# Patient Record
Sex: Male | Born: 1940 | Race: White | Hispanic: No | Smoking: Former smoker
Health system: Southern US, Community
[De-identification: ages and names within clinical notes are randomized; demographics above are authoritative.]

## PROBLEM LIST (undated history)

## (undated) DIAGNOSIS — Z86010 Personal history of colonic polyps: Secondary | ICD-10-CM

## (undated) DIAGNOSIS — Z9189 Other specified personal risk factors, not elsewhere classified: Secondary | ICD-10-CM

## (undated) DIAGNOSIS — R0609 Other forms of dyspnea: Secondary | ICD-10-CM

## (undated) DIAGNOSIS — H9190 Unspecified hearing loss, unspecified ear: Secondary | ICD-10-CM

## (undated) DIAGNOSIS — Z8673 Personal history of transient ischemic attack (TIA), and cerebral infarction without residual deficits: Secondary | ICD-10-CM

## (undated) DIAGNOSIS — R0600 Dyspnea, unspecified: Secondary | ICD-10-CM

## (undated) DIAGNOSIS — N400 Enlarged prostate without lower urinary tract symptoms: Secondary | ICD-10-CM

## (undated) DIAGNOSIS — K5909 Other constipation: Secondary | ICD-10-CM

## (undated) DIAGNOSIS — D509 Iron deficiency anemia, unspecified: Secondary | ICD-10-CM

## (undated) DIAGNOSIS — C61 Malignant neoplasm of prostate: Secondary | ICD-10-CM

## (undated) DIAGNOSIS — I5032 Chronic diastolic (congestive) heart failure: Secondary | ICD-10-CM

## (undated) DIAGNOSIS — Z7901 Long term (current) use of anticoagulants: Secondary | ICD-10-CM

## (undated) DIAGNOSIS — R339 Retention of urine, unspecified: Secondary | ICD-10-CM

## (undated) DIAGNOSIS — I4892 Unspecified atrial flutter: Secondary | ICD-10-CM

## (undated) DIAGNOSIS — M1990 Unspecified osteoarthritis, unspecified site: Secondary | ICD-10-CM

## (undated) DIAGNOSIS — N183 Chronic kidney disease, stage 3 unspecified: Secondary | ICD-10-CM

## (undated) DIAGNOSIS — R600 Localized edema: Secondary | ICD-10-CM

## (undated) DIAGNOSIS — I452 Bifascicular block: Secondary | ICD-10-CM

## (undated) DIAGNOSIS — L719 Rosacea, unspecified: Secondary | ICD-10-CM

## (undated) DIAGNOSIS — E538 Deficiency of other specified B group vitamins: Secondary | ICD-10-CM

## (undated) DIAGNOSIS — H409 Unspecified glaucoma: Secondary | ICD-10-CM

## (undated) DIAGNOSIS — R413 Other amnesia: Secondary | ICD-10-CM

## (undated) DIAGNOSIS — I10 Essential (primary) hypertension: Secondary | ICD-10-CM

## (undated) DIAGNOSIS — Z860101 Personal history of adenomatous and serrated colon polyps: Secondary | ICD-10-CM

## (undated) DIAGNOSIS — Z960 Presence of urogenital implants: Secondary | ICD-10-CM

## (undated) DIAGNOSIS — E119 Type 2 diabetes mellitus without complications: Secondary | ICD-10-CM

## (undated) DIAGNOSIS — I6789 Other cerebrovascular disease: Secondary | ICD-10-CM

## (undated) DIAGNOSIS — K219 Gastro-esophageal reflux disease without esophagitis: Secondary | ICD-10-CM

## (undated) DIAGNOSIS — Z978 Presence of other specified devices: Secondary | ICD-10-CM

## (undated) DIAGNOSIS — E785 Hyperlipidemia, unspecified: Secondary | ICD-10-CM

## (undated) DIAGNOSIS — I679 Cerebrovascular disease, unspecified: Secondary | ICD-10-CM

## (undated) HISTORY — DX: Chronic kidney disease, stage 3 unspecified: N18.30

## (undated) HISTORY — DX: Gastro-esophageal reflux disease without esophagitis: K21.9

## (undated) HISTORY — DX: Deficiency of other specified B group vitamins: E53.8

## (undated) HISTORY — DX: Essential (primary) hypertension: I10

## (undated) HISTORY — DX: Unspecified osteoarthritis, unspecified site: M19.90

## (undated) HISTORY — DX: Chronic kidney disease, stage 3 (moderate): N18.3

## (undated) HISTORY — DX: Hyperlipidemia, unspecified: E78.5

---
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
## 1983-09-09 HISTORY — PX: WISDOM TOOTH EXTRACTION: SHX21

## 1994-09-08 HISTORY — PX: OTHER SURGICAL HISTORY: SHX169

## 2011-05-09 ENCOUNTER — Ambulatory Visit (AMBULATORY_SURGERY_CENTER): Payer: Medicare Other | Admitting: *Deleted

## 2011-05-09 ENCOUNTER — Encounter: Payer: Self-pay | Admitting: Internal Medicine

## 2011-05-09 VITALS — Ht 69.0 in | Wt 226.3 lb

## 2011-05-09 DIAGNOSIS — Z1211 Encounter for screening for malignant neoplasm of colon: Secondary | ICD-10-CM

## 2011-05-09 MED ORDER — SUPREP BOWEL PREP KIT 17.5-3.13-1.6 GM/177ML PO SOLN: 1.0000 | Freq: Once | ORAL | Status: DC

## 2011-05-23 ENCOUNTER — Encounter: Payer: Self-pay | Admitting: Internal Medicine

## 2011-05-23 ENCOUNTER — Ambulatory Visit (AMBULATORY_SURGERY_CENTER): Payer: Medicare Other | Admitting: Internal Medicine

## 2011-05-23 VITALS — BP 136/79 | HR 82 | Temp 97.4°F | Resp 17 | Ht 69.0 in | Wt 226.0 lb

## 2011-05-23 DIAGNOSIS — D126 Benign neoplasm of colon, unspecified: Secondary | ICD-10-CM

## 2011-05-23 DIAGNOSIS — K5730 Diverticulosis of large intestine without perforation or abscess without bleeding: Secondary | ICD-10-CM

## 2011-05-23 DIAGNOSIS — Z1211 Encounter for screening for malignant neoplasm of colon: Secondary | ICD-10-CM

## 2011-05-23 DIAGNOSIS — D371 Neoplasm of uncertain behavior of stomach: Secondary | ICD-10-CM

## 2011-05-23 DIAGNOSIS — D375 Neoplasm of uncertain behavior of rectum: Secondary | ICD-10-CM

## 2011-05-23 DIAGNOSIS — K5289 Other specified noninfective gastroenteritis and colitis: Secondary | ICD-10-CM

## 2011-05-23 DIAGNOSIS — D378 Neoplasm of uncertain behavior of other specified digestive organs: Secondary | ICD-10-CM

## 2011-05-23 LAB — GLUCOSE, CAPILLARY
Glucose-Capillary: 127 mg/dL — ABNORMAL HIGH (ref 70–99)
Glucose-Capillary: 112 mg/dL — ABNORMAL HIGH (ref 70–99)

## 2011-05-23 MED ORDER — SODIUM CHLORIDE 0.9 % IV SOLN: 500.0000 mL | INTRAVENOUS | Status: DC

## 2011-05-23 NOTE — Patient Instructions (Signed)
   Please read the handouts given to you by your recovery room nurse.   You will receive the polyp results within 2 weeks in the mail.  Please increase the fiber in your diet due to your diverticulosis.   Resume all of your routine medications today.   Eat before you take your diabetes medications.   If you have any questions, please call us at [phone].   Thank-you for choosing [place] for your healthcare needs today.

## 2011-05-26 ENCOUNTER — Telehealth: Payer: Self-pay | Admitting: *Deleted

## 2011-05-26 NOTE — Telephone Encounter (Signed)
Message left to call us if necessary. 

## 2013-09-20 ENCOUNTER — Encounter: Payer: Self-pay | Admitting: Cardiovascular Disease

## 2013-09-20 ENCOUNTER — Ambulatory Visit (INDEPENDENT_AMBULATORY_CARE_PROVIDER_SITE_OTHER): Payer: Medicare Other | Admitting: Cardiovascular Disease

## 2013-09-20 VITALS — BP 140/76 | HR 103 | Ht 69.0 in | Wt 241.0 lb

## 2013-09-20 DIAGNOSIS — I4892 Unspecified atrial flutter: Secondary | ICD-10-CM

## 2013-09-20 MED ORDER — DILTIAZEM HCL ER COATED BEADS 180 MG PO CP24
180.0000 mg | ORAL_CAPSULE | Freq: Every day | ORAL | Status: DC
Start: 2013-09-20 — End: 2013-10-13

## 2013-09-20 MED ORDER — RIVAROXABAN 20 MG PO TABS: 20.0000 mg | ORAL_TABLET | Freq: Every day | ORAL | Status: DC

## 2013-09-20 NOTE — Progress Notes (Signed)
History of Present Illness: 73 yo male with history of DM, chronic kidney disease, HLD, HTN, OA, GERD, BPH and new diagnosis of atrial fibrillation/flutter who is referred today by Dr. [name] for management of his atrial arrythmias. He was seen by Dr. [name] on 09/14/13 and noted to be in atrial fibrillation. EKG today with atrial flutter, rate 103 bpm. He feels well overall. Some fatigue. NO chest pain or SOB. No awareness that his heart is irregular. No dizziness, near syncope or syncope. No prior cardiac conditions. He has noted his pulse to be elevated for 4 months but has not told anyone.   Primary Care Physician: Burnard Bunting   Past Medical History  Diagnosis Date  . Diabetes mellitus   . Hyperlipidemia   . Hypertension   . Enlarged prostate   . Arthritis     right hand  . GERD (gastroesophageal reflux disease)     Past Surgical History  Procedure Laterality Date  . Wisdom tooth extraction  1985  . Other surgical history  1996    reconstruction left foot  . Tonsillectomy  1951    Current Outpatient Prescriptions  Medication Sig Dispense Refill  . LANTUS 100 UNIT/ML injection Inject 35 Units into the skin every evening.       Marland Kitchen LEVEMIR 100 UNIT/ML injection INJECT 40 UNITS DAILY      . lisinopril-hydrochlorothiazide (PRINZIDE,ZESTORETIC) 20-12.5 MG per tablet Take 1 tablet by mouth 2 (two) times daily.       . metFORMIN (GLUCOPHAGE) 500 MG tablet Take 500 mg by mouth 2 (two) times daily with a meal.       . omeprazole (PRILOSEC) 20 MG capsule Take 20 mg by mouth daily.       . simvastatin (ZOCOR) 20 MG tablet Take 20 mg by mouth at bedtime.       . sulfamethoxazole-trimethoprim (BACTRIM DS) 800-160 MG per tablet Take 1 tablet by mouth daily.       . Tamsulosin HCl (FLOMAX) 0.4 MG CAPS Take 0.4 mg by mouth daily after supper.        No current facility-administered medications for this visit.    No Known Allergies  History   Social History  . Marital Status:  Single    Spouse Name: N/A    Number of Children: N/A  . Years of Education: N/A   Occupational History  . Not on file.   Social History Main Topics  . Smoking status: Former Smoker    Quit date: 05/09/1983  . Smokeless tobacco: Not on file  . Alcohol Use: No  . Drug Use: No  . Sexual Activity: Not on file   Other Topics Concern  . Not on file   Social History Narrative  . No narrative on file    Family History  Problem Relation Age of Onset  . Colon cancer Neg Hx   . Rectal cancer Neg Hx   . Stomach cancer Neg Hx     Review of Systems:  As stated in the HPI and otherwise negative.   BP 140/76  Pulse 103  Ht 5\' 9"  (1.753 m)  Wt 241 lb (109.317 kg)  BMI 35.57 kg/m2  Physical Examination: General: Well developed, well nourished, NAD HEENT: OP clear, mucus membranes moist SKIN: warm, dry. No rashes. Neuro: No focal deficits Musculoskeletal: Muscle strength 5/5 all ext Psychiatric: Mood and affect normal Neck: No JVD, no carotid bruits, no thyromegaly, no lymphadenopathy. Lungs:Clear bilaterally, no wheezes, rhonci, crackles Cardiovascular:  Irregular irregular. No murmurs, gallops or rubs. Abdomen:Soft. Bowel sounds present. Non-tender.  Extremities: No lower extremity edema. Pulses are 2 + in the bilateral DP/PT.  EKG: Atrial flutter, rate 103 bpm. RBBB. LAFB. Q-waves inferior leads.   Assessment and Plan:   1. Atrial flutter: Rate is elevated today. Will start Cardizem CD 180 mg po Qdaily for rate control. I have spent 20 minutes discussing the etiology of atrial fib/flutter and treatment options, risk of CVA. His creatinine clearance is 62. Will start Xarelto 20 mg po Qdaily. Will repeat CBC at next office visit in 4 weeks. Will arrange echo to assess LVEF and exclude structural heart disease. He is agreeable to this plan. If he does not convert to sinus, could consider cardioversion in 4 weeks as he does have some fatigue.

## 2013-09-20 NOTE — Patient Instructions (Addendum)
Your physician recommends that you schedule a follow-up appointment in:  4 weeks.  Your physician has requested that you have an echocardiogram. Echocardiography is a painless test that uses sound waves to create images of your heart. It provides your doctor with information about the size and shape of your heart and how well your heart's chambers and valves are working. This procedure takes approximately one hour. There are no restrictions for this procedure.   Your physician has recommended you make the following change in your medication:   Start Cardizem CD 180 mg by mouth daily. Start Xarelto 20 mg by mouth daily. Stop aspirin

## 2013-09-21 ENCOUNTER — Telehealth: Payer: Self-pay | Admitting: Cardiovascular Disease

## 2013-09-21 DIAGNOSIS — E785 Hyperlipidemia, unspecified: Secondary | ICD-10-CM

## 2013-09-21 NOTE — Telephone Encounter (Signed)
Darrell Mcclain pt's family member called because she states yesterday when she got pt's prescription filled  for Cardizem CD 180 mg,  the pharmacist advised pt not to take Zocor medication, because it is a level one  interaction with Cardizem. Pt would like to have MD to prescribes another statin medication.

## 2013-09-21 NOTE — Telephone Encounter (Signed)
New message   Need to discuss medication that was prescribe on yesterday.

## 2013-09-21 NOTE — Telephone Encounter (Signed)
Dr. Angelena Form has recommended for pt to change from Zocor to Atorvastatin 10 mg. When I called pt's daughter [name] to let her know MD's recommendations; She states that pt's PCP had called a prescription for Atorvastatin 20 mg once a day. Daughter suggested for [place]  to call [facility]  [place] to change the prescription from 20 to 10 mg. The pharmacist states that pt just went by [address] and got the prescription and did not take the dose change  . I left becky a message regarding this. I don't know if Dr. Angelena Form wants to leave pt's dose to  20 mg once a day.

## 2013-09-21 NOTE — Telephone Encounter (Signed)
Can we change him to atorvastatin 10 mg po QHS and stop the Zocor? Thanks, chris

## 2013-09-22 MED ORDER — ATORVASTATIN CALCIUM 20 MG PO TABS: 20.0000 mg | ORAL_TABLET | Freq: Every day | ORAL | Status: AC

## 2013-09-22 NOTE — Telephone Encounter (Signed)
Spoke with [name] and told her OK for pt to take Atorvastatin 20 mg daily. Will update medication list.

## 2013-09-22 NOTE — Telephone Encounter (Signed)
Ok to take 20 mg of Lipitor. cdm

## 2013-10-04 ENCOUNTER — Telehealth: Payer: Self-pay | Admitting: Cardiovascular Disease

## 2013-10-04 NOTE — Telephone Encounter (Signed)
Follow up ° °Pt returned call  °

## 2013-10-04 NOTE — Telephone Encounter (Signed)
Left message to call back  

## 2013-10-04 NOTE — Telephone Encounter (Signed)
New message   Start a new medication .    C/O sided effect from medication. Heart rate still high in am when he gets up.  Heart rate today 102

## 2013-10-06 ENCOUNTER — Ambulatory Visit (HOSPITAL_COMMUNITY): Payer: Medicare Other | Attending: Cardiovascular Disease | Admitting: Radiology

## 2013-10-06 DIAGNOSIS — I4892 Unspecified atrial flutter: Secondary | ICD-10-CM

## 2013-10-06 DIAGNOSIS — Z6834 Body mass index (BMI) 34.0-34.9, adult: Secondary | ICD-10-CM | POA: Insufficient documentation

## 2013-10-06 DIAGNOSIS — E669 Obesity, unspecified: Secondary | ICD-10-CM | POA: Insufficient documentation

## 2013-10-06 DIAGNOSIS — I079 Rheumatic tricuspid valve disease, unspecified: Secondary | ICD-10-CM | POA: Insufficient documentation

## 2013-10-06 DIAGNOSIS — E785 Hyperlipidemia, unspecified: Secondary | ICD-10-CM | POA: Insufficient documentation

## 2013-10-06 DIAGNOSIS — E119 Type 2 diabetes mellitus without complications: Secondary | ICD-10-CM | POA: Insufficient documentation

## 2013-10-06 DIAGNOSIS — I10 Essential (primary) hypertension: Secondary | ICD-10-CM | POA: Insufficient documentation

## 2013-10-06 NOTE — Progress Notes (Signed)
Echocardiogram performed.  

## 2013-10-06 NOTE — Telephone Encounter (Signed)
[name] stopped by today while Mr.Crable was having his echo done. She reported that his heart rate has been 100 in the Am. I spoke with her later and  she reported that his heart rate has been 100 in the Am and 75 to 100 in the Pm with a BP of about 140/80 by electronic machine. States that he feels better since starting the Cardizem. Has a follow up appointment with Dr.McAlhany next week. She would like to be called if there is any MD recommendations prior to the appointment at [phone].  [name] is the ex wife that comes with him to all of his appointments. Advised will forward message to Dr.McAlhany.

## 2013-10-06 NOTE — Telephone Encounter (Signed)
NO changes at this time. cdm

## 2013-10-07 NOTE — Telephone Encounter (Signed)
Spoke with [place] and gave her information from Dr. Angelena Form.

## 2013-10-13 ENCOUNTER — Encounter: Payer: Self-pay | Admitting: Cardiovascular Disease

## 2013-10-13 ENCOUNTER — Ambulatory Visit (INDEPENDENT_AMBULATORY_CARE_PROVIDER_SITE_OTHER): Payer: Medicare Other | Admitting: Cardiovascular Disease

## 2013-10-13 VITALS — BP 134/60 | HR 82 | Ht 69.0 in | Wt 243.0 lb

## 2013-10-13 DIAGNOSIS — I4892 Unspecified atrial flutter: Secondary | ICD-10-CM

## 2013-10-13 MED ORDER — DILTIAZEM HCL ER COATED BEADS 180 MG PO CP24: 180.0000 mg | ORAL_CAPSULE | Freq: Two times a day (BID) | ORAL | Status: DC

## 2013-10-13 NOTE — Patient Instructions (Signed)
You have a follow up appointment with Dr. Angelena Form on November 03, 2013 at [place] has recommended you make the following change in your medication:  Increase Cardizem CD to 180 mg by mouth twice daily

## 2013-10-13 NOTE — Progress Notes (Signed)
History of Present Illness: 73 yo male with history of DM, chronic kidney disease, HLD, HTN, OA, GERD, BPH and new diagnosis of atrial fibrillation/flutter who is here today for cardiac follow up. He was seen as a new patient 09/22/13 for management of his newly diagnosed atrial flutter. He was seen by Dr. [name] on 09/14/13 and noted to be in atrial fibrillation. EKG in our office 09/22/13 with atrial flutter, rate 103 bpm. Some fatigue. NO chest pain or SOB. No awareness that his heart is irregular. No dizziness, near syncope or syncope. No prior cardiac conditions. He had noted his pulse to be elevated for 4 months but has not told anyone. I started him on Cardizem for rate control, Xarelto for anti-coagulation and arranged an echo to assess LV function. Echo on 10/06/13 with normal LV size and function, no significant valvular disease.   He is here today for follow up. He has been feeling well. He has no chest pain or SOB. No palpitations.   Primary Care Physician: Burnard Bunting   Past Medical History  Diagnosis Date  . Diabetes mellitus   . Hyperlipidemia   . Hypertension   . Enlarged prostate   . Arthritis     right hand  . GERD (gastroesophageal reflux disease)   . Atrial flutter     Past Surgical History  Procedure Laterality Date  . Wisdom tooth extraction  1985  . Other surgical history  1996    reconstruction left foot  . Tonsillectomy  1951    Current Outpatient Prescriptions  Medication Sig Dispense Refill  . atorvastatin (LIPITOR) 20 MG tablet Take 1 tablet (20 mg total) by mouth daily.  30 tablet  3  . diltiazem (CARDIZEM CD) 180 MG 24 hr capsule Take 1 capsule (180 mg total) by mouth daily.  30 capsule  6  . LANTUS 100 UNIT/ML injection Inject 35 Units into the skin every evening.       Marland Kitchen LEVEMIR 100 UNIT/ML injection INJECT 40 UNITS DAILY      . lisinopril-hydrochlorothiazide (PRINZIDE,ZESTORETIC) 20-12.5 MG per tablet Take 1 tablet by mouth 2 (two) times  daily.       . metFORMIN (GLUCOPHAGE) 500 MG tablet Take 500 mg by mouth 2 (two) times daily with a meal.       . omeprazole (PRILOSEC) 20 MG capsule Take 20 mg by mouth daily.       . Rivaroxaban (XARELTO) 20 MG TABS tablet Take 1 tablet (20 mg total) by mouth daily with supper.  30 tablet  6  . sulfamethoxazole-trimethoprim (BACTRIM DS) 800-160 MG per tablet Take 1 tablet by mouth daily.       . Tamsulosin HCl (FLOMAX) 0.4 MG CAPS Take 0.4 mg by mouth daily after supper.        No current facility-administered medications for this visit.    No Known Allergies  History   Social History  . Marital Status: Single    Spouse Name: N/A    Number of Children: N/A  . Years of Education: N/A   Occupational History  . Not on file.   Social History Main Topics  . Smoking status: Former Smoker    Quit date: 05/09/1983  . Smokeless tobacco: Not on file  . Alcohol Use: No  . Drug Use: No  . Sexual Activity: Not on file   Other Topics Concern  . Not on file   Social History Narrative  . No narrative on file  Family History  Problem Relation Age of Onset  . Colon cancer Neg Hx   . Rectal cancer Neg Hx   . Stomach cancer Neg Hx   . CAD Neg Hx     Review of Systems:  As stated in the HPI and otherwise negative.   BP 134/60  Pulse 82  Ht 5\' 9"  (1.753 m)  Wt 243 lb (110.224 kg)  BMI 35.87 kg/m2  Physical Examination: General: Well developed, well nourished, NAD HEENT: OP clear, mucus membranes moist SKIN: warm, dry. No rashes. Neuro: No focal deficits Musculoskeletal: Muscle strength 5/5 all ext Psychiatric: Mood and affect normal Neck: No JVD, no carotid bruits, no thyromegaly, no lymphadenopathy. Lungs:Clear bilaterally, no wheezes, rhonci, crackles Cardiovascular: Irregular irregular. No murmurs, gallops or rubs. Abdomen:Soft. Bowel sounds present. Non-tender.  Extremities: No lower extremity edema. Pulses are 2 + in the bilateral DP/PT.  EKG: Atrial flutter,  rate   Echo 10/06/13: Left ventricle: The cavity size was normal. Wall thickness was increased in a pattern of mild LVH. Systolic function was normal. The estimated ejection fraction was in the range of 50% to 55%. There is akinesis of the basalinferior myocardium. - Aortic valve: Trivial regurgitation. - Aortic root: The aortic root was mildly dilated. - Ascending aorta: The ascending aorta was mildly dilated. - Left atrium: The atrium was moderately dilated.   Assessment and Plan:   1. Atrial flutter: Rate is controlled except in am.  Will increase Cardizem CD to 180 mg po BID for better rate control. Continue Xarelto for anti-coagulation. Will see back in 3 weeks and if still in atrial flutter, consider DCCV (he has only been anti-coagulated for 3 weeks. He will check and see if cataract surgery can be postponed.

## 2013-11-02 ENCOUNTER — Telehealth: Payer: Self-pay | Admitting: Cardiovascular Disease

## 2013-11-02 NOTE — Telephone Encounter (Signed)
Spoke with [name] and appointment made for pt to see Dr. Angelena Form on November 09, 2013 at 9:00

## 2013-11-02 NOTE — Telephone Encounter (Signed)
New problem   Pt need to speak to nurse concerning scheduling appt for next week. Please call pt.

## 2013-11-03 ENCOUNTER — Ambulatory Visit: Payer: Medicare Other | Admitting: Cardiovascular Disease

## 2013-11-09 ENCOUNTER — Ambulatory Visit (INDEPENDENT_AMBULATORY_CARE_PROVIDER_SITE_OTHER): Payer: Medicare Other | Admitting: Cardiovascular Disease

## 2013-11-09 ENCOUNTER — Encounter: Payer: Self-pay | Admitting: Cardiovascular Disease

## 2013-11-09 VITALS — BP 122/64 | HR 77 | Ht 69.0 in | Wt 247.0 lb

## 2013-11-09 DIAGNOSIS — I4892 Unspecified atrial flutter: Secondary | ICD-10-CM

## 2013-11-09 NOTE — Patient Instructions (Signed)
Your physician wants you to follow-up in:  3 months. You will receive a reminder letter in the mail two months in advance. If you don't receive a letter, please call our office to schedule the follow-up appointment.

## 2013-11-09 NOTE — Progress Notes (Signed)
History of Present Illness: 73 yo male with history of DM, chronic kidney disease, HLD, HTN, OA, GERD, BPH and new diagnosis of atrial fibrillation/flutter who is here today for cardiac follow up. He was seen as a new patient 09/22/13 for management of his newly diagnosed atrial flutter. He was seen by Dr. [name] on 09/14/13 and noted to be in atrial fibrillation. EKG in our office 09/22/13 with atrial flutter, rate 103 bpm. Some fatigue. NO chest pain or SOB. No awareness that his heart is irregular. No dizziness, near syncope or syncope. No prior cardiac conditions. He had noted his pulse to be elevated for 4 months but has not told anyone. I started him on Cardizem for rate control, Xarelto for anti-coagulation and arranged an echo to assess LV function. Echo on 10/06/13 with normal LV size and function, no significant valvular disease. I saw him on 10/13/13 and he was feeling well. He was still in atrial flutter with slight elevation of rates in the am so I increased his Cardizem CD to 180 mg po BID.   He is here today for follow up. He has been feeling well. He has no chest pain or SOB. No palpitations. He feels well. No bleeding issues.   Primary Care Physician: Burnard Bunting   Past Medical History  Diagnosis Date  . Diabetes mellitus   . Hyperlipidemia   . Hypertension   . Enlarged prostate   . Arthritis     right hand  . GERD (gastroesophageal reflux disease)   . Atrial flutter     Past Surgical History  Procedure Laterality Date  . Wisdom tooth extraction  1985  . Other surgical history  1996    reconstruction left foot  . Tonsillectomy  1951    Current Outpatient Prescriptions  Medication Sig Dispense Refill  . atorvastatin (LIPITOR) 20 MG tablet Take 1 tablet (20 mg total) by mouth daily.  30 tablet  3  . diltiazem (CARDIZEM CD) 180 MG 24 hr capsule Take 1 capsule (180 mg total) by mouth 2 (two) times daily.  60 capsule  6  . LEVEMIR 100 UNIT/ML injection INJECT 40  UNITS DAILY      . lisinopril-hydrochlorothiazide (PRINZIDE,ZESTORETIC) 20-12.5 MG per tablet Take 1 tablet by mouth 2 (two) times daily.       . metFORMIN (GLUCOPHAGE) 500 MG tablet Take 500 mg by mouth 2 (two) times daily with a meal.       . omeprazole (PRILOSEC) 20 MG capsule Take 20 mg by mouth daily.       . Rivaroxaban (XARELTO) 20 MG TABS tablet Take 1 tablet (20 mg total) by mouth daily with supper.  30 tablet  6  . sulfamethoxazole-trimethoprim (BACTRIM DS) 800-160 MG per tablet Take 1 tablet by mouth daily.       . Tamsulosin HCl (FLOMAX) 0.4 MG CAPS Take 0.4 mg by mouth daily after supper.        No current facility-administered medications for this visit.    No Known Allergies  History   Social History  . Marital Status: Single    Spouse Name: N/A    Number of Children: N/A  . Years of Education: N/A   Occupational History  . Not on file.   Social History Main Topics  . Smoking status: Former Smoker    Quit date: 05/09/1983  . Smokeless tobacco: Not on file  . Alcohol Use: No  . Drug Use: No  . Sexual Activity: Not  on file   Other Topics Concern  . Not on file   Social History Narrative  . No narrative on file    Family History  Problem Relation Age of Onset  . Colon cancer Neg Hx   . Rectal cancer Neg Hx   . Stomach cancer Neg Hx   . CAD Neg Hx     Review of Systems:  As stated in the HPI and otherwise negative.   BP 122/64  Pulse 77  Ht 5\' 9"  (1.753 m)  Wt 247 lb (112.038 kg)  BMI 36.46 kg/m2  Physical Examination: General: Well developed, well nourished, NAD HEENT: OP clear, mucus membranes moist SKIN: warm, dry. No rashes. Neuro: No focal deficits Musculoskeletal: Muscle strength 5/5 all ext Psychiatric: Mood and affect normal Neck: No JVD, no carotid bruits, no thyromegaly, no lymphadenopathy. Lungs:Clear bilaterally, no wheezes, rhonci, crackles Cardiovascular: Irregular irregular. No murmurs, gallops or rubs. Abdomen:Soft. Bowel  sounds present. Non-tender.  Extremities: No lower extremity edema. Pulses are 2 + in the bilateral DP/PT.  EKG: Atrial flutter. Rate 77 bpm. RBBB.   Echo 10/06/13: Left ventricle: The cavity size was normal. Wall thickness was increased in a pattern of mild LVH. Systolic function was normal. The estimated ejection fraction was in the range of 50% to 55%. There is akinesis of the basalinferior myocardium. - Aortic valve: Trivial regurgitation. - Aortic root: The aortic root was mildly dilated. - Ascending aorta: The ascending aorta was mildly dilated. - Left atrium: The atrium was moderately dilated.  Assessment and Plan:   1. Atrial flutter:  He is still in atrial flutter. His rate is controlled on the Cardizem 180mg  po BID. He is anti-coagulated on Xarelto. No bleeding issues. We have discussed DCCV but he is asymptomatic and does not wish to have DCCV.

## 2013-12-19 ENCOUNTER — Telehealth: Payer: Self-pay | Admitting: Cardiovascular Disease

## 2013-12-19 NOTE — Telephone Encounter (Signed)
Spoke with [name] who reports pt has swelling in feet and lower legs.  Started a few weeks ago. Saw primary care a month or so and was started on Lasix (she thinks 20 mg) daily for 5 days.  She reports this helped swelling a little but it has returned.  Pt does not weigh daily but [name] reports pt has gained weight.

## 2013-12-19 NOTE — Telephone Encounter (Signed)
If his LE edema has been managed in primary care, would have him call their office especially given his renal insufficiency. Thanks, chris

## 2013-12-19 NOTE — Telephone Encounter (Signed)
Left message to call back  

## 2013-12-19 NOTE — Telephone Encounter (Signed)
New problem   Pt has swollen feet and ankles and need to speak to nurse. Please call pt

## 2014-01-13 NOTE — Telephone Encounter (Signed)
Spoke with [place] and pt has since seen PA in Dr. Jacquiline Doe office and compression stockings ordered. Pt is seeing Dr. [name] today for follow up.

## 2014-02-28 ENCOUNTER — Ambulatory Visit (INDEPENDENT_AMBULATORY_CARE_PROVIDER_SITE_OTHER): Payer: Medicare Other | Admitting: Cardiovascular Disease

## 2014-02-28 ENCOUNTER — Encounter: Payer: Self-pay | Admitting: Cardiovascular Disease

## 2014-02-28 VITALS — BP 146/66 | HR 72 | Ht 69.0 in | Wt 255.0 lb

## 2014-02-28 DIAGNOSIS — I5033 Acute on chronic diastolic (congestive) heart failure: Secondary | ICD-10-CM | POA: Insufficient documentation

## 2014-02-28 DIAGNOSIS — R600 Localized edema: Secondary | ICD-10-CM

## 2014-02-28 DIAGNOSIS — I5032 Chronic diastolic (congestive) heart failure: Secondary | ICD-10-CM

## 2014-02-28 DIAGNOSIS — I10 Essential (primary) hypertension: Secondary | ICD-10-CM

## 2014-02-28 DIAGNOSIS — E785 Hyperlipidemia, unspecified: Secondary | ICD-10-CM

## 2014-02-28 DIAGNOSIS — R609 Edema, unspecified: Secondary | ICD-10-CM

## 2014-02-28 DIAGNOSIS — I4892 Unspecified atrial flutter: Secondary | ICD-10-CM

## 2014-02-28 MED ORDER — FUROSEMIDE 40 MG PO TABS: 40.0000 mg | ORAL_TABLET | Freq: Two times a day (BID) | ORAL | Status: DC

## 2014-02-28 NOTE — Patient Instructions (Signed)
You have a follow up appointment scheduled with Dr. Angelena Form for March 08, 2014 at 12:15  Your physician has recommended you make the following change in your medication:  Increase furosemide to 40 mg by mouth twice daily.   Limit fluid intake to 6 eight ounce glasses of liquid daily

## 2014-02-28 NOTE — Progress Notes (Signed)
History of Present Illness: 73 yo male with history of DM, chronic kidney disease, HLD, HTN, OA, GERD, BPH and new diagnosis of atrial fibrillation/flutter who is here today for cardiac follow up. He was seen as a new patient 09/22/13 for management of his newly diagnosed atrial flutter. He was seen by Dr. [name] on 09/14/13 and noted to be in atrial fibrillation. EKG in our office 09/22/13 with atrial flutter, rate 103 bpm. No awareness that his heart is irregular. He had noted his pulse to be elevated for 4 months. I started him on Cardizem for rate control, Xarelto for anti-coagulation and arranged an echo to assess LV function. Echo on 10/06/13 with normal LV size and function, no significant valvular disease. I saw him on 10/13/13 and he was feeling well. He was still in atrial flutter with slight elevation of rates in the am so I increased his Cardizem CD to 180 mg po BID.   He is here today for follow up. He has been feeling well. He has no chest pain or SOB. No palpitations. He feels well. No bleeding issues. His only complaint is LE edema. This is bilateral. Has been ongoing for several months. He has been seen in primary care and Lasix was increased to 40 mg daily. BMET was checked 2 weeks ago but I do not have these results. No significant improvement in edema.   Primary Care Physician: Burnard Bunting   Past Medical History  Diagnosis Date  . Diabetes mellitus   . Hyperlipidemia   . Hypertension   . Enlarged prostate   . Arthritis     right hand  . GERD (gastroesophageal reflux disease)   . Atrial flutter     Past Surgical History  Procedure Laterality Date  . Wisdom tooth extraction  1985  . Other surgical history  1996    reconstruction left foot  . Tonsillectomy  1951    Current Outpatient Prescriptions  Medication Sig Dispense Refill  . atorvastatin (LIPITOR) 20 MG tablet Take 1 tablet (20 mg total) by mouth daily.  30 tablet  3  . diltiazem (CARDIZEM CD) 180 MG 24  hr capsule Take 1 capsule (180 mg total) by mouth 2 (two) times daily.  60 capsule  6  . furosemide (LASIX) 40 MG tablet       . ketorolac (ACULAR) 0.5 % ophthalmic solution       . LEVEMIR 100 UNIT/ML injection INJECT 40 UNITS DAILY      . lisinopril-hydrochlorothiazide (PRINZIDE,ZESTORETIC) 20-12.5 MG per tablet Take 1 tablet by mouth 2 (two) times daily.       . metFORMIN (GLUCOPHAGE) 500 MG tablet Take 500 mg by mouth 2 (two) times daily with a meal.       . ofloxacin (OCUFLOX) 0.3 % ophthalmic solution       . omeprazole (PRILOSEC) 20 MG capsule Take 20 mg by mouth daily.       . Rivaroxaban (XARELTO) 20 MG TABS tablet Take 1 tablet (20 mg total) by mouth daily with supper.  30 tablet  6  . sulfamethoxazole-trimethoprim (BACTRIM DS) 800-160 MG per tablet Take 1 tablet by mouth daily.        No current facility-administered medications for this visit.    No Known Allergies  History   Social History  . Marital Status: Single    Spouse Name: N/A    Number of Children: N/A  . Years of Education: N/A   Occupational History  .  Not on file.   Social History Main Topics  . Smoking status: Former Smoker    Quit date: 05/09/1983  . Smokeless tobacco: Not on file  . Alcohol Use: No  . Drug Use: No  . Sexual Activity: Not on file   Other Topics Concern  . Not on file   Social History Narrative  . No narrative on file    Family History  Problem Relation Age of Onset  . Colon cancer Neg Hx   . Rectal cancer Neg Hx   . Stomach cancer Neg Hx   . CAD Neg Hx     Review of Systems:  As stated in the HPI and otherwise negative.   BP 146/66  Pulse 72  Ht 5\' 9"  (1.753 m)  Wt 255 lb (115.667 kg)  BMI 37.64 kg/m2  Physical Examination: General: Well developed, well nourished, NAD HEENT: OP clear, mucus membranes moist SKIN: warm, dry. No rashes. Neuro: No focal deficits Musculoskeletal: Muscle strength 5/5 all ext Psychiatric: Mood and affect normal Neck: No JVD, no  carotid bruits, no thyromegaly, no lymphadenopathy. Lungs:Clear bilaterally, no wheezes, rhonci, crackles Cardiovascular: Irregular irregular. No murmurs, gallops or rubs. Abdomen:Soft. Bowel sounds present. Non-tender.  Extremities: No lower extremity edema. Pulses are 2 + in the bilateral DP/PT.  EKG: Atrial flutter. Rate 72 bpm. RBBB.   Echo 10/06/13: Left ventricle: The cavity size was normal. Wall thickness was increased in a pattern of mild LVH. Systolic function was normal. The estimated ejection fraction was in the range of 50% to 55%. There is akinesis of the basalinferior myocardium. - Aortic valve: Trivial regurgitation. - Aortic root: The aortic root was mildly dilated. - Ascending aorta: The ascending aorta was mildly dilated. - Left atrium: The atrium was moderately dilated.  Assessment and Plan:   1. Atrial flutter:  He is still in atrial flutter. His rate is controlled on the Cardizem 180mg  po BID. He is anti-coagulated on Xarelto. No bleeding issues. We have discussed DCCV but he is asymptomatic and does not wish to have DCCV.   2. LE edema: Likely diastolic CHF. Will increase Lasix to 40 mg BID. Will check BMET and BNP today. I do not think this is related to Cardizem but may have to try changing to Lopressor if LE edema continues.   3. HTN: BP relatively well controlled. No changes  4. HLD: on a statin.

## 2014-03-01 ENCOUNTER — Telehealth: Payer: Self-pay

## 2014-03-01 LAB — BASIC METABOLIC PANEL
BUN: 33 mg/dL — ABNORMAL HIGH (ref 6–23)
CALCIUM: 9 mg/dL (ref 8.4–10.5)
CO2: 26 mEq/L (ref 19–32)
Chloride: 102 mEq/L (ref 96–112)
Creatinine, Ser: 1.7 mg/dL — ABNORMAL HIGH (ref 0.4–1.5)
GFR: 42.41 mL/min — AB (ref >60.00)
GLUCOSE: 227 mg/dL — AB (ref 70–99)
Potassium: 4.1 mEq/L (ref 3.5–5.1)
Sodium: 138 mEq/L (ref 135–145)

## 2014-03-01 LAB — BRAIN NATRIURETIC PEPTIDE: Pro B Natriuretic peptide (BNP): 79 pg/mL (ref 0.0–100.0)

## 2014-03-01 NOTE — Telephone Encounter (Signed)
Called patient to let him know that I placed samples  Up front for him

## 2014-03-02 ENCOUNTER — Telehealth: Payer: Self-pay | Admitting: Cardiovascular Disease

## 2014-03-02 NOTE — Telephone Encounter (Signed)
Spoke with [employer]. She is not sure when cramps started. I will call pt to discuss.  I placed call to pt and left message to call our office.

## 2014-03-02 NOTE — Telephone Encounter (Signed)
New message    Patient on lasix twice a day C/O cramp at night.

## 2014-03-06 NOTE — Telephone Encounter (Signed)
Follow up     Patient wife calling stating he has a eye appt 1:15 pm.

## 2014-03-06 NOTE — Telephone Encounter (Signed)
Wife calling stating Mr. Darrell Mcclain has been called in to see eye doctor on 7/1 at 1:15.  App w/Dr. [name] Handler is 12:15.  She just wanted to make sure Dr. [name] Handler was aware of eye appointment and to try to see him early.  Will make Darrell Mcclain aware.

## 2014-03-06 NOTE — Telephone Encounter (Signed)
Lm to call back

## 2014-03-07 NOTE — Telephone Encounter (Signed)
Spoke with [name] and appt changed to March 08, 2014 at 10:00.

## 2014-03-07 NOTE — Telephone Encounter (Signed)
Dr. Angelena Form can see pt at 10:00. I placed call to [facility] to give her this information. Left message to call back

## 2014-03-08 ENCOUNTER — Ambulatory Visit (INDEPENDENT_AMBULATORY_CARE_PROVIDER_SITE_OTHER): Payer: Medicare Other | Admitting: Cardiovascular Disease

## 2014-03-08 ENCOUNTER — Encounter: Payer: Self-pay | Admitting: Cardiovascular Disease

## 2014-03-08 ENCOUNTER — Ambulatory Visit: Payer: Medicare Other | Admitting: Cardiovascular Disease

## 2014-03-08 VITALS — BP 128/60 | HR 80 | Ht 69.0 in | Wt 248.0 lb

## 2014-03-08 DIAGNOSIS — R609 Edema, unspecified: Secondary | ICD-10-CM

## 2014-03-08 DIAGNOSIS — R600 Localized edema: Secondary | ICD-10-CM

## 2014-03-08 LAB — BASIC METABOLIC PANEL
BUN: 34 mg/dL — AB (ref 6–23)
CALCIUM: 9.2 mg/dL (ref 8.4–10.5)
CO2: 28 mEq/L (ref 19–32)
Chloride: 101 mEq/L (ref 96–112)
Creatinine, Ser: 1.8 mg/dL — ABNORMAL HIGH (ref 0.4–1.5)
GFR: 39.2 mL/min — ABNORMAL LOW (ref >60.00)
Glucose, Bld: 170 mg/dL — ABNORMAL HIGH (ref 70–99)
POTASSIUM: 3.8 meq/L (ref 3.5–5.1)
Sodium: 138 mEq/L (ref 135–145)

## 2014-03-08 NOTE — Progress Notes (Signed)
History of Present Illness: 73 yo male with history of DM, chronic kidney disease, HLD, HTN, OA, GERD, BPH and new diagnosis of atrial fibrillation/flutter who is here today for cardiac follow up. He was seen as a new patient 09/22/13 for management of his newly diagnosed atrial flutter. He was seen by Dr. [name] on 09/14/13 and noted to be in atrial fibrillation. EKG in our office 09/22/13 with atrial flutter, rate 103 bpm. No awareness that his heart is irregular. He had noted his pulse to be elevated for 4 months. I started him on Cardizem for rate control, Xarelto for anti-coagulation and arranged an echo to assess LV function. Echo on 10/06/13 with normal LV size and function, no significant valvular disease. I saw him on 10/13/13 and he was feeling well. He was still in atrial flutter with slight elevation of rates in the am so I increased his Cardizem CD to 180 mg po BID. He was seen here 02/28/14 and c/o LE edema. I increased his Lasix and checked a BMET which showed stable chronic kidney disease. (creat 1.7).   He is here today for follow up.  He is down 7 lbs over last week with increased dose of Lasix. His LE edema is improved. He feels great. No chest pain or SOB. He has cut back on his fluid intake over the last week.    Primary Care Physician: Burnard Bunting   Past Medical History  Diagnosis Date  . Diabetes mellitus   . Hyperlipidemia   . Hypertension   . Enlarged prostate   . Arthritis     right hand  . GERD (gastroesophageal reflux disease)   . Atrial flutter     Past Surgical History  Procedure Laterality Date  . Wisdom tooth extraction  1985  . Other surgical history  1996    reconstruction left foot  . Tonsillectomy  1951    Current Outpatient Prescriptions  Medication Sig Dispense Refill  . atorvastatin (LIPITOR) 20 MG tablet Take 1 tablet (20 mg total) by mouth daily.  30 tablet  3  . diltiazem (CARDIZEM CD) 180 MG 24 hr capsule Take 1 capsule (180 mg total) by  mouth 2 (two) times daily.  60 capsule  6  . furosemide (LASIX) 40 MG tablet Take 1 tablet (40 mg total) by mouth 2 (two) times daily.  60 tablet  3  . insulin aspart (NOVOLOG) 100 UNIT/ML injection Inject into the skin 3 (three) times daily before meals.      Marland Kitchen ketorolac (ACULAR) 0.5 % ophthalmic solution       . LEVEMIR 100 UNIT/ML injection INJECT 40 UNITS DAILY      . lisinopril (PRINIVIL,ZESTRIL) 20 MG tablet Take 20 mg by mouth daily.      . metFORMIN (GLUCOPHAGE) 500 MG tablet Take 500 mg by mouth 2 (two) times daily with a meal.       . ofloxacin (OCUFLOX) 0.3 % ophthalmic solution       . omeprazole (PRILOSEC) 20 MG capsule Take 20 mg by mouth daily.       . Rivaroxaban (XARELTO) 20 MG TABS tablet Take 1 tablet (20 mg total) by mouth daily with supper.  30 tablet  6  . sulfamethoxazole-trimethoprim (BACTRIM DS) 800-160 MG per tablet Take 1 tablet by mouth daily.       . tamsulosin (FLOMAX) 0.4 MG CAPS capsule        No current facility-administered medications for this visit.  No Known Allergies  History   Social History  . Marital Status: Single    Spouse Name: N/A    Number of Children: N/A  . Years of Education: N/A   Occupational History  . Not on file.   Social History Main Topics  . Smoking status: Former Smoker    Quit date: 05/09/1983  . Smokeless tobacco: Not on file  . Alcohol Use: No  . Drug Use: No  . Sexual Activity: Not on file   Other Topics Concern  . Not on file   Social History Narrative  . No narrative on file    Family History  Problem Relation Age of Onset  . Colon cancer Neg Hx   . Rectal cancer Neg Hx   . Stomach cancer Neg Hx   . CAD Neg Hx     Review of Systems:  As stated in the HPI and otherwise negative.   BP 128/60  Pulse 80  Ht 5\' 9"  (1.753 m)  Wt 248 lb (112.492 kg)  BMI 36.61 kg/m2  Physical Examination: General: Well developed, well nourished, NAD HEENT: OP clear, mucus membranes moist SKIN: warm, dry. No  rashes. Neuro: No focal deficits Musculoskeletal: Muscle strength 5/5 all ext Psychiatric: Mood and affect normal Neck: No JVD, no carotid bruits, no thyromegaly, no lymphadenopathy. Lungs:Clear bilaterally, no wheezes, rhonci, crackles Cardiovascular: Irregular irregular. No murmurs, gallops or rubs. Abdomen:Soft. Bowel sounds present. Non-tender.  Extremities: No lower extremity edema. Pulses are 2 + in the bilateral DP/PT.  EKG: Atrial flutter. Rate 72 bpm. RBBB.   Echo 10/06/13: Left ventricle: The cavity size was normal. Wall thickness was increased in a pattern of mild LVH. Systolic function was normal. The estimated ejection fraction was in the range of 50% to 55%. There is akinesis of the basalinferior myocardium. - Aortic valve: Trivial regurgitation. - Aortic root: The aortic root was mildly dilated. - Ascending aorta: The ascending aorta was mildly dilated. - Left atrium: The atrium was moderately dilated.  Assessment and Plan:   1. Atrial flutter:  He is still in atrial flutter. His rate is controlled on the Cardizem 180mg  po BID. He is anti-coagulated on Xarelto. No bleeding issues. We have discussed DCCV but he is asymptomatic and does not wish to have DCCV.   2. LE edema: Likely diastolic CHF. Improvement with increased dose of Lasix.  Will check BMET today. Will continue Lasix to 40 mg BID.   3. HTN: BP well controlled. No changes  4. HLD: on a statin. No changes.

## 2014-03-08 NOTE — Patient Instructions (Addendum)
Your physician recommends that you schedule a follow-up appointment in:6-8 weeks.  Scheduled for May 01, 2014 at 10:15

## 2014-03-24 NOTE — Telephone Encounter (Signed)
Placed samples back in closet

## 2014-04-06 ENCOUNTER — Encounter: Payer: Self-pay | Admitting: Internal Medicine

## 2014-05-01 ENCOUNTER — Encounter: Payer: Self-pay | Admitting: Cardiovascular Disease

## 2014-05-01 ENCOUNTER — Ambulatory Visit (INDEPENDENT_AMBULATORY_CARE_PROVIDER_SITE_OTHER): Payer: Medicare Other | Admitting: Cardiovascular Disease

## 2014-05-01 VITALS — BP 118/64 | HR 70 | Ht 69.0 in | Wt 246.4 lb

## 2014-05-01 DIAGNOSIS — R600 Localized edema: Secondary | ICD-10-CM

## 2014-05-01 DIAGNOSIS — I10 Essential (primary) hypertension: Secondary | ICD-10-CM

## 2014-05-01 DIAGNOSIS — R609 Edema, unspecified: Secondary | ICD-10-CM

## 2014-05-01 DIAGNOSIS — E785 Hyperlipidemia, unspecified: Secondary | ICD-10-CM

## 2014-05-01 DIAGNOSIS — I5032 Chronic diastolic (congestive) heart failure: Secondary | ICD-10-CM

## 2014-05-01 DIAGNOSIS — I4892 Unspecified atrial flutter: Secondary | ICD-10-CM

## 2014-05-01 MED ORDER — METOPROLOL SUCCINATE ER 50 MG PO TB24: 50.0000 mg | ORAL_TABLET | Freq: Two times a day (BID) | ORAL | Status: DC

## 2014-05-01 NOTE — Patient Instructions (Signed)
Your physician wants you to follow-up in:  6 months.  You will receive a reminder letter in the mail two months in advance. If you don't receive a letter, please call our office to schedule the follow-up appointment.  Your physician has recommended you make the following change in your medication:  Stop Diltiazem.  Start Toprol 50 mg by mouth twice daily.

## 2014-05-01 NOTE — Progress Notes (Signed)
History of Present Illness: 73 yo male with history of DM, chronic kidney disease, HLD, HTN, OA, GERD, BPH and atrial fibrillation/flutter who is here today for cardiac follow up. He was seen as a new patient 09/22/13 for management of his newly diagnosed atrial flutter. He was seen by Dr. [name] on 09/14/13 and noted to be in atrial fibrillation. EKG in our office 09/22/13 with atrial flutter, rate 103 bpm. No has had no awareness that his heart is irregular. He has been rated controlled on Cardizem and anti-coagulated with Xarelto. Echo on 10/06/13 with normal LV size and function, no significant valvular disease. He was seen here 02/28/14 and c/o LE edema. I increased his Lasix and he diuresed well.   He is here today for follow up.  He is down 2 more lbs over the last month. His LE edema is improved but still present. He thinks the Cardizem is contributing to this. He feels great overall. No chest pain or SOB.   Primary Care Physician: Burnard Bunting   Past Medical History  Diagnosis Date  . Diabetes mellitus   . Hyperlipidemia   . Hypertension   . Enlarged prostate   . Arthritis     right hand  . GERD (gastroesophageal reflux disease)   . Atrial flutter     Past Surgical History  Procedure Laterality Date  . Wisdom tooth extraction  1985  . Other surgical history  1996    reconstruction left foot  . Tonsillectomy  1951    Current Outpatient Prescriptions  Medication Sig Dispense Refill  . atorvastatin (LIPITOR) 20 MG tablet Take 1 tablet (20 mg total) by mouth daily.  30 tablet  3  . diltiazem (CARDIZEM CD) 180 MG 24 hr capsule Take 1 capsule (180 mg total) by mouth 2 (two) times daily.  60 capsule  6  . furosemide (LASIX) 40 MG tablet Take 1 tablet (40 mg total) by mouth 2 (two) times daily.  60 tablet  3  . insulin aspart (NOVOLOG) 100 UNIT/ML injection Inject into the skin 3 (three) times daily before meals.      Marland Kitchen LEVEMIR 100 UNIT/ML injection INJECT 40 UNITS DAILY        . lisinopril (PRINIVIL,ZESTRIL) 20 MG tablet Take 20 mg by mouth daily.      . metFORMIN (GLUCOPHAGE) 500 MG tablet Take 500 mg by mouth 2 (two) times daily with a meal.       . omeprazole (PRILOSEC) 20 MG capsule Take 20 mg by mouth daily.       . Rivaroxaban (XARELTO) 20 MG TABS tablet Take 1 tablet (20 mg total) by mouth daily with supper.  30 tablet  6  . sulfamethoxazole-trimethoprim (BACTRIM DS) 800-160 MG per tablet Take 1 tablet by mouth daily.       . tamsulosin (FLOMAX) 0.4 MG CAPS capsule Take 0.4 mg by mouth daily with breakfast.        No current facility-administered medications for this visit.    No Known Allergies  History   Social History  . Marital Status: Single    Spouse Name: N/A    Number of Children: N/A  . Years of Education: N/A   Occupational History  . Not on file.   Social History Main Topics  . Smoking status: Former Smoker    Quit date: 05/09/1983  . Smokeless tobacco: Not on file  . Alcohol Use: No  . Drug Use: No  . Sexual Activity: Not on  file   Other Topics Concern  . Not on file   Social History Narrative  . No narrative on file    Family History  Problem Relation Age of Onset  . Colon cancer Neg Hx   . Rectal cancer Neg Hx   . Stomach cancer Neg Hx   . CAD Neg Hx     Review of Systems:  As stated in the HPI and otherwise negative.   BP 118/64  Pulse 70  Ht 5\' 9"  (1.753 m)  Wt 246 lb 6.4 oz (111.766 kg)  BMI 36.37 kg/m2  Physical Examination: General: Well developed, well nourished, NAD HEENT: OP clear, mucus membranes moist SKIN: warm, dry. No rashes. Neuro: No focal deficits Musculoskeletal: Muscle strength 5/5 all ext Psychiatric: Mood and affect normal Neck: No JVD, no carotid bruits, no thyromegaly, no lymphadenopathy. Lungs:Clear bilaterally, no wheezes, rhonci, crackles Cardiovascular: Irregular irregular. No murmurs, gallops or rubs. Abdomen:Soft. Bowel sounds present. Non-tender.  Extremities: 1+ bilateral  lower extremity edema. Pulses are 2 + in the bilateral DP/PT.  Echo 10/06/13: Left ventricle: The cavity size was normal. Wall thickness was increased in a pattern of mild LVH. Systolic function was normal. The estimated ejection fraction was in the range of 50% to 55%. There is akinesis of the basalinferior myocardium. - Aortic valve: Trivial regurgitation. - Aortic root: The aortic root was mildly dilated. - Ascending aorta: The ascending aorta was mildly dilated. - Left atrium: The atrium was moderately dilated.  Assessment and Plan:   1. Atrial flutter:  Rate controlled on Diltiazem but he has continued swelling in his ankles and he wishes to stop the Diltiazem. Will stop Diltiazem and start Toprol XL 50 mg po BID. He is anti-coagulated on Xarelto. No bleeding issues. We have discussed DCCV but he is asymptomatic and does not wish to have DCCV.   2. Chronic diastolic CHF: Volume status stable on Lasix.  Will continue Lasix to 40 mg po QAM, 20 mg po QPM. Mainly LE edema. Hopefully this will improve with changing the Cardizem to Toprol.   3. HTN: BP well controlled. No changes  4. HLD: on a statin. No changes.

## 2014-05-02 ENCOUNTER — Telehealth: Payer: Self-pay | Admitting: Cardiovascular Disease

## 2014-05-02 NOTE — Telephone Encounter (Signed)
New message      Talk to a nurse regarding his medications

## 2014-05-02 NOTE — Telephone Encounter (Signed)
Spoke with [employer]. She was unable to attend pt's appt yesterday and is asking about changes in medication. I explained change in medications to her. She reports she saw pt today and he reports he has taken 2 doses of Toprol and feels tired today. I asked her to have pt continue medication and to let us know if fatigue continues. She reports pt fell against TV a few weeks ago and developed bruise on his abdomen. This is resolving and has been evaluated by primary care.

## 2014-08-23 ENCOUNTER — Other Ambulatory Visit: Payer: Self-pay | Admitting: Cardiovascular Disease

## 2014-09-08 HISTORY — PX: CATARACT EXTRACTION W/ INTRAOCULAR LENS  IMPLANT, BILATERAL: SHX1307

## 2014-11-07 ENCOUNTER — Ambulatory Visit (INDEPENDENT_AMBULATORY_CARE_PROVIDER_SITE_OTHER): Payer: Medicare Other | Admitting: Cardiovascular Disease

## 2014-11-07 ENCOUNTER — Encounter: Payer: Self-pay | Admitting: Cardiovascular Disease

## 2014-11-07 VITALS — BP 142/85 | HR 66 | Ht 69.0 in | Wt 257.0 lb

## 2014-11-07 DIAGNOSIS — I5032 Chronic diastolic (congestive) heart failure: Secondary | ICD-10-CM

## 2014-11-07 DIAGNOSIS — I10 Essential (primary) hypertension: Secondary | ICD-10-CM

## 2014-11-07 DIAGNOSIS — I4892 Unspecified atrial flutter: Secondary | ICD-10-CM

## 2014-11-07 DIAGNOSIS — E785 Hyperlipidemia, unspecified: Secondary | ICD-10-CM

## 2014-11-07 LAB — BASIC METABOLIC PANEL
BUN: 34 mg/dL — ABNORMAL HIGH (ref 6–23)
CO2: 32 meq/L (ref 19–32)
Calcium: 9.1 mg/dL (ref 8.4–10.5)
Chloride: 101 mEq/L (ref 96–112)
Creatinine, Ser: 1.71 mg/dL — ABNORMAL HIGH (ref 0.40–1.50)
GFR: 42.04 mL/min — AB (ref >60.00)
Glucose, Bld: 241 mg/dL — ABNORMAL HIGH (ref 70–99)
Potassium: 4.2 mEq/L (ref 3.5–5.1)
SODIUM: 137 meq/L (ref 135–145)

## 2014-11-07 MED ORDER — FUROSEMIDE 40 MG PO TABS: 40.0000 mg | ORAL_TABLET | Freq: Two times a day (BID) | ORAL | Status: DC

## 2014-11-07 NOTE — Progress Notes (Signed)
History of Present Illness: 74 yo male with history of DM, chronic kidney disease, HLD, HTN, OA, GERD, BPH and atrial fibrillation/flutter who is here today for cardiac follow up. He was seen as a new patient 09/22/13 for management of his newly diagnosed atrial flutter. He was seen by Dr. [name] on 09/14/13 and noted to be in atrial fibrillation. EKG in our office 09/22/13 with atrial flutter, rate 103 bpm. No has had no awareness that his heart is irregular. He has been rated controlled on Cardizem and anti-coagulated with Xarelto. Echo on 10/06/13 with normal LV size and function, no significant valvular disease. He was seen here 02/28/14 and c/o LE edema. I increased his Lasix and he diuresed well. His Cardizem was changed to Toprol.   He is here today for follow up.  No chest pain or SOB.   Primary Care Physician: Burnard Bunting   Past Medical History  Diagnosis Date  . Diabetes mellitus   . Hyperlipidemia   . Hypertension   . Enlarged prostate   . Arthritis     right hand  . GERD (gastroesophageal reflux disease)   . Atrial flutter     Past Surgical History  Procedure Laterality Date  . Wisdom tooth extraction  1985  . Other surgical history  1996    reconstruction left foot  . Tonsillectomy  1951    Current Outpatient Prescriptions  Medication Sig Dispense Refill  . atorvastatin (LIPITOR) 20 MG tablet Take 1 tablet (20 mg total) by mouth daily. 30 tablet 3  . furosemide (LASIX) 40 MG tablet TAKE 1 TABLET BY MOUTH TWICE DAILY 60 tablet 1  . insulin aspart (NOVOLOG) 100 UNIT/ML injection Inject into the skin 3 (three) times daily before meals.    . insulin glargine (LANTUS) 100 UNIT/ML injection Inject 40 Units into the skin daily.    Marland Kitchen lisinopril (PRINIVIL,ZESTRIL) 20 MG tablet Take 20 mg by mouth daily.    . metFORMIN (GLUCOPHAGE) 500 MG tablet Take 500 mg by mouth 2 (two) times daily with a meal.     . metoprolol succinate (TOPROL-XL) 50 MG 24 hr tablet Take 1 tablet  (50 mg total) by mouth 2 (two) times daily. Take with or immediately following a meal. 60 tablet 11  . omeprazole (PRILOSEC) 20 MG capsule Take 20 mg by mouth daily.     . Rivaroxaban (XARELTO) 20 MG TABS tablet Take 1 tablet (20 mg total) by mouth daily with supper. 30 tablet 6  . sulfamethoxazole-trimethoprim (BACTRIM DS) 800-160 MG per tablet Take 1 tablet by mouth daily.     . tamsulosin (FLOMAX) 0.4 MG CAPS capsule Take 0.4 mg by mouth daily with breakfast.      No current facility-administered medications for this visit.    No Known Allergies  History   Social History  . Marital Status: Single    Spouse Name: N/A  . Number of Children: N/A  . Years of Education: N/A   Occupational History  . Not on file.   Social History Main Topics  . Smoking status: Former Smoker    Quit date: 05/09/1983  . Smokeless tobacco: Not on file  . Alcohol Use: No  . Drug Use: No  . Sexual Activity: Not on file   Other Topics Concern  . Not on file   Social History Narrative    Family History  Problem Relation Age of Onset  . Colon cancer Neg Hx   . Rectal cancer Neg Hx   .  Stomach cancer Neg Hx   . CAD Neg Hx     Review of Systems:  As stated in the HPI and otherwise negative.   BP 142/85 mmHg  Pulse 66  Ht 5\' 9"  (1.753 m)  Wt 257 lb (116.574 kg)  BMI 37.93 kg/m2  Physical Examination: General: Well developed, well nourished, NAD HEENT: OP clear, mucus membranes moist SKIN: warm, dry. No rashes. Neuro: No focal deficits Musculoskeletal: Muscle strength 5/5 all ext Psychiatric: Mood and affect normal Neck: No JVD, no carotid bruits, no thyromegaly, no lymphadenopathy. Lungs:Clear bilaterally, no wheezes, rhonci, crackles Cardiovascular: Irregular irregular. No murmurs, gallops or rubs. Abdomen:Soft. Bowel sounds present. Non-tender.  Extremities: 1+ bilateral lower extremity edema. Pulses are 2 + in the bilateral DP/PT.  Echo 10/06/13: Left ventricle: The cavity size  was normal. Wall thickness was increased in a pattern of mild LVH. Systolic function was normal. The estimated ejection fraction was in the range of 50% to 55%. There is akinesis of the basalinferior myocardium. - Aortic valve: Trivial regurgitation. - Aortic root: The aortic root was mildly dilated. - Ascending aorta: The ascending aorta was mildly dilated. - Left atrium: The atrium was moderately dilated.  EKG: Atrial flutter, rate 66 bpm. RBBB  Assessment and Plan:   1. Atrial flutter:  Rate controlled on Toprol. He is anti-coagulated on Xarelto. No bleeding issues. We have discussed DCCV but he is asymptomatic and does not wish to have DCCV.   2. Chronic diastolic CHF: He is up 10 lbs over last 6 months. Not exercising and not watching what he is eating but still having some LE swelling. Will increase Lasix to 40 mg po BID.    3. HTN: BP controlled at home and at other office visits. No changes  4. HLD: He is on a statin. Lipids followed in primary care.

## 2014-11-07 NOTE — Patient Instructions (Signed)
Your physician wants you to follow-up in:  6 months. You will receive a reminder letter in the mail two months in advance. If you don't receive a letter, please call our office to schedule the follow-up appointment.  Your physician has recommended you make the following change in your medication:  Increase furosemide to 40 mg by mouth twice daily

## 2014-11-09 ENCOUNTER — Encounter: Payer: Self-pay | Admitting: *Deleted

## 2014-12-04 ENCOUNTER — Encounter: Payer: Self-pay | Admitting: Internal Medicine

## 2015-04-03 ENCOUNTER — Other Ambulatory Visit: Payer: Self-pay | Admitting: Cardiovascular Disease

## 2015-04-04 ENCOUNTER — Other Ambulatory Visit: Payer: Self-pay

## 2015-04-04 MED ORDER — METOPROLOL SUCCINATE ER 50 MG PO TB24: 50.0000 mg | ORAL_TABLET | Freq: Two times a day (BID) | ORAL | Status: DC

## 2015-08-09 ENCOUNTER — Telehealth: Payer: Self-pay | Admitting: Cardiovascular Disease

## 2015-08-09 NOTE — Telephone Encounter (Signed)
I spoke with the pt's ex-wife who is his care giver and she said the pt is SOB and "swollen all over".  I made her aware that we can see the pt in the office tomorrow. She will bring all of the pt's medications to appointment.

## 2015-08-09 NOTE — Telephone Encounter (Signed)
Pt c/o swelling: STAT is pt has developed SOB within 24 hours  1. How long have you been experiencing swelling? Few weeks  2. Where is the swelling located? Legs and feet  3.  Are you currently taking a "fluid pill"?yes  4.  Are you currently SOB? no  5.  Have you traveled recently?no

## 2015-08-10 ENCOUNTER — Encounter: Payer: Self-pay | Admitting: Cardiovascular Disease

## 2015-08-10 ENCOUNTER — Ambulatory Visit (INDEPENDENT_AMBULATORY_CARE_PROVIDER_SITE_OTHER): Payer: Medicare Other | Admitting: Cardiovascular Disease

## 2015-08-10 VITALS — BP 122/62 | HR 65 | Ht 69.0 in | Wt 267.4 lb

## 2015-08-10 DIAGNOSIS — N189 Chronic kidney disease, unspecified: Secondary | ICD-10-CM | POA: Diagnosis not present

## 2015-08-10 DIAGNOSIS — I4892 Unspecified atrial flutter: Secondary | ICD-10-CM | POA: Diagnosis not present

## 2015-08-10 DIAGNOSIS — E785 Hyperlipidemia, unspecified: Secondary | ICD-10-CM

## 2015-08-10 DIAGNOSIS — I5032 Chronic diastolic (congestive) heart failure: Secondary | ICD-10-CM

## 2015-08-10 DIAGNOSIS — I10 Essential (primary) hypertension: Secondary | ICD-10-CM

## 2015-08-10 LAB — BASIC METABOLIC PANEL
BUN: 33 mg/dL — ABNORMAL HIGH (ref 7–25)
CALCIUM: 8.6 mg/dL (ref 8.6–10.3)
CO2: 26 mmol/L (ref 20–31)
CREATININE: 1.92 mg/dL — AB (ref 0.70–1.18)
Chloride: 102 mmol/L (ref 98–110)
GLUCOSE: 264 mg/dL — AB (ref 65–99)
Potassium: 4.5 mmol/L (ref 3.5–5.3)
SODIUM: 139 mmol/L (ref 135–146)

## 2015-08-10 MED ORDER — FUROSEMIDE 40 MG PO TABS: ORAL_TABLET | ORAL | Status: DC

## 2015-08-10 NOTE — Progress Notes (Signed)
Chief Complaint  Patient presents with  . Follow-up    pt c/o swelling     History of Present Illness: 74 yo male with history of DM, chronic kidney disease, HLD, HTN, OA, GERD, BPH and atrial fibrillation/flutter who is here today for cardiac follow up. He was seen as a new patient 09/22/13 for management of his newly diagnosed atrial flutter. He was seen by Dr. [name] on 09/14/13 and noted to be in atrial fibrillation. EKG in our office 09/22/13 with atrial flutter, rate 103 bpm. No has had no awareness that his heart is irregular. He has been rated controlled on Cardizem and anti-coagulated with Xarelto. Echo on 10/06/13 with normal LV size and function, no significant valvular disease. He was seen here 02/28/14 and c/o LE edema. I increased his Lasix and he diuresed well. His Cardizem was changed to Toprol. At his last visit here in March 2016 his weight was up. His Lasix was increased to 40 mg po BID. His ex wife called our office yesterday to report that he was "swollen all over".   He is here today for cardiac evaluation. No chest pain or SOB. He notes increased weight and maybe slight worsening of his LE edema. He feels well overall. He has been taking his Lasix as prescribed twice daily.   Primary Care Physician: Burnard Bunting   Past Medical History  Diagnosis Date  . Diabetes mellitus   . Hyperlipidemia   . Hypertension   . Enlarged prostate   . Arthritis     right hand  . GERD (gastroesophageal reflux disease)   . Atrial flutter ([place])   . Chronic kidney disease (CKD), stage III (moderate)     Past Surgical History  Procedure Laterality Date  . Wisdom tooth extraction  1985  . Other surgical history  1996    reconstruction left foot  . Tonsillectomy  1951    Current Outpatient Prescriptions  Medication Sig Dispense Refill  . atorvastatin (LIPITOR) 20 MG tablet Take 1 tablet (20 mg total) by mouth daily. 30 tablet 3  . dorzolamide (TRUSOPT) 2 % ophthalmic solution  Place 1 drop into the left eye 2 (two) times daily.  3  . furosemide (LASIX) 40 MG tablet Take 2 tablets by mouth every morning and one tablet every afternoon 90 tablet 6  . insulin aspart (NOVOLOG) 100 UNIT/ML injection Inject into the skin 3 (three) times daily before meals.    . insulin glargine (LANTUS) 100 UNIT/ML injection Inject 42 Units into the skin daily.     Marland Kitchen lisinopril (PRINIVIL,ZESTRIL) 20 MG tablet Take 20 mg by mouth daily.    . metFORMIN (GLUCOPHAGE) 500 MG tablet Take 500 mg by mouth 2 (two) times daily with a meal.     . metoprolol succinate (TOPROL-XL) 50 MG 24 hr tablet Take 1 tablet (50 mg total) by mouth 2 (two) times daily. Take with or immediately following a meal. 60 tablet 11  . omeprazole (PRILOSEC) 20 MG capsule Take 20 mg by mouth daily.     . Rivaroxaban (XARELTO) 20 MG TABS tablet Take 1 tablet (20 mg total) by mouth daily with supper. 30 tablet 6  . sulfamethoxazole-trimethoprim (BACTRIM DS) 800-160 MG per tablet Take 1 tablet by mouth daily.     . tamsulosin (FLOMAX) 0.4 MG CAPS capsule Take 0.4 mg by mouth daily with breakfast.      No current facility-administered medications for this visit.    No Known Allergies  Social History  Social History  . Marital Status: Single    Spouse Name: N/A  . Number of Children: N/A  . Years of Education: N/A   Occupational History  . Not on file.   Social History Main Topics  . Smoking status: Former Smoker    Quit date: 05/09/1983  . Smokeless tobacco: Not on file  . Alcohol Use: No  . Drug Use: No  . Sexual Activity: Not on file   Other Topics Concern  . Not on file   Social History Narrative    Family History  Problem Relation Age of Onset  . Colon cancer Neg Hx   . Rectal cancer Neg Hx   . Stomach cancer Neg Hx   . CAD Neg Hx     Review of Systems:  As stated in the HPI and otherwise negative.   BP 122/62 mmHg  Pulse 65  Ht 5\' 9"  (1.753 m)  Wt 267 lb 6.4 oz (121.292 kg)  BMI 39.47 kg/m2   SpO2 95%  Physical Examination: General: Well developed, well nourished, NAD HEENT: OP clear, mucus membranes moist SKIN: warm, dry. No rashes. Neuro: No focal deficits Musculoskeletal: Muscle strength 5/5 all ext Psychiatric: Mood and affect normal Neck: No JVD, no carotid bruits, no thyromegaly, no lymphadenopathy. Lungs:Clear bilaterally, no wheezes, rhonci, crackles Cardiovascular: Irregular irregular. No murmurs, gallops or rubs. Abdomen:Soft. Bowel sounds present. Non-tender.  Extremities: 2+ bilateral lower extremity edema. Pulses are 2 + in the bilateral DP/PT.  Echo 10/06/13: Left ventricle: The cavity size was normal. Wall thickness was increased in a pattern of mild LVH. Systolic function was normal. The estimated ejection fraction was in the range of 50% to 55%. There is akinesis of the basalinferior myocardium. - Aortic valve: Trivial regurgitation. - Aortic root: The aortic root was mildly dilated. - Ascending aorta: The ascending aorta was mildly dilated. - Left atrium: The atrium was moderately dilated.  EKG:  EKG is ordered today. The ekg ordered today demonstrates Sinus brady, rate 65 bpm with PACs. RBBB. Old inferior Q waves.   Recent Labs: 11/07/2014: BUN 34*; Creatinine, Ser 1.71*; Potassium 4.2; Sodium 137   Lipid Panel No results found for: CHOL, TRIG, HDL, CHOLHDL, VLDL, LDLCALC, LDLDIRECT   Wt Readings from Last 3 Encounters:  08/10/15 267 lb 6.4 oz (121.292 kg)  11/07/14 257 lb (116.574 kg)  05/01/14 246 lb 6.4 oz (111.766 kg)     Other studies Reviewed: Additional studies/ records that were reviewed today include: . Review of the above records demonstrates:    Assessment and Plan:   1. Atrial flutter, paroxysmal:  Sinus today. Continue Toprol. He is anti-coagulated on Xarelto. No bleeding issues.   2. Acute on Chronic diastolic CHF: He has no dyspnea but he is clearly volume overloaded and I suspect he is minimizing his symptoms. Will  increase Lasix to 80 mg am, 40 mg pm. Will stop Norvasc. He will avoid salty foods and elevated legs during the day. He does not wish to wear compression stocking. Will check BMET today. I worry that we may not be able to diurese him well without affecting his renal function. Will check echo to assess LV function. Referral to Nephrology. I will see him back in one week with BMET.   3. HTN: BP controlled. No changes  4. HLD: He is on a statin. Lipids followed in primary care.   Current medicines are reviewed at length with the patient today.  The patient does not have concerns regarding medicines.  The following changes have been made:  no change  Labs/ tests ordered today include:   Orders Placed This Encounter  Procedures  . Basic Metabolic Panel (BMET)  . Ambulatory referral to Nephrology  . EKG 12-Lead  . Echocardiogram    Disposition:   FU with me in 1 week  Signed, Lauree Chandler, MD 08/10/2015 12:41 PM    [address], [place], [place]  [address] Phone: [phone]; Fax: [phone]

## 2015-08-10 NOTE — Patient Instructions (Signed)
Medication Instructions:  Your physician has recommended you make the following change in your medication:  Increase furosemide to 80 mg every morning and 40 mg every afternoon. Stop Norvasc   Labwork: Lab work to be done today--BMP  Testing/Procedures: Your physician has requested that you have an echocardiogram. Echocardiography is a painless test that uses sound waves to create images of your heart. It provides your doctor with information about the size and shape of your heart and how well your heart's chambers and valves are working. This procedure takes approximately one hour. There are no restrictions for this procedure.  You have been referred to Dr. Marval Regal [facility])   Follow-Up: Your physician recommends that you schedule a follow-up appointment in:  About 1 week.  Scheduled for December 12,2016 at 10:30   Keep feet elevated as much as possible. Decrease salt intake.      If you need a refill on your cardiac medications before your next appointment, please call your pharmacy.

## 2015-08-15 ENCOUNTER — Telehealth: Payer: Self-pay | Admitting: Cardiovascular Disease

## 2015-08-15 NOTE — Telephone Encounter (Signed)
New message     Returning a call to our office

## 2015-08-15 NOTE — Telephone Encounter (Signed)
Follow up      You do not have to return call from previous message.  It was a call to remind pt of his appt on Monday.

## 2015-08-20 ENCOUNTER — Ambulatory Visit (INDEPENDENT_AMBULATORY_CARE_PROVIDER_SITE_OTHER): Payer: Medicare Other | Admitting: Cardiovascular Disease

## 2015-08-20 ENCOUNTER — Encounter: Payer: Self-pay | Admitting: Cardiovascular Disease

## 2015-08-20 VITALS — BP 130/70 | HR 73 | Ht 69.0 in | Wt 259.0 lb

## 2015-08-20 DIAGNOSIS — E785 Hyperlipidemia, unspecified: Secondary | ICD-10-CM | POA: Diagnosis not present

## 2015-08-20 DIAGNOSIS — I5032 Chronic diastolic (congestive) heart failure: Secondary | ICD-10-CM | POA: Diagnosis not present

## 2015-08-20 DIAGNOSIS — I10 Essential (primary) hypertension: Secondary | ICD-10-CM

## 2015-08-20 DIAGNOSIS — I4892 Unspecified atrial flutter: Secondary | ICD-10-CM

## 2015-08-20 LAB — BASIC METABOLIC PANEL
BUN: 33 mg/dL — ABNORMAL HIGH (ref 7–25)
CHLORIDE: 102 mmol/L (ref 98–110)
CO2: 29 mmol/L (ref 20–31)
Calcium: 9.1 mg/dL (ref 8.6–10.3)
Creat: 1.72 mg/dL — ABNORMAL HIGH (ref 0.70–1.18)
Glucose, Bld: 178 mg/dL — ABNORMAL HIGH (ref 65–99)
POTASSIUM: 4.6 mmol/L (ref 3.5–5.3)
SODIUM: 142 mmol/L (ref 135–146)

## 2015-08-20 NOTE — Progress Notes (Signed)
Chief Complaint  Patient presents with  . Leg Swelling     History of Present Illness: 74 yo male with history of DM, chronic kidney disease, HLD, HTN, OA, GERD, BPH and atrial fibrillation/flutter who is here today for cardiac follow up. He was seen as a new patient 09/22/13 for management of his newly diagnosed atrial flutter. He was seen by Dr. [name] on 09/14/13 and noted to be in atrial fibrillation. EKG in our office 09/22/13 with atrial flutter, rate 103 bpm. No has had no awareness that his heart is irregular. He has been rated controlled on Cardizem and anti-coagulated with Xarelto. Echo on 10/06/13 with normal LV size and function, no significant valvular disease. He was seen here 02/28/14 and c/o LE edema. I increased his Lasix and he diuresed well. His Cardizem was changed to Toprol. At his last visit here in March 2016 his weight was up. His Lasix was increased to 40 mg po BID. He was seen in our office 10 days ago due to leg swelling. He denied chest pain or SOB. I increased his Lasix and stopped Norvasc. Echo was arranged but has not yet been performed.   He is here today for follow up. He is down 8 lbs on the higher dose of Lasix and after stopping the Norvasc. He is feeling well overall. No chest pain or SOB. He feels well overall. He has been taking his Lasix as prescribed twice daily. His ex-wife is here today with him and also reports daytime somnolence and fatigue. He snores at night. He denies all of this. She comes to his office visits but he rejects her help most of the time as well as her suggestions.   Primary Care Physician: Burnard Bunting   Past Medical History  Diagnosis Date  . Diabetes mellitus   . Hyperlipidemia   . Hypertension   . Enlarged prostate   . Arthritis     right hand  . GERD (gastroesophageal reflux disease)   . Atrial flutter ([place])   . Chronic kidney disease (CKD), stage III (moderate)     Past Surgical History  Procedure Laterality Date  .  Wisdom tooth extraction  1985  . Other surgical history  1996    reconstruction left foot  . Tonsillectomy  1951    Current Outpatient Prescriptions  Medication Sig Dispense Refill  . atorvastatin (LIPITOR) 20 MG tablet Take 1 tablet (20 mg total) by mouth daily. 30 tablet 3  . dorzolamide (TRUSOPT) 2 % ophthalmic solution Place 1 drop into the left eye 2 (two) times daily.  3  . furosemide (LASIX) 40 MG tablet Take 2 tablets by mouth every morning and one tablet every afternoon 90 tablet 6  . insulin aspart (NOVOLOG) 100 UNIT/ML injection Inject into the skin 3 (three) times daily before meals.    . insulin glargine (LANTUS) 100 UNIT/ML injection Inject 42 Units into the skin daily.     Marland Kitchen ketorolac (ACULAR) 0.5 % ophthalmic solution Place 1 drop into the right eye daily.  4  . lisinopril (PRINIVIL,ZESTRIL) 20 MG tablet Take 20 mg by mouth daily.    . metFORMIN (GLUCOPHAGE) 500 MG tablet Take 500 mg by mouth 2 (two) times daily with a meal.     . metoprolol succinate (TOPROL-XL) 50 MG 24 hr tablet Take 1 tablet (50 mg total) by mouth 2 (two) times daily. Take with or immediately following a meal. 60 tablet 11  . omeprazole (PRILOSEC) 20 MG capsule Take 20  mg by mouth daily.     . Rivaroxaban (XARELTO) 20 MG TABS tablet Take 1 tablet (20 mg total) by mouth daily with supper. 30 tablet 6  . sulfamethoxazole-trimethoprim (BACTRIM DS) 800-160 MG per tablet Take 1 tablet by mouth daily.     . tamsulosin (FLOMAX) 0.4 MG CAPS capsule Take 0.4 mg by mouth daily with breakfast.      No current facility-administered medications for this visit.    No Known Allergies  Social History   Social History  . Marital Status: Single    Spouse Name: N/A  . Number of Children: N/A  . Years of Education: N/A   Occupational History  . Not on file.   Social History Main Topics  . Smoking status: Former Smoker    Quit date: 05/09/1983  . Smokeless tobacco: Not on file  . Alcohol Use: No  . Drug Use:  No  . Sexual Activity: Not on file   Other Topics Concern  . Not on file   Social History Narrative    Family History  Problem Relation Age of Onset  . Colon cancer Neg Hx   . Rectal cancer Neg Hx   . Stomach cancer Neg Hx   . CAD Neg Hx     Review of Systems:  As stated in the HPI and otherwise negative.   BP 130/70 mmHg  Pulse 73  Ht 5\' 9"  (1.753 m)  Wt 259 lb (117.482 kg)  BMI 38.23 kg/m2  SpO2 97%  Physical Examination: General: Well developed, well nourished, NAD HEENT: OP clear, mucus membranes moist SKIN: warm, dry. No rashes. Neuro: No focal deficits Musculoskeletal: Muscle strength 5/5 all ext Psychiatric: Mood and affect normal Neck: No JVD, no carotid bruits, no thyromegaly, no lymphadenopathy. Lungs:Clear bilaterally, no wheezes, rhonci, crackles Cardiovascular: Irregular irregular. No murmurs, gallops or rubs. Abdomen:Soft. Bowel sounds present. Non-tender.  Extremities: 1+ bilateral lower extremity edema. Pulses are 2 + in the bilateral DP/PT.  Echo 10/06/13: Left ventricle: The cavity size was normal. Wall thickness was increased in a pattern of mild LVH. Systolic function was normal. The estimated ejection fraction was in the range of 50% to 55%. There is akinesis of the basalinferior myocardium. - Aortic valve: Trivial regurgitation. - Aortic root: The aortic root was mildly dilated. - Ascending aorta: The ascending aorta was mildly dilated. - Left atrium: The atrium was moderately dilated.  EKG:  EKG is not ordered today. The ekg ordered today demonstrates   Recent Labs: 08/10/2015: BUN 33*; Creat 1.92*; Potassium 4.5; Sodium 139   Lipid Panel No results found for: CHOL, TRIG, HDL, CHOLHDL, VLDL, LDLCALC, LDLDIRECT   Wt Readings from Last 3 Encounters:  08/20/15 259 lb (117.482 kg)  08/10/15 267 lb 6.4 oz (121.292 kg)  11/07/14 257 lb (116.574 kg)     Other studies Reviewed: Additional studies/ records that were reviewed today include:  . Review of the above records demonstrates:    Assessment and Plan:   1. Atrial flutter, paroxysmal:  Sinus today. Continue Toprol. He is anti-coagulated on Xarelto. No bleeding issues. Samples of Xarelto given today.   2. Acute on Chronic diastolic CHF: He has diuresed well with increased dose of Lasix. He is down 8 lbs. LE edema is better. Norvasc was also stopped. Will continue Lasix 80 mg am, 40 mg pm. He will avoid salty foods and elevated legs during the day. He does not wish to wear compression stocking. Will check BMET today. Will check echo to assess  LV function. Referral to Nephrology. I will see him back in 4 weeks with BMET. We also discussed a sleep study but he is refusing.   3. HTN: BP controlled. No changes  4. HLD: He is on a statin. Lipids followed in primary care.   Current medicines are reviewed at length with the patient today.  The patient does not have concerns regarding medicines.  The following changes have been made:  no change  Labs/ tests ordered today include:   Orders Placed This Encounter  Procedures  . Basic Metabolic Panel (BMET)    Disposition:   FU with me in 4-6 weeks.   Signed, Lauree Chandler, MD 08/20/2015 11:12 AM    [address], [place], [place]  [address] Phone: [phone]; Fax: [phone]

## 2015-08-20 NOTE — Patient Instructions (Addendum)
Medication Instructions:  Your physician recommends that you continue on your current medications as directed. Please refer to the Current Medication list given to you today.   Labwork: Lab work to be done today--BMP  Testing/Procedures: Your physician has requested that you have an echocardiogram. Echocardiography is a painless test that uses sound waves to create images of your heart. It provides your doctor with information about the size and shape of your heart and how well your heart's chambers and valves are working. This procedure takes approximately one hour. There are no restrictions for this procedure.  Scheduled for December 15,2016    Follow-Up: Your physician recommends that you schedule a follow-up appointment in: 4-6 weeks. --Scheduled for January 23,2017 at 9:15    Any Other Special Instructions Will Be Listed Below (If Applicable).     If you need a refill on your cardiac medications before your next appointment, please call your pharmacy.

## 2015-08-21 ENCOUNTER — Telehealth: Payer: Self-pay | Admitting: Cardiovascular Disease

## 2015-08-21 NOTE — Telephone Encounter (Signed)
New problem   Pt's wife returning your call.

## 2015-08-21 NOTE — Telephone Encounter (Signed)
Spoke with [place] and reviewed lab results from yesterday with her.

## 2015-08-23 ENCOUNTER — Other Ambulatory Visit: Payer: Self-pay

## 2015-08-23 ENCOUNTER — Ambulatory Visit (HOSPITAL_COMMUNITY): Payer: Medicare Other | Attending: Cardiology

## 2015-08-23 DIAGNOSIS — Z87891 Personal history of nicotine dependence: Secondary | ICD-10-CM | POA: Diagnosis not present

## 2015-08-23 DIAGNOSIS — Z6838 Body mass index (BMI) 38.0-38.9, adult: Secondary | ICD-10-CM | POA: Insufficient documentation

## 2015-08-23 DIAGNOSIS — I10 Essential (primary) hypertension: Secondary | ICD-10-CM | POA: Insufficient documentation

## 2015-08-23 DIAGNOSIS — I517 Cardiomegaly: Secondary | ICD-10-CM | POA: Diagnosis not present

## 2015-08-23 DIAGNOSIS — I509 Heart failure, unspecified: Secondary | ICD-10-CM | POA: Diagnosis present

## 2015-08-23 DIAGNOSIS — I5189 Other ill-defined heart diseases: Secondary | ICD-10-CM | POA: Insufficient documentation

## 2015-08-23 DIAGNOSIS — E119 Type 2 diabetes mellitus without complications: Secondary | ICD-10-CM | POA: Diagnosis not present

## 2015-08-23 DIAGNOSIS — E785 Hyperlipidemia, unspecified: Secondary | ICD-10-CM | POA: Insufficient documentation

## 2015-08-23 DIAGNOSIS — I5032 Chronic diastolic (congestive) heart failure: Secondary | ICD-10-CM | POA: Diagnosis not present

## 2015-08-23 HISTORY — PX: TRANSTHORACIC ECHOCARDIOGRAM: SHX275

## 2015-08-30 ENCOUNTER — Telehealth: Payer: Self-pay | Admitting: Cardiovascular Disease

## 2015-08-30 NOTE — Telephone Encounter (Signed)
New message  Pt wife called for ECHO results. Please call

## 2015-08-30 NOTE — Telephone Encounter (Signed)
EC states she just wanted to make sure the message from the nurse was old. Informed her that results were conveyed to patient's wife and the nurse made no note that she called again. Patient's contact grateful for callback.

## 2015-08-30 NOTE — Telephone Encounter (Signed)
Attempted to call wife back.  Line busy.  Will try again later.

## 2015-09-21 ENCOUNTER — Encounter: Payer: Self-pay | Admitting: Cardiovascular Disease

## 2015-09-28 ENCOUNTER — Telehealth: Payer: Self-pay | Admitting: Cardiovascular Disease

## 2015-09-28 NOTE — Telephone Encounter (Signed)
Spoke with [employer]. She reports [facility] contacted pt but pt did not schedule appt. She is asking if we would encourage him to contact [facility] when pt here for appt on 10/01/15.  I told [name] I would make Dr. Angelena Form aware and we would encourage pt to schedule appt.

## 2015-09-28 NOTE — Telephone Encounter (Signed)
New Message  Pt wife wanted Dr Angelena Form to discuss pt seeing a kidney specialist at upcoming appt- 10/01/15. Can call wife if need be. Please advise.

## 2015-10-01 ENCOUNTER — Other Ambulatory Visit: Payer: Self-pay | Admitting: *Deleted

## 2015-10-01 ENCOUNTER — Ambulatory Visit (INDEPENDENT_AMBULATORY_CARE_PROVIDER_SITE_OTHER): Payer: Medicare Other | Admitting: Cardiovascular Disease

## 2015-10-01 ENCOUNTER — Encounter: Payer: Self-pay | Admitting: Cardiovascular Disease

## 2015-10-01 VITALS — BP 132/72 | HR 70 | Ht 69.0 in | Wt 267.0 lb

## 2015-10-01 DIAGNOSIS — I10 Essential (primary) hypertension: Secondary | ICD-10-CM

## 2015-10-01 DIAGNOSIS — I483 Typical atrial flutter: Secondary | ICD-10-CM | POA: Diagnosis not present

## 2015-10-01 DIAGNOSIS — I5032 Chronic diastolic (congestive) heart failure: Secondary | ICD-10-CM

## 2015-10-01 DIAGNOSIS — E785 Hyperlipidemia, unspecified: Secondary | ICD-10-CM

## 2015-10-01 NOTE — Progress Notes (Signed)
Chief Complaint  Patient presents with  . Follow-up    diastolic chf     History of Present Illness: 75 yo male with history of DM, chronic kidney disease, HLD, HTN, OA, GERD, BPH and atrial fibrillation/flutter who is here today for cardiac follow up. He was seen as a new patient 09/22/13 for management of his newly diagnosed atrial flutter. He was seen by Dr. [name] on 09/14/13 and noted to be in atrial fibrillation. EKG in our office 09/22/13 with atrial flutter, rate 103 bpm. No has had no awareness that his heart is irregular. He has been rated controlled on Cardizem and anti-coagulated with Xarelto. Echo on 10/06/13 with normal LV size and function, no significant valvular disease. He was seen here 02/28/14 and c/o LE edema. I increased his Lasix and he diuresed well. His Cardizem was changed to Toprol. At his last visit here in March 2016 his weight was up. His Lasix was increased to 40 mg po BID. He was seen in our office 08/10/15 with worsened lower extremity edema. He denied chest pain or SOB. I increased his Lasix and stopped Norvasc. Echo 08/23/15 with normal LV systolic function, grade 1 diastolic function. No valve disease. He diuresed well on Lasix.   He is here today for follow up. He is feeling well overall. No chest pain or SOB. He has been taking his Lasix as prescribed twice daily with 80 mg in the am and 40 mg in the pm. His LE edema is improved. He has refused to see Nephrology as we recommended and he cancelled his appt with them when they called per his ex wife's report.    Primary Care Physician: Burnard Bunting   Past Medical History  Diagnosis Date  . Diabetes mellitus   . Hyperlipidemia   . Hypertension   . Enlarged prostate   . Arthritis     right hand  . GERD (gastroesophageal reflux disease)   . Atrial flutter ([place])   . Chronic kidney disease (CKD), stage III (moderate)     Past Surgical History  Procedure Laterality Date  . Wisdom tooth extraction  1985  .  Other surgical history  1996    reconstruction left foot  . Tonsillectomy  1951    Current Outpatient Prescriptions  Medication Sig Dispense Refill  . atorvastatin (LIPITOR) 20 MG tablet Take 1 tablet (20 mg total) by mouth daily. 30 tablet 3  . dorzolamide (TRUSOPT) 2 % ophthalmic solution Place 1 drop into the left eye 2 (two) times daily.  3  . furosemide (LASIX) 40 MG tablet Take 2 tablets by mouth every morning and one tablet every afternoon 90 tablet 6  . insulin aspart (NOVOLOG) 100 UNIT/ML injection Inject into the skin 3 (three) times daily before meals.    . insulin glargine (LANTUS) 100 UNIT/ML injection Inject 42 Units into the skin daily.     Marland Kitchen ketorolac (ACULAR) 0.5 % ophthalmic solution Place 1 drop into the right eye daily.  4  . lisinopril (PRINIVIL,ZESTRIL) 20 MG tablet Take 20 mg by mouth daily.    . metFORMIN (GLUCOPHAGE) 500 MG tablet Take 500 mg by mouth 2 (two) times daily with a meal.     . metoprolol succinate (TOPROL-XL) 50 MG 24 hr tablet Take 1 tablet (50 mg total) by mouth 2 (two) times daily. Take with or immediately following a meal. 60 tablet 11  . omeprazole (PRILOSEC) 20 MG capsule Take 20 mg by mouth daily.     Marland Kitchen  Rivaroxaban (XARELTO) 20 MG TABS tablet Take 1 tablet (20 mg total) by mouth daily with supper. 30 tablet 6  . sulfamethoxazole-trimethoprim (BACTRIM DS) 800-160 MG per tablet Take 1 tablet by mouth daily.     . tamsulosin (FLOMAX) 0.4 MG CAPS capsule Take 0.4 mg by mouth daily with breakfast.      No current facility-administered medications for this visit.    No Known Allergies  Social History   Social History  . Marital Status: Single    Spouse Name: N/A  . Number of Children: N/A  . Years of Education: N/A   Occupational History  . Not on file.   Social History Main Topics  . Smoking status: Former Smoker    Quit date: 05/09/1983  . Smokeless tobacco: Not on file  . Alcohol Use: No  . Drug Use: No  . Sexual Activity: Not on  file   Other Topics Concern  . Not on file   Social History Narrative    Family History  Problem Relation Age of Onset  . Colon cancer Neg Hx   . Rectal cancer Neg Hx   . Stomach cancer Neg Hx   . CAD Neg Hx     Review of Systems:  As stated in the HPI and otherwise negative.   BP 132/72 mmHg  Pulse 70  Ht 5\' 9"  (1.753 m)  Wt 267 lb (121.11 kg)  BMI 39.41 kg/m2  Physical Examination: General: Well developed, well nourished, NAD HEENT: OP clear, mucus membranes moist SKIN: warm, dry. No rashes. Neuro: No focal deficits Musculoskeletal: Muscle strength 5/5 all ext Psychiatric: Mood and affect normal Neck: No JVD, no carotid bruits, no thyromegaly, no lymphadenopathy. Lungs:Clear bilaterally, no wheezes, rhonci, crackles Cardiovascular: Irregular irregular. No murmurs, gallops or rubs. Abdomen:Soft. Bowel sounds present. Non-tender.  Extremities: 1+ bilateral lower extremity edema. Pulses are 2 + in the bilateral DP/PT.  Echo 08/23/15: Left ventricle: The cavity size was normal. Wall thickness was increased in a pattern of mild LVH. Systolic function was normal. The estimated ejection fraction was in the range of 55% to 60%. Wall motion was normal; there were no regional wall motion abnormalities. Doppler parameters are consistent with abnormal left ventricular relaxation (grade 1 diastolic dysfunction). Impressions: - Normal LV systolic function; mild LVH; grade 1 diastolic dysfunction.  EKG:  EKG is not ordered today. The ekg ordered today demonstrates   Recent Labs: 08/20/2015: BUN 33*; Creat 1.72*; Potassium 4.6; Sodium 142   Lipid Panel No results found for: CHOL, TRIG, HDL, CHOLHDL, VLDL, LDLCALC, LDLDIRECT   Wt Readings from Last 3 Encounters:  10/01/15 267 lb (121.11 kg)  08/20/15 259 lb (117.482 kg)  08/10/15 267 lb 6.4 oz (121.292 kg)     Other studies Reviewed: Additional studies/ records that were reviewed today include: . Review  of the above records demonstrates:    Assessment and Plan:   1. Atrial flutter, paroxysmal:  Sinus today. Continue Toprol. He is anti-coagulated on Xarelto. No bleeding issues.  2. Chronic diastolic CHF: He has diuresed well with increased dose of Lasix. Weight is fluctuating. LE edema is stable. Norvasc was also stopped. Will continue Lasix 80 mg am, 40 mg pm. He will avoid salty foods and will elevate legs during the day. He does not wish to wear compression stocking. Referral to Nephrology when he agrees. Will get most recent CMET from Dr. Jacquiline Doe office from last week. We also discussed a sleep study but he is refusing.   3. HTN:  BP controlled. No changes  4. HLD: He is on a statin. Lipids followed in primary care.   Current medicines are reviewed at length with the patient today.  The patient does not have concerns regarding medicines.  The following changes have been made:  no change  Labs/ tests ordered today include:   No orders of the defined types were placed in this encounter.    Disposition:   FU with me in 6 months.    Signed, Lauree Chandler, MD 10/01/2015 9:58 AM    [address], [place], [place]  [address] Phone: [phone]; Fax: [phone]

## 2015-10-01 NOTE — Patient Instructions (Signed)

## 2015-10-01 NOTE — Telephone Encounter (Signed)
Dr. Angelena Form encouraged pt to schedule appt with [facility] but pt does not want to schedule at this time. I spoke with pt who states it is OK with him if we continue to communicate with Gabriela Eves when she calls.

## 2015-11-09 HISTORY — PX: OTHER SURGICAL HISTORY: SHX169

## 2015-11-21 ENCOUNTER — Encounter: Payer: Self-pay | Admitting: Neurology

## 2015-11-21 ENCOUNTER — Ambulatory Visit (INDEPENDENT_AMBULATORY_CARE_PROVIDER_SITE_OTHER): Payer: Medicare Other | Admitting: Neurology

## 2015-11-21 VITALS — BP 122/50 | HR 72 | Ht 69.0 in

## 2015-11-21 DIAGNOSIS — R296 Repeated falls: Secondary | ICD-10-CM | POA: Insufficient documentation

## 2015-11-21 DIAGNOSIS — E538 Deficiency of other specified B group vitamins: Secondary | ICD-10-CM

## 2015-11-21 DIAGNOSIS — IMO0002 Reserved for concepts with insufficient information to code with codable children: Secondary | ICD-10-CM | POA: Insufficient documentation

## 2015-11-21 DIAGNOSIS — N189 Chronic kidney disease, unspecified: Secondary | ICD-10-CM

## 2015-11-21 DIAGNOSIS — E785 Hyperlipidemia, unspecified: Secondary | ICD-10-CM

## 2015-11-21 DIAGNOSIS — I10 Essential (primary) hypertension: Secondary | ICD-10-CM | POA: Diagnosis not present

## 2015-11-21 DIAGNOSIS — R413 Other amnesia: Secondary | ICD-10-CM

## 2015-11-21 DIAGNOSIS — F05 Delirium due to known physiological condition: Secondary | ICD-10-CM | POA: Diagnosis not present

## 2015-11-21 DIAGNOSIS — E1165 Type 2 diabetes mellitus with hyperglycemia: Secondary | ICD-10-CM | POA: Insufficient documentation

## 2015-11-21 DIAGNOSIS — W19XXXA Unspecified fall, initial encounter: Secondary | ICD-10-CM

## 2015-11-21 DIAGNOSIS — D631 Anemia in chronic kidney disease: Secondary | ICD-10-CM | POA: Diagnosis not present

## 2015-11-21 DIAGNOSIS — R410 Disorientation, unspecified: Secondary | ICD-10-CM

## 2015-11-21 NOTE — Patient Instructions (Addendum)
Remember to drink plenty of fluid, eat healthy meals and do not skip any meals. Try to eat protein with a every meal and eat a healthy snack such as fruit or nuts in between meals. Try to keep a regular sleep-wake schedule and try to exercise daily, particularly in the form of walking, 20-30 minutes a day, if you can.   As far as your medications are concerned, I would like to suggest: May consider Aricept  As far as diagnostic testing: MRI of the brain  I would like to see you back in 4 months, sooner if we need to. Please call us with any interim questions, concerns, problems, updates or refill requests.   Our phone number is [phone]. We also have an after hours call service for urgent matters and there is a physician on-call for urgent questions. For any emergencies you know to call 911 or go to the nearest emergency room

## 2015-11-21 NOTE — Progress Notes (Signed)
[facility]    Provider:  Dr Jaynee Eagles Referring Provider: Shaaron Adler M.D. Primary Care Physician:  Geoffery Lyons, MD  CC:  Increased confusion  HPI:  Darrell Mcclain is a 75 y.o. male here as a referral from Dr. [name] for increased confusion.PMHx morbid obesity, falls, chronic diastolic CHF, aflutter, DM, HLD, HTN, arthritis, CKD, controlled on Cardizem and anti-coagulated with Xarelto.Marland Kitchen He fell last week and fractured his left humerus. Patient says he does not have any memory problems but he says he lets people rattle. His x-wife provides most information. Memory problems in the last year per x-wife. He doesn't "get it". He got on the wrong road to go to his daughter's house but he noticed and changed. He has to write down conversations. He lives by himslef and his blood sugar was 500 when they found him. He lives alone. He does his own bills, hasn't missed any bills maybe one. Wife says he forgets things but patient denies and they argue a bit, they both talk at the same time, difficult interview. She says he will ask the same things over and over again, forgets appointments, but patient denies everything ex-wife says. She says he had an episode of confusion at a restaurant, he says he slipped on something, his glucose was 500 when he go tthe ED. He says he was missing pills at home and not taking his medications, x-wife says he wasn't eating well at home and was not taking care of his medications.  Unclear how long symptoms have been going on probably for some time her ex-wife. No inciting events. Slowly progressive. No other focal neurologic deficits. He has a referral to sleep studies.  Reviewed notes, labs and imaging from outside physicians, which showed: Patient is a 75 year old Caucasian male fell on the migraine on 11/07/2014 and sustained a left humerus fracture. Also with multiple contusions and abrasions to both knees. He went to the ED at [facility].  He underwent ORIF. To the left proximal humerus and left proximal biceps. He also sustained a concussion and results in the hospital showed no acute changes in his brain. [facility] for rehabilitation. Patient has an ambulatory referral to sleep studies.  Review of Systems: Patient complains of symptoms per HPI as well as the following symptoms: no cp, no sob. Pertinent negatives per HPI. All others negative.   Social History   Social History  . Marital Status: Single    Spouse Name: N/A  . Number of Children: N/A  . Years of Education: N/A   Occupational History  . Not on file.   Social History Main Topics  . Smoking status: Former Smoker    Quit date: 05/09/1983  . Smokeless tobacco: Not on file  . Alcohol Use: No  . Drug Use: No  . Sexual Activity: Not on file   Other Topics Concern  . Not on file   Social History Narrative   Lives at [facility] through rehab after falling and breaking right shoulder. Phone: [phone] Fax: [phone]          Family History  Problem Relation Age of Onset  . Colon cancer Neg Hx   . Rectal cancer Neg Hx   . Stomach cancer Neg Hx   . CAD Neg Hx   . Dementia Neg Hx     Past Medical History  Diagnosis Date  . Diabetes mellitus   . Hyperlipidemia   . Hypertension   .  Enlarged prostate   . Arthritis     right hand  . GERD (gastroesophageal reflux disease)   . Atrial flutter ([place])   . Chronic kidney disease (CKD), stage III (moderate)     Past Surgical History  Procedure Laterality Date  . Wisdom tooth extraction  1985  . Other surgical history  1996    reconstruction left foot  . Tonsillectomy  1951    Current Outpatient Prescriptions  Medication Sig Dispense Refill  . acetaminophen (TYLENOL) 325 MG tablet Take 650 mg by mouth every 4 (four) hours as needed.    Marland Kitchen amLODipine (NORVASC) 5 MG tablet Take 5 mg by mouth daily.    Marland Kitchen atorvastatin (LIPITOR) 20  MG tablet Take 1 tablet (20 mg total) by mouth daily. 30 tablet 3  . bisacodyl (DULCOLAX) 10 MG suppository Place 10 mg rectally at bedtime as needed for moderate constipation.    . diphenhydrAMINE (SOMINEX) 25 MG tablet Take 25 mg by mouth every 8 (eight) hours as needed for sleep.    . dorzolamide (TRUSOPT) 2 % ophthalmic solution Place 1 drop into the left eye 2 (two) times daily.  3  . furosemide (LASIX) 40 MG tablet Take 2 tablets by mouth every morning and one tablet every afternoon (Patient taking differently: Take 40 mg by mouth daily. Take 2 tablets by mouth every morning and one tablet every afternoon) 90 tablet 6  . HYDROcodone-acetaminophen (NORCO/VICODIN) 5-325 MG tablet Take 1 tablet by mouth every 4 (four) hours as needed for moderate pain.    Marland Kitchen insulin aspart (NOVOLOG) 100 UNIT/ML injection Inject 6 Units into the skin 3 (three) times daily before meals. 7am, 1230p, 530p    . insulin glargine (LANTUS) 100 UNIT/ML injection Inject 40 Units into the skin daily.     Marland Kitchen lisinopril (PRINIVIL,ZESTRIL) 20 MG tablet Take 20 mg by mouth daily.    . metFORMIN (GLUCOPHAGE) 500 MG tablet Take 500 mg by mouth 2 (two) times daily with a meal.     . metoprolol succinate (TOPROL-XL) 50 MG 24 hr tablet Take 1 tablet (50 mg total) by mouth 2 (two) times daily. Take with or immediately following a meal. (Patient taking differently: Take 50 mg by mouth daily. Take with or immediately following a meal.) 60 tablet 11  . omeprazole (PRILOSEC) 20 MG capsule Take 20 mg by mouth daily.     . polyethylene glycol (MIRALAX / GLYCOLAX) packet Take 17 g by mouth daily.    . promethazine (PHENERGAN) 12.5 MG tablet Take 12.5 mg by mouth every 4 (four) hours as needed for nausea or vomiting.    . tamsulosin (FLOMAX) 0.4 MG CAPS capsule Take 0.4 mg by mouth daily with breakfast.     . ketorolac (ACULAR) 0.5 % ophthalmic solution Place 1 drop into the right eye daily. Reported on 11/21/2015  4  . Rivaroxaban (XARELTO) 20  MG TABS tablet Take 1 tablet (20 mg total) by mouth daily with supper. (Patient not taking: Reported on 11/21/2015) 30 tablet 6  . sulfamethoxazole-trimethoprim (BACTRIM DS) 800-160 MG per tablet Take 1 tablet by mouth daily. Reported on 11/21/2015     No current facility-administered medications for this visit.    Allergies as of 11/21/2015  . (No Known Allergies)    Vitals: BP 122/50 mmHg  Pulse 72  Ht 5\' 9"  (1.753 m)  SpO2 96% Last Weight:  Wt Readings from Last 1 Encounters:  10/01/15 267 lb (121.11 kg)   Last Height:   Ht  Readings from Last 1 Encounters:  11/21/15 5\' 9"  (1.753 m)    Physical exam: Exam: Gen: NAD, conversant, well nourised, obese                   CV: RRR, no MRG. No Carotid Bruits. + bilateral No peripheral edema, warm, nontender Eyes: Conjunctivae clear without exudates or hemorrhage  Neuro: Detailed Neurologic Exam  Speech:    Speech is normal; fluent and spontaneous with normal comprehension.  Cognition: MMSE - Mini Mental State Exam 11/21/2015  Orientation to time 3  Orientation to Place 4  Registration 3  Attention/ Calculation 5  Recall 0  Language- name 2 objects 2  Language- repeat 1  Language- follow 3 step command 3  Language- read & follow direction 1  Write a sentence 1  Copy design 0  Total score 23      The patient is oriented to person, place, and time;   Cranial Nerves:    The pupils are equal, round, and reactive to light. Attempted funduscopic exam could not visualize.. Visual fields are full to finger confrontation. Extraocular movements are intact. Trigeminal sensation is intact and the muscles of mastication are normal. The face is symmetric. The palate elevates in the midline. Hearing intact. Voice is normal. Shoulder shrug is normal. The tongue has normal motion without fasciculations.   Coordination:    No dysmetria  Gait:    Difficulty getting out of wheelchair independently, morbidly obese in wheelchair, attempted  but declines walking time due to left arm surgery.  Motor Observation:    No asymmetry, no atrophy, and no involuntary movements noted. Tone:    Normal muscle tone.    Posture:    Posture is normal in wheelchair    Strength:    Strength is V/V in the upper and lower limbs except left shoulder s/p surgery could not test     Sensation: intact to LT     Reflex Exam:  DTR's:    Deep tendon reflexes in the upper are intact(could not test left arm due to recent surgery) and in the lower extremities are hyporeflexic bilaterally.   Toes:    The toes are equiv bilaterally.   Clonus:    Clonus is absent.      Assessment/Plan:   75 y.o. male here as a referral from Dr. [name] for increased confusion.PMHx morbid obesity, falls, chronic diastolic CHF, aflutter, DM, HLD, HTN, arthritis, CKD, controlled on Cardizem and anti-coagulated with Xarelto. Ex-wife who is his caretaker has brought patient not office because she is worried his memory problems and recently hit his head after a fall and is worried he has bleeding in his brain. Ex-wife and patient argue a little and talk at the same time, ex-wife says patient has memory problems but patient denies. MMSE is 23/30 which is decreased, and his peers he has not been taking good care of himself, by his own admission mixing up his medications and not taking them. He is a poor historian. We'll order an MRI of the brain, serum dementia panel. Can follow with patient in 4 months. Caretaker/X-wife insisting on Aricept. Patient wants to discuss Aricept with Dr. Joya Salm.  Sarina Ill, MD  [facility] [address] [place], [place] [address]  Phone [phone] Fax [phone]

## 2015-11-23 ENCOUNTER — Telehealth: Payer: Self-pay | Admitting: *Deleted

## 2015-11-23 LAB — THYROID PANEL WITH TSH
FREE THYROXINE INDEX: 2.6 (ref 1.2–4.9)
T3 UPTAKE RATIO: 35 % (ref 24–39)
T4 TOTAL: 7.4 ug/dL (ref 4.5–12.0)
TSH: 1.07 u[IU]/mL (ref 0.450–4.500)

## 2015-11-23 LAB — RPR: RPR Ser Ql: NONREACTIVE

## 2015-11-23 LAB — B12 AND FOLATE PANEL
FOLATE: 9.5 ng/mL (ref >3.0)
Vitamin B-12: 450 pg/mL (ref 211–946)

## 2015-11-23 LAB — HIV ANTIBODY (ROUTINE TESTING W REFLEX): HIV SCREEN 4TH GENERATION: NONREACTIVE

## 2015-11-23 LAB — METHYLMALONIC ACID, SERUM: METHYLMALONIC ACID: 237 nmol/L (ref 0–378)

## 2015-11-23 NOTE — Telephone Encounter (Signed)
Tried mobile/home number. Memory full. Cannot LVM. Tried work number. LVM for [name] (ok per DPR) to call about results. Gave [facility] phone number. OK to inform her labs normal per Dr Jaynee Eagles.

## 2015-11-23 NOTE — Telephone Encounter (Signed)
Called and spoke to [place]. Advised labs normal. Dr Jaynee Eagles did not test for syphillis. She understands. Advised MRI scheduled for 3/23. She will contact place where pt resides to let them know. Told her to call if she has any further questions.

## 2015-11-23 NOTE — Telephone Encounter (Signed)
Darrell Mcclain returned Darrell Mcclain's call, advised labs normal. Darrell Mcclain wants to know if HIV, Syphillis was normal. Please call to advise [phone].

## 2015-11-23 NOTE — Telephone Encounter (Signed)
-----   Message from Melvenia Beam, MD sent at 11/22/2015 10:19 PM EDT ----- Labs normal thanks

## 2015-11-29 ENCOUNTER — Ambulatory Visit
Admission: RE | Admit: 2015-11-29 | Discharge: 2015-11-29 | Disposition: A | Discharge status: Home / Self Care | Payer: No Typology Code available for payment source | Source: Ambulatory Visit | Attending: Neurology | Admitting: Neurology

## 2015-11-29 DIAGNOSIS — F05 Delirium due to known physiological condition: Secondary | ICD-10-CM | POA: Diagnosis not present

## 2015-11-29 DIAGNOSIS — R410 Disorientation, unspecified: Secondary | ICD-10-CM

## 2015-11-29 DIAGNOSIS — E538 Deficiency of other specified B group vitamins: Secondary | ICD-10-CM

## 2015-11-29 DIAGNOSIS — R413 Other amnesia: Secondary | ICD-10-CM

## 2015-12-03 ENCOUNTER — Telehealth: Payer: Self-pay | Admitting: *Deleted

## 2015-12-03 NOTE — Telephone Encounter (Signed)
LVM for [name] to call about results. Gave [facility] phone number.

## 2015-12-03 NOTE — Telephone Encounter (Signed)
[name] called back requesting to speak to Dr. Jaynee Eagles, please call [phone].

## 2015-12-03 NOTE — Telephone Encounter (Signed)
Dr Jaynee Eagles- please call, thank you

## 2015-12-03 NOTE — Telephone Encounter (Signed)
-----   Message from Melvenia Beam, MD sent at 12/02/2015  2:13 PM EDT ----- There is no bleeding in the brain, patient's caretaker was very worried about this. 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 White matter changes to this degree can cause memory impairment.  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 Again this is likely due his uncontrolled vascular risk factors such as his diabetes. He needs to manage his diabetes and other vascular risk factors better, as caretaker already know. thanks

## 2015-12-03 NOTE — Telephone Encounter (Signed)
Called and spoke to IXL (listed on DPR) about MRI results per Dr Jaynee Eagles note. She verbalized understanding. She would like a copy sent to where pt lives and she is going to pick up copy of results in office. I placed copy up front.

## 2015-12-03 NOTE — Telephone Encounter (Signed)
Sent copy office notes/MRI results to Dr Deatra Ina (referring provider) (fax: [phone]) and [facility] (fax: [phone]) where pt resides. Received fax confirmation for both.

## 2015-12-03 NOTE — Telephone Encounter (Signed)
Spoke with [place] regarding below. Reviewed MRI of the brain as below. He really needs to tightly control vascular risk factors. She acknowledges understanding

## 2015-12-10 ENCOUNTER — Telehealth: Payer: Self-pay | Admitting: Cardiovascular Disease

## 2015-12-10 NOTE — Telephone Encounter (Signed)
Informed patient's wife to speak with surgeon who did surgery to confirm ok to restart Xarelto. Explained from our standpoint patient should be taking medication but to get clearance to restart from surgeon. Pt's wife states that is what she thought, and will call surgeon's office.

## 2015-12-10 NOTE — Telephone Encounter (Signed)
New Message  Pt c/o medication issue: 1. Name of Medication: Xarelto    4. What is your medication issue? Broke his Shoulder. surgery. Just had the procedure. Pt has been in rehab for 3 weeks he is back home. Should he restart taking the Xarelto 20 mg. Please  Call

## 2015-12-10 NOTE — Telephone Encounter (Signed)
Follow up ° ° °Pt is returning call for rn  °

## 2015-12-10 NOTE — Telephone Encounter (Signed)
lmtcb

## 2015-12-10 NOTE — Telephone Encounter (Signed)
Pt's wife needs to leave another number-pls call [phone] to leave message

## 2016-01-07 ENCOUNTER — Ambulatory Visit (INDEPENDENT_AMBULATORY_CARE_PROVIDER_SITE_OTHER): Payer: Medicare Other | Admitting: Internal Medicine

## 2016-01-07 ENCOUNTER — Encounter: Payer: Self-pay | Admitting: Internal Medicine

## 2016-01-07 VITALS — BP 140/80 | HR 80 | Ht 69.0 in | Wt 249.6 lb

## 2016-01-07 DIAGNOSIS — R195 Other fecal abnormalities: Secondary | ICD-10-CM

## 2016-01-07 DIAGNOSIS — Z86010 Personal history of colonic polyps: Secondary | ICD-10-CM

## 2016-01-07 DIAGNOSIS — D631 Anemia in chronic kidney disease: Secondary | ICD-10-CM | POA: Diagnosis not present

## 2016-01-07 DIAGNOSIS — N189 Chronic kidney disease, unspecified: Secondary | ICD-10-CM | POA: Diagnosis not present

## 2016-01-07 NOTE — Patient Instructions (Signed)
Please follow up as needed 

## 2016-01-07 NOTE — Progress Notes (Signed)
HISTORY OF PRESENT ILLNESS:  Darrell Mcclain is a 75 y.o. 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 Patient has a history of adenomatous colon polyps and underwent colonoscopy in September 2012. Follow-up in 3 years recommended. Recall letter sent out in 2015 and 2016 without response. Since his last colonoscopy he has had progressive worsening of his overall health. The patient recently had difficulties with a traumatic fall recently and which she fractured shoulder requiring surgery. He was in a rehabilitation facility. He had significant bruising. They noticed that his low counts had drifted. I'm told that Hemoccult studies were performed and returned positive. The wife was told to act on this. This appointment made. There's been no gross bleeding. Patient is also seen a neurologist regarding memory issues and his nephrologist regarding his renal insufficiency. I have reviewed their office notes. He did perform outpatient Hemoccult studies recently with his PCP Dr. [name]. I have obtained an actual copy of the test which returned negative. GI review of systems is negative  REVIEW OF SYSTEMS:  All non-GI ROS negative except for shoulder pain, shortness of breath, difficulty sleeping, difficulty urinating  Past Medical History  Diagnosis Date  . Diabetes mellitus   . Hyperlipidemia   . Hypertension   . Enlarged prostate   . Arthritis     right hand  . GERD (gastroesophageal reflux disease)   . Atrial flutter ([place])   . Chronic kidney disease (CKD), stage III (moderate)   . Anemia   . Morbid obesity ([place])   . Vitamin B 12 deficiency     Past Surgical History  Procedure Laterality Date  . Wisdom tooth extraction  1985  . Other surgical history  1996    reconstruction left foot  .  Tonsillectomy  1951    Social History Sig Clisham  reports that he quit smoking about 32 years ago. He does not have any smokeless tobacco history on file. He reports that he does not drink alcohol or use illicit drugs.  family history is negative for Colon cancer, Rectal cancer, Stomach cancer, CAD, and Dementia.  No Known Allergies     PHYSICAL EXAMINATION: Vital signs: BP 140/80 mmHg  Pulse 80  Ht 5\' 9"  (1.753 m)  Wt 249 lb 9.6 oz (113.218 kg)  BMI 36.84 kg/m2  Constitutional: Chronically ill-appearing, markedly obese, no acute distress Psychiatric: alert and oriented x3, cooperative. However seems to demonstrate memory deficiency Eyes: extraocular movements intact, anicteric, conjunctiva pink Mouth: oral pharynx moist, no lesions Neck: supple but thick Lymph: no lymphadenopathy Cardiovascular: heart irregular rate and rhythm, soft systolic no murmur Lungs: clear to auscultation bilaterally Abdomen: soft, obese, nontender, nondistended, no obvious ascites, no peritoneal signs, normal bowel sounds, no organomegaly Rectal:Omitted Extremities: no clubbing or cyanosis. 1+ lower extremity edema bilaterally with chronic stasis changes Skin: no lesions on visible extremities in addition to chronic stasis changes Neuro: No focal deficits. Cranial nerves intact.  ASSESSMENT:  #1. Anemia. Multifactorial #2. Reports of Hemoccult-positive stool at outside facility without bleeding #3. Recent Hemoccult studies with PCP negative #4. History of adenomatous colon polyps 2012. Overdue for follow-up assuming medically fit  #5. Multiple significant medical problems. Not medically fit for colonoscopy  PLAN:  #1. I discussed with patient and his wife that he is not medically fit to undergo optical colonoscopy to evaluate Hemoccult-positive stool or provide colorectal neoplasia surveillance. I cannot exclude the  possibility of recurrent neoplasia, but again he is not appropriate candidate  this time for optical colonoscopy. Virtual colonoscopy is not an option either given his renal insufficiency. As such, I have recommended that he continue PPI therapy to protect his upper GI mucosal. The daily iron supplement would be reasonable. He will return to the care of his primary care physician multiple specialists.

## 2016-03-26 ENCOUNTER — Ambulatory Visit: Payer: Medicare Other | Admitting: Neurology

## 2016-04-08 ENCOUNTER — Ambulatory Visit: Payer: Medicare Other | Admitting: Neurology

## 2016-05-08 ENCOUNTER — Ambulatory Visit: Payer: Medicare Other | Admitting: Neurology

## 2016-05-10 ENCOUNTER — Other Ambulatory Visit: Payer: Self-pay | Admitting: Cardiovascular Disease

## 2016-05-10 DIAGNOSIS — I5032 Chronic diastolic (congestive) heart failure: Secondary | ICD-10-CM

## 2016-05-13 ENCOUNTER — Other Ambulatory Visit: Payer: Self-pay | Admitting: *Deleted

## 2016-05-13 DIAGNOSIS — I5032 Chronic diastolic (congestive) heart failure: Secondary | ICD-10-CM

## 2016-05-13 MED ORDER — FUROSEMIDE 40 MG PO TABS: ORAL_TABLET | ORAL | 1 refills | Status: DC

## 2016-05-20 ENCOUNTER — Ambulatory Visit (INDEPENDENT_AMBULATORY_CARE_PROVIDER_SITE_OTHER): Payer: Medicare Other | Admitting: Neurology

## 2016-05-20 ENCOUNTER — Encounter: Payer: Self-pay | Admitting: Neurology

## 2016-05-20 VITALS — BP 120/61 | HR 101 | Ht 69.0 in | Wt 247.2 lb

## 2016-05-20 DIAGNOSIS — G4710 Hypersomnia, unspecified: Secondary | ICD-10-CM

## 2016-05-20 DIAGNOSIS — R0681 Apnea, not elsewhere classified: Secondary | ICD-10-CM | POA: Diagnosis not present

## 2016-05-20 DIAGNOSIS — R0683 Snoring: Secondary | ICD-10-CM | POA: Diagnosis not present

## 2016-05-20 MED ORDER — DONEPEZIL HCL 10 MG PO TABS: 10.0000 mg | ORAL_TABLET | Freq: Every day | ORAL | 12 refills | Status: DC

## 2016-05-20 NOTE — Progress Notes (Signed)
[facility]   Provider:  Dr Jaynee Eagles Referring Provider: Shaaron Adler M.D. Primary Care Physician:  Geoffery Lyons, MD  CC:  Increased confusion  Interval history 05/20/2016: Here for follow up on memory loss. MRI of the brain significant for advanced chronic microvascular ischemic change making a vascular component to his memory loss a possibility. Serum dementia panel was unremarkable (RPR, HIV, TSH. B12). Discussed MRI of the brain and showed patient and wife his images showing advanced atherosclerosis as well as more advanced atrophy in the temporoparietal areas which I seen in an alzheimer's pathology. He snores and stops breathing and he sleeps all day. He stops breathing in the middle of the night per his x-wife. Sleeps all day.    MRI brain:  This MRI of the brain without contrast shows the following: 1.   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 Additionally, there are many single and confluent T2/FLAIR hyperintense foci throughout the hemispheres and brainstem consistent with advanced chronic microvascular ischemic change. 2.    Mild cortical atrophy  HPI:  Darrell Mcclain is a 75 y.o. male here as a referral from Dr. [name] for increased confusion.PMHx morbid obesity, falls, chronic diastolic CHF, aflutter, DM, HLD, HTN, arthritis, CKD, controlled on Cardizem and anti-coagulated with Xarelto.Marland Kitchen He fell last week and fractured his left humerus. Patient says he does not have any memory problems but he says he lets people rattle. His x-wife provides most information. Memory problems in the last year per x-wife. He doesn't "get it". He got on the wrong road to go to his daughter's house but he noticed and changed. He has to write down conversations. He lives by himslef and his blood sugar was 500 when they found him. He lives alone. He does his own bills, hasn't missed any bills maybe one. Wife says he  forgets things but patient denies and they argue a bit, they both talk at the same time, difficult interview. She says he will ask the same things over and over again, forgets appointments, but patient denies everything ex-wife says. She says he had an episode of confusion at a restaurant, he says he slipped on something, his glucose was 500 when he go tthe ED. He says he was missing pills at home and not taking his medications, x-wife says he wasn't eating well at home and was not taking care of his medications.  Unclear how long symptoms have been going on probably for some time her ex-wife. No inciting events. Slowly progressive. No other focal neurologic deficits. He has a referral to sleep studies.  Reviewed notes, labs and imaging from outside physicians, which showed: Patient is a 75 year old Caucasian male fell on the migraine on 11/07/2014 and sustained a left humerus fracture. Also with multiple contusions and abrasions to both knees. He went to the ED at [facility]. He underwent ORIF. To the left proximal humerus and left proximal biceps. He also sustained a concussion and results in the hospital showed no acute changes in his brain. [facility] for [facility]. Patient has an ambulatory referral to sleep studies.  Review of Systems: Patient complains of symptoms per HPI as well as the following symptoms: difficulty urinating. Pertinent negatives per HPI. All others negative.   Social History   Social History  . Marital status: Single    Spouse name: N/A  . Number of children: N/A  . Years  of education: N/A   Occupational History  . Not on file.   Social History Main Topics  . Smoking status: Former Smoker    Quit date: 05/09/1983  . Smokeless tobacco: Never Used  . Alcohol use No  . Drug use: No  . Sexual activity: Not on file   Other Topics Concern  . Not on file   Social History Narrative   Lives at  [place] through rehab after falling and breaking right shoulder. Phone: [phone] Fax: [phone]          Family History  Problem Relation Age of Onset  . Colon cancer Neg Hx   . Rectal cancer Neg Hx   . Stomach cancer Neg Hx   . CAD Neg Hx   . Dementia Neg Hx     Past Medical History:  Diagnosis Date  . Anemia   . Arthritis    right hand  . Atrial flutter ([place])   . Chronic kidney disease (CKD), stage III (moderate)   . Diabetes mellitus   . Enlarged prostate   . GERD (gastroesophageal reflux disease)   . Hyperlipidemia   . Hypertension   . Morbid obesity ([place])   . Vitamin B 12 deficiency     Past Surgical History:  Procedure Laterality Date  . OTHER SURGICAL HISTORY  1996   reconstruction left foot  . TONSILLECTOMY  1951  . WISDOM TOOTH EXTRACTION  1985    Current Outpatient Prescriptions  Medication Sig Dispense Refill  . acetaminophen (TYLENOL) 325 MG tablet Take 650 mg by mouth every 4 (four) hours as needed.    Marland Kitchen amLODipine (NORVASC) 5 MG tablet Take 5 mg by mouth daily.    Marland Kitchen atorvastatin (LIPITOR) 20 MG tablet Take 1 tablet (20 mg total) by mouth daily. 30 tablet 3  . dorzolamide (TRUSOPT) 2 % ophthalmic solution Place 1 drop into the left eye 2 (two) times daily.  3  . finasteride (PROSCAR) 5 MG tablet Take 5 mg by mouth daily.  3  . furosemide (LASIX) 40 MG tablet Take two (2) tablets (80 mg total) by mouth each morning and take one (1) tablet (40 mg total) by mouth each afternoon. 270 tablet 1  . insulin aspart (NOVOLOG) 100 UNIT/ML injection Inject 6 Units into the skin 3 (three) times daily before meals. 7am, 1230p, 530p    . insulin glargine (LANTUS) 100 UNIT/ML injection Inject 40 Units into the skin daily.     Marland Kitchen ketorolac (ACULAR) 0.5 % ophthalmic solution Place 1 drop into the right eye daily. Reported on 11/21/2015  4  . lisinopril (PRINIVIL,ZESTRIL) 20 MG tablet Take 20 mg by mouth daily.    . metFORMIN (GLUCOPHAGE) 500 MG  tablet Take 500 mg by mouth 2 (two) times daily with a meal.     . metoprolol succinate (TOPROL-XL) 50 MG 24 hr tablet Take 1 tablet (50 mg total) by mouth 2 (two) times daily. Take with or immediately following a meal. (Patient taking differently: Take 50 mg by mouth daily. Take with or immediately following a meal.) 60 tablet 11  . omeprazole (PRILOSEC) 20 MG capsule Take 20 mg by mouth daily.     . polyethylene glycol (MIRALAX / GLYCOLAX) packet Take 17 g by mouth daily.    . promethazine (PHENERGAN) 12.5 MG tablet Take 12.5 mg by mouth every 4 (four) hours as needed for nausea or vomiting.    . Rivaroxaban (XARELTO) 20 MG TABS tablet Take 1 tablet (20  mg total) by mouth daily with supper. 30 tablet 6  . sulfamethoxazole-trimethoprim (BACTRIM DS) 800-160 MG per tablet Take 1 tablet by mouth daily. Reported on 11/21/2015    . tamsulosin (FLOMAX) 0.4 MG CAPS capsule Take 0.4 mg by mouth daily with breakfast.      No current facility-administered medications for this visit.     Allergies as of 05/20/2016  . (No Known Allergies)    Vitals: BP 120/61 (BP Location: Left Arm, Patient Position: Sitting, Cuff Size: Normal)   Pulse (!) 101   Ht 5\' 9"  (1.753 m)   Wt 247 lb 3.2 oz (112.1 kg)   BMI 36.51 kg/m  Last Weight:  Wt Readings from Last 1 Encounters:  05/20/16 247 lb 3.2 oz (112.1 kg)   Last Height:   Ht Readings from Last 1 Encounters:  05/20/16 5\' 9"  (1.753 m)   MMSE - Mini Mental State Exam 05/20/2016 11/21/2015  Orientation to time 4 3  Orientation to Place 4 4  Registration 3 3  Attention/ Calculation 5 5  Recall 1 0  Language- name 2 objects 2 2  Language- repeat 1 1  Language- follow 3 step command 3 3  Language- read & follow direction 1 1  Write a sentence 1 1  Copy design 0 0  Total score 25 23   Cranial Nerves:    The pupils are equal, round, and reactive to light. Attempted funduscopic exam could not visualize.. Visual fields are full to finger confrontation.  Extraocular movements are intact. Trigeminal sensation is intact and the muscles of mastication are normal. The face is symmetric. The palate elevates in the midline. Hearing intact. Voice is normal. Shoulder shrug is normal. The tongue has normal motion without fasciculations.   Coordination:    No dysmetria  Gait:    Difficulty getting out of wheelchair independently, morbidly obese in wheelchair, attempted but declines walking time due to left arm surgery.  Motor Observation:    No asymmetry, no atrophy, and no involuntary movements noted. Tone:    Normal muscle tone.    Posture:    Posture is normal in wheelchair    Strength:    Strength is V/V in the upper and lower limbs except left shoulder s/p surgery could not test     Sensation: intact to LT     Reflex Exam:  DTR's:    Deep tendon reflexes in the upper are intact(could not test left arm due to recent surgery) and in the lower extremities are hyporeflexic bilaterally.   Toes:    The toes are equiv bilaterally.   Clonus:    Clonus is absent.      Assessment/Plan:   75 y.o. male here as a referral from Dr. [name] for increased confusion.PMHx morbid obesity, falls, chronic diastolic CHF, aflutter, DM, HLD, HTN, arthritis, CKD, controlled on Cardizem and anti-coagulated with Xarelto. Ex-wife who is his caretaker has brought patient because she is worried his memory problems. MMSE is 23-25/30, he has not been taking good care of himself, by his own admission mixing up his medications and not taking them.Discussed managing vascular risk factors. Discussed types of dementia and expectations for Aricept.    MRI of the brain significant for advanced chronic microvascular ischemic change making vascular dementia a possibility, also per my read appears he has more atrophy in the temporoparietal area . Serum dementia panel was unremarkable (RPR, HIV, TSH. B12)  Recommend sleep eval he likely has sleep apnea. Will order  sleep eval  for witnessed apneic events, snoring, morbid obesity,   Start Aricept. Discussed side effects as per instructions.     Assessment/Plan:    Sarina Ill, MD  [facility] [address] [place], [place] [address]  Phone [phone] Fax [phone]  A total of 40 minutes was spent face-to-face with this patient. Over half this time was spent on counseling patient on the memory loss diagnosis and different diagnostic and therapeutic options available.

## 2016-05-20 NOTE — Patient Instructions (Addendum)
Remember to drink plenty of fluid, eat healthy meals and do not skip any meals. Try to eat protein with a every meal and eat a healthy snack such as fruit or nuts in between meals. Try to keep a regular sleep-wake schedule and try to exercise daily, particularly in the form of walking, 20-30 minutes a day, if you can.   As far as your medications are concerned, I would like to suggest: Start Aricept 1/2 tab for 2 weeks then increase to a whole pill.   I would like to see you back in 6 months, sooner if we need to. Please call us with any interim questions, concerns, problems, updates or refill requests.   Please also call us for any test results so we can go over those with you on the phone.  My clinical assistant and will answer any of your questions and relay your messages to me and also relay most of my messages to you.   Our phone number is [phone]. We also have an after hours call service for urgent matters and there is a physician on-call for urgent questions. For any emergencies you know to call 911 or go to the nearest emergency room  Donepezil tablets What is this medicine? DONEPEZIL (doe NEP e zil) is used to treat mild to moderate dementia caused by Alzheimer's disease. This medicine may be used for other purposes; ask your health care provider or pharmacist if you have questions. What should I tell my health care provider before I take this medicine? They need to know if you have any of these conditions: -asthma or other lung disease -difficulty passing urine -head injury -heart disease -history of irregular heartbeat -liver disease -seizures (convulsions) -stomach or intestinal disease, ulcers or stomach bleeding -an unusual or allergic reaction to donepezil, other medicines, foods, dyes, or preservatives -pregnant or trying to get pregnant -breast-feeding How should I use this medicine? Take this medicine by mouth with a glass of water. Follow the directions on the  prescription label. You may take this medicine with or without food. Take this medicine at regular intervals. This medicine is usually taken before bedtime. Do not take it more often than directed. Continue to take your medicine even if you feel better. Do not stop taking except on your doctor's advice. If you are taking the 23 mg donepezil tablet, swallow it whole; do not cut, crush, or chew it. Talk to your pediatrician regarding the use of this medicine in children. Special care may be needed. Overdosage: If you think you have taken too much of this medicine contact a poison control center or emergency room at once. NOTE: This medicine is only for you. Do not share this medicine with others. What if I miss a dose? If you miss a dose, take it as soon as you can. If it is almost time for your next dose, take only that dose, do not take double or extra doses. What may interact with this medicine? Do not take this medicine with any of the following medications: -certain medicines for fungal infections like itraconazole, fluconazole, posaconazole, and voriconazole -cisapride -dextromethorphan; quinidine -dofetilide -dronedarone -pimozide -quinidine -thioridazine -ziprasidone This medicine may also interact with the following medications: -antihistamines for allergy, cough and cold -atropine -bethanechol -carbamazepine -certain medicines for bladder problems like oxybutynin, tolterodine -certain medicines for Parkinson's disease like benztropine, trihexyphenidyl -certain medicines for stomach problems like dicyclomine, hyoscyamine -certain medicines for travel sickness like scopolamine -dexamethasone -ipratropium -NSAIDs, medicines for pain and inflammation, like  ibuprofen or naproxen -other medicines for Alzheimer's disease -other medicines that prolong the QT interval (cause an abnormal heart rhythm) -phenobarbital -phenytoin -rifampin, rifabutin or rifapentine This list may not  describe all possible interactions. Give your health care provider a list of all the medicines, herbs, non-prescription drugs, or dietary supplements you use. Also tell them if you smoke, drink alcohol, or use illegal drugs. Some items may interact with your medicine. What should I watch for while using this medicine? Visit your doctor or health care professional for regular checks on your progress. Check with your doctor or health care professional if your symptoms do not get better or if they get worse. You may get drowsy or dizzy. Do not drive, use machinery, or do anything that needs mental alertness until you know how this drug affects you. What side effects may I notice from receiving this medicine? Side effects that you should report to your doctor or health care professional as soon as possible: -allergic reactions like skin rash, itching or hives, swelling of the face, lips, or tongue -changes in vision -feeling faint or lightheaded, falls -problems with balance -redness, blistering, peeling or loosening of the skin, including inside the mouth -slow heartbeat, or palpitations -stomach pain -unusual bleeding or bruising, red or purple spots on the skin -vomiting -weight loss Side effects that usually do not require medical attention (report to your doctor or health care professional if they continue or are bothersome): -diarrhea, especially when starting treatment -headache -indigestion or heartburn -loss of appetite -muscle cramps -nausea This list may not describe all possible side effects. Call your doctor for medical advice about side effects. You may report side effects to FDA at 1-800-FDA-1088. Where should I keep my medicine? Keep out of reach of children. Store at room temperature between 15 and 30 degrees C (59 and 86 degrees F). Throw away any unused medicine after the expiration date. NOTE: This sheet is a summary. It may not cover all possible information. If you have  questions about this medicine, talk to your doctor, pharmacist, or health care provider.    2016, Elsevier/Gold Standard. (2014-04-06 07:51:52)

## 2016-06-05 ENCOUNTER — Ambulatory Visit (INDEPENDENT_AMBULATORY_CARE_PROVIDER_SITE_OTHER): Payer: Medicare Other | Admitting: Neurology

## 2016-06-05 ENCOUNTER — Encounter: Payer: Self-pay | Admitting: Neurology

## 2016-06-05 DIAGNOSIS — G3183 Dementia with Lewy bodies: Secondary | ICD-10-CM | POA: Diagnosis not present

## 2016-06-05 DIAGNOSIS — F0280 Dementia in other diseases classified elsewhere without behavioral disturbance: Secondary | ICD-10-CM

## 2016-06-05 DIAGNOSIS — F0151 Vascular dementia with behavioral disturbance: Secondary | ICD-10-CM | POA: Diagnosis not present

## 2016-06-05 DIAGNOSIS — F02818 Dementia in other diseases classified elsewhere, unspecified severity, with other behavioral disturbance: Secondary | ICD-10-CM

## 2016-06-05 DIAGNOSIS — G301 Alzheimer's disease with late onset: Secondary | ICD-10-CM | POA: Diagnosis not present

## 2016-06-05 DIAGNOSIS — F0281 Dementia in other diseases classified elsewhere with behavioral disturbance: Secondary | ICD-10-CM | POA: Diagnosis not present

## 2016-06-05 NOTE — Progress Notes (Signed)
SLEEP MEDICINE CLINIC   Provider:  Larey Seat, M D  Referring Provider: Burnard Bunting, Mcclain Primary Care Physician:  Darrell Mcclain   HPI:  Darrell Mcclain is a 75 y.o. male , seen here as an internal sleep medicine referral from Darrell Mcclain,  PCP is Dr. Reynaldo Mcclain. He is accompanied by a male friend, not the former spouse.   Chief sleep complaint according to patient : " my ex -wife wanted me to come here"   Darrell Mcclain present in jovial mood, logorrhea and with difficulties to stay on task, answer questions, fill paper work, etc. Appears easily distracted. Has hearing loss, too.  Darrell Mcclain has commented on her MRI of the brain results which show advanced chronic microvascular changes making vascular dementia possibility but with  more atrophy in the temporal parietal area there is a higher risk for Alzheimer's dementia. The serum dementia panel was unremarkable.  She recommended a sleep evaluation as she thought he has multiple risk factors for sleep apnea including an elevated body mass index, a larger than usual neck size, and overall low muscle tone and he appears to have a higher respiratory rate at rest. His former spouse had also reported that she witnessed apneas and loud snoring , as well as  confusion that seemed to be worse at night. Some of this may well have been medication related, as the patient skipped sometimes doses and on other days doubled medication. She initiated Aricept. The patient had poorly controlled diabetes, was diagnosed at age 73. He has always been heavy. He has experienced weight gain, and loss of strength, and he has BPH with nocturia.   Sleep habits are as follows: The patient reports that she goes to bed around 10 PM but only if he is bored by the TV program, rarely later than 11 PM. He usually is asleep in less than 30 minutes and sometimes rather promptly. He falls asleep on his sides, either one. He described his bedroom is cool quiet  and dark, and usually he sleeps only on one pillow. Currently he is having a urinary catheter inserted, which has helped to reduce nocturnal urination. Before the catheter was used he went  3 or 4 times. He is no longer woken by the urge to urinate. He is not sure that he snores.  He usually wakes up spontaneously around 8 AM, and he would estimate his average sleep duration at about 8 hours total. He denies having a dry mouth in the morning, his ex wife reports snoring and apnea being present.   Sleep medical history and family sleep history: no history of OSA in his family.   HPI:  Darrell Mcclain is a 75 y.o. male here as a referral from Dr. Reynaldo Mcclain for increased confusion.PMHx morbid obesity, falls, chronic diastolic CHF, aflutter, DM, HLD, HTN, arthritis, CKD, controlled on Cardizem and anti-coagulated with Xarelto.Marland Kitchen He fell last week and fractured his left humerus. Patient says he does not have any memory problems but he says he lets people rattle. His x-wife provides most information. Memory problems in the last year per x-wife. He doesn't "get it". He got on the wrong road to go to his daughter's house but he noticed and changed. He has to write down conversations. He lives by himslef and his blood sugar was 500 when they found him. He lives alone. He does his own bills, hasn't missed any bills maybe one. Wife says he forgets things but patient denies and they argue  a bit, they both talk at the same time, difficult interview.  She says he will ask the same things over and over again, forgets appointments, but patient denies everything ex-wife says.  She says he had an episode of confusion at a restaurant, he says he slipped on something, his glucose was 500 when he got to the ED. He says he was missing pills at home and not taking his medications, x-wife says he wasn't eating well at home and was not taking care of his medications.  Unclear how long symptoms have been going on probably for some time  her ex-wife. No inciting events. Slowly progressive. No other focal neurologic deficits. He has a referral to sleep studies.  Reviewed notes, labs and imaging from outside physicians, which showed: Patient is a 75 year old Caucasian male fell on the migraine on 11/07/2014 and sustained a left humerus fracture. Also with multiple contusions and abrasions to both knees. He went to the ED at [facility]. He underwent ORIF. To the left proximal humerus and left proximal biceps. He also sustained a concussion and results in the hospital showed no acute changes in his brain. [facility] for rehabilitation. Patient has an ambulatory referral to sleep studies.   Review of Systems: Out of a complete 14 system review, the patient complains of only the following symptoms, and all other reviewed systems are negative.   Epworth score 4, Fatigue severity score 24  , depression score 2/15   Social History   Social History  . Marital status: Single    Spouse name: N/A  . Number of children: N/A  . Years of education: N/A   Occupational History  . Not on file.   Social History Main Topics  . Smoking status: Former Smoker    Quit date: 05/09/1983  . Smokeless tobacco: Never Used  . Alcohol use No  . Drug use: No  . Sexual activity: Not on file   Other Topics Concern  . Not on file   Social History Narrative   Lives at [facility] through rehab after falling and breaking right shoulder. Phone: [phone] Fax: [phone]          Family History  Problem Relation Age of Onset  . Colon cancer Neg Hx   . Rectal cancer Neg Hx   . Stomach cancer Neg Hx   . CAD Neg Hx   . Dementia Neg Hx     Past Medical History:  Diagnosis Date  . Anemia   . Arthritis    right hand  . Atrial flutter ([place])   . Chronic kidney disease (CKD), stage III (moderate)   . Diabetes mellitus   . Enlarged prostate   . GERD  (gastroesophageal reflux disease)   . Hyperlipidemia   . Hypertension   . Morbid obesity ([place])   . Vitamin B 12 deficiency     Past Surgical History:  Procedure Laterality Date  . OTHER SURGICAL HISTORY  1996   reconstruction left foot  . TONSILLECTOMY  1951  . WISDOM TOOTH EXTRACTION  1985    Current Outpatient Prescriptions  Medication Sig Dispense Refill  . acetaminophen (TYLENOL) 325 MG tablet Take 650 mg by mouth every 4 (four) hours as needed.    Marland Kitchen amLODipine (NORVASC) 5 MG tablet Take 5 mg by mouth daily.    Marland Kitchen atorvastatin (LIPITOR) 20 MG tablet Take 1 tablet (20 mg total) by mouth daily. 30 tablet 3  .  donepezil (ARICEPT) 10 MG tablet Take 1 tablet (10 mg total) by mouth at bedtime. 30 tablet 12  . dorzolamide (TRUSOPT) 2 % ophthalmic solution Place 1 drop into the left eye 2 (two) times daily.  3  . finasteride (PROSCAR) 5 MG tablet Take 5 mg by mouth daily.  3  . furosemide (LASIX) 40 MG tablet Take two (2) tablets (80 mg total) by mouth each morning and take one (1) tablet (40 mg total) by mouth each afternoon. 270 tablet 1  . insulin aspart (NOVOLOG) 100 UNIT/ML injection Inject 6 Units into the skin 3 (three) times daily before meals. 7am, 1230p, 530p    . insulin glargine (LANTUS) 100 UNIT/ML injection Inject 40 Units into the skin daily.     Marland Kitchen ketorolac (ACULAR) 0.5 % ophthalmic solution Place 1 drop into the right eye daily. Reported on 11/21/2015  4  . lisinopril (PRINIVIL,ZESTRIL) 20 MG tablet Take 20 mg by mouth daily.    . metFORMIN (GLUCOPHAGE) 500 MG tablet Take 500 mg by mouth 2 (two) times daily with a meal.     . metoprolol succinate (TOPROL-XL) 50 MG 24 hr tablet Take 1 tablet (50 mg total) by mouth 2 (two) times daily. Take with or immediately following a meal. (Patient taking differently: Take 50 mg by mouth daily. Take with or immediately following a meal.) 60 tablet 11  . omeprazole (PRILOSEC) 20 MG capsule Take 20 mg by mouth daily.     . polyethylene  glycol (MIRALAX / GLYCOLAX) packet Take 17 g by mouth daily.    . promethazine (PHENERGAN) 12.5 MG tablet Take 12.5 mg by mouth every 4 (four) hours as needed for nausea or vomiting.    . Rivaroxaban (XARELTO) 20 MG TABS tablet Take 1 tablet (20 mg total) by mouth daily with supper. 30 tablet 6  . sulfamethoxazole-trimethoprim (BACTRIM DS) 800-160 MG per tablet Take 1 tablet by mouth daily. Reported on 11/21/2015    . tamsulosin (FLOMAX) 0.4 MG CAPS capsule Take 0.4 mg by mouth daily with breakfast.      No current facility-administered medications for this visit.     Allergies as of 06/05/2016  . (No Known Allergies)    Vitals: There were no vitals taken for this visit. Last Weight:  Wt Readings from Last 1 Encounters:  05/20/16 247 lb 3.2 oz (112.1 kg)   MGQ:QPYPP is no height or weight on file to calculate BMI.     Last Height:   Ht Readings from Last 1 Encounters:  05/20/16 5\' 9"  (1.753 m)    Physical exam:  General: The patient is awake, alert and appears not in acute distress. The patient is well groomed. Head: Normocephalic, atraumatic. Neck is supple. Mallampati 3  neck circumference:18. Nasal airflow congested. Status post tonsil and adenoidectomy.  Cardiovascular:  Regular rate and rhythm , without  murmurs or carotid bruit, and without distended neck veins. Respiratory: Lungs are clear to auscultation. Skin:  Without evidence of edema, or rash Trunk: BMI is elevated . The patient's posture is hunched, pyknic   Neurologic exam : The patient is awake and alert, oriented to place and time.   Memory subjective described as intact !.  Memory testing revealed   MOCA:No flowsheet data found. MMSE: MMSE - Mini Mental State Exam 05/20/2016 11/21/2015  Orientation to time 4 3  Orientation to Place 4 4  Registration 3 3  Attention/ Calculation 5 5  Recall 1 0  Language- name 2 objects 2 2  Language- repeat 1 1  Language- follow 3 step command 3 3  Language- read & follow  direction 1 1  Write a sentence 1 1  Copy design 0 0  Total score 25 23     Attention span & concentration ability appears normal.  Speech is fluent,  without  dysarthria, dysphonia or aphasia.  Mood and affect are appropriate.  Cranial nerves: Pupils are equal but sluggish- reactive to light. Funduscopic exam deferred. Extraocular movements in vertical and horizontal planes intact and without nystagmus.  Visual fields by finger perimetry are intact. Hearing to finger rub impaired . Facial sensation intact to fine touch.Facial motor strength is symmetric and tongue and uvula move midline.  Shoulder shrug was symmetrical.   Motor exam: Normal tone, muscle bulk and symmetric strength in all extremities. He has weak grip strength but preserved pinch strength in both hands.  Sensory:  Fine touch, pinprick and vibration were decreased in both feet above ankle line.  Coordination: Rapid alternating movements in the fingers/hands was normal. Finger-to-nose maneuver normal without evidence of ataxia, dysmetria mild  tremor.  Gait and station: Patient walks without assistive device  Deep tendon reflexes: in the upper and lower extremities are symmetric and intact, except. Babinski maneuver response is downgoing.  The patient was advised of the nature of the diagnosed sleep disorder, the treatment options and risks for general a health and wellness arising from not treating the condition.  I spent more than 45 minutes of face to face time with the patient. Greater than 50% of time was spent in counseling and coordination of care.  We have discussed the diagnosis and differential and I answered the patient's questions.     Assessment:  After physical and neurologic examination, review of laboratory studies,  Personal review of imaging studies, reports of other /same  Imaging studies ,  Results of polysomnography/ neurophysiology testing and pre-existing records as far as provided in visit., my  assessment is   1) I would agree with Darrell Mcclain and Dr. Reynaldo Mcclain that Darrell Mcclain does have risk factors for obstructive sleep apnea, also it doesn't bother him to be possibly apneic. I do think there may be a marginal benefit on memory and overall attention if apnea could be treated if found.  2) Nocturia no longer present since catheterized.  3) memory loss, mood and logorrhea may indicate more fronto tempral or vascular dementia.   Plan:  Treatment plan and additional workup :  I will invite Darrell Mcclain for an attended sleep study, and I strongly suspect that this slightly pyknic and tachycardic gentleman will have hypoxemia and sleep apnea. A split study offers opportunity to treat the same night as a diagnosis is made.      Darrell Partridge Scout Gumbs Mcclain  06/05/2016  CC; Sarina Ill, Mcclain  CC: Darrell Mcclain, [address], [place] [address]

## 2016-06-05 NOTE — Patient Instructions (Signed)

## 2016-06-30 ENCOUNTER — Telehealth: Payer: Self-pay | Admitting: Cardiovascular Disease

## 2016-06-30 NOTE — Telephone Encounter (Signed)
Pt will need office visit. I spoke with pt and made appt for him to see Truitt Merle, NP on 07/02/16 at 1:30

## 2016-06-30 NOTE — Telephone Encounter (Signed)
Request for surgical clearance:  1. What type of surgery is being performed? TURP   2. When is this surgery scheduled? Not scheduled yet   3. Are there any medications that need to be held prior to surgery and how long?How long can patient hold his Xarelto?   4. Name of physician performing surgery? Dr Dell Ponto   5. What is your office phone and fax number? [phone] and fax is [phone]

## 2016-06-30 NOTE — Telephone Encounter (Signed)
Will ask triage to call Dr. Lyndal Rainbow office tomorrow and make them aware of appointment.

## 2016-07-01 ENCOUNTER — Encounter: Payer: Self-pay | Admitting: Nurse Practitioner

## 2016-07-01 NOTE — Telephone Encounter (Signed)
Left detailed message on Pam's voicemail (Dr. Junious Silk office) informing of pt's scheduled appt tomorrow.

## 2016-07-02 ENCOUNTER — Encounter: Payer: Self-pay | Admitting: Nurse Practitioner

## 2016-07-02 ENCOUNTER — Ambulatory Visit (INDEPENDENT_AMBULATORY_CARE_PROVIDER_SITE_OTHER): Payer: Medicare Other | Admitting: Nurse Practitioner

## 2016-07-02 VITALS — BP 110/56 | HR 68 | Ht 69.0 in | Wt 243.8 lb

## 2016-07-02 DIAGNOSIS — I5032 Chronic diastolic (congestive) heart failure: Secondary | ICD-10-CM | POA: Diagnosis not present

## 2016-07-02 DIAGNOSIS — I10 Essential (primary) hypertension: Secondary | ICD-10-CM | POA: Diagnosis not present

## 2016-07-02 DIAGNOSIS — Z01818 Encounter for other preprocedural examination: Secondary | ICD-10-CM | POA: Diagnosis not present

## 2016-07-02 DIAGNOSIS — I483 Typical atrial flutter: Secondary | ICD-10-CM | POA: Diagnosis not present

## 2016-07-02 NOTE — Progress Notes (Signed)
CARDIOLOGY OFFICE NOTE  Date:  07/02/2016    Darrell Mcclain Date of Birth: January 01, 1941 Medical Record #[id number]  PCP:  Geoffery Lyons, MD  Cardiologist:  [facility]    Chief Complaint  Patient presents with  . Pre-op Exam    Pre op exam - seen for Dr. Angelena Form    History of Present Illness: Darrell Mcclain is a 75 y.o. male who presents today for a pre op clearance visit. Seen for Dr. Angelena Form.   Last seen here back in January.   He has a history of DM, chronic kidney disease, HLD, HTN, OA, GERD, BPH and atrial fibrillation/flutter. He was seen as a new patient 09/22/13 for management of his newly diagnosed atrial flutter. He was seen by Dr. [name] on 09/14/13 and noted to be in atrial fibrillation. EKG in our office 09/22/13 with atrial flutter, rate 103 bpm. No awareness that his heart was irregular. He has been rated controlled on Cardizem and anti-coagulated with Xarelto. Echo on 10/06/13 with normal LV size and function, no significant valvular disease. He has been seen back several times with edema requiring increased doses of diuretics. Echo 08/23/15 with normal LV systolic function, grade 1 diastolic function. No valve disease. Has been referred to nephrology for CKD - he has refused to keep the appointment.   Comes in today. Here alone. Says "everything is fine except for his prostate". No chest pain. Not short of breath. No palpitations. No syncope. Grandson lives with him. He goes and does his own shopping. Has not been as active over the past month due to having a foley catheter in place. Planning to have "laser" on his prostate. Remains on Xarelto. No problems noted. Labs are checked by PCP.  He is anxious to proceed on.   Past Medical History:  Diagnosis Date  . Anemia   . Arthritis    right hand  . Atrial flutter ([place])   . Chronic kidney disease (CKD), stage III (moderate)   . Diabetes mellitus   . Enlarged prostate   . GERD (gastroesophageal reflux disease)     . Hyperlipidemia   . Hypertension   . Morbid obesity ([place])   . Vitamin B 12 deficiency     Past Surgical History:  Procedure Laterality Date  . OTHER SURGICAL HISTORY  1996   reconstruction left foot  . TONSILLECTOMY  1951  . WISDOM TOOTH EXTRACTION  1985     Medications: Current Outpatient Prescriptions  Medication Sig Dispense Refill  . acetaminophen (TYLENOL) 325 MG tablet Take 650 mg by mouth every 4 (four) hours as needed.    Marland Kitchen atorvastatin (LIPITOR) 20 MG tablet Take 1 tablet (20 mg total) by mouth daily. 30 tablet 3  . donepezil (ARICEPT) 10 MG tablet Take 1 tablet (10 mg total) by mouth at bedtime. 30 tablet 12  . dorzolamide (TRUSOPT) 2 % ophthalmic solution Place 1 drop into the left eye 2 (two) times daily.  3  . finasteride (PROSCAR) 5 MG tablet Take 5 mg by mouth daily.  3  . furosemide (LASIX) 40 MG tablet Take two (2) tablets (80 mg total) by mouth each morning and take one (1) tablet (40 mg total) by mouth each afternoon. 270 tablet 1  . insulin aspart (NOVOLOG) 100 UNIT/ML injection Inject 6 Units into the skin 3 (three) times daily before meals. 7am, 1230p, 530p    . insulin glargine (LANTUS) 100 UNIT/ML injection Inject 40 Units into the skin daily.     Marland Kitchen  ketorolac (ACULAR) 0.5 % ophthalmic solution Place 1 drop into the right eye daily. Reported on 11/21/2015  4  . lisinopril (PRINIVIL,ZESTRIL) 20 MG tablet Take 20 mg by mouth daily.    . metFORMIN (GLUCOPHAGE) 500 MG tablet Take 500 mg by mouth 2 (two) times daily with a meal.     . metoprolol succinate (TOPROL-XL) 50 MG 24 hr tablet Take 1 tablet (50 mg total) by mouth 2 (two) times daily. Take with or immediately following a meal. (Patient taking differently: Take 50 mg by mouth daily. Take with or immediately following a meal.) 60 tablet 11  . omeprazole (PRILOSEC) 20 MG capsule Take 20 mg by mouth daily.     . polyethylene glycol (MIRALAX / GLYCOLAX) packet Take 17 g by mouth daily.    . promethazine  (PHENERGAN) 12.5 MG tablet Take 12.5 mg by mouth every 4 (four) hours as needed for nausea or vomiting.    . Rivaroxaban (XARELTO) 20 MG TABS tablet Take 1 tablet (20 mg total) by mouth daily with supper. 30 tablet 6  . sulfamethoxazole-trimethoprim (BACTRIM DS) 800-160 MG per tablet Take 1 tablet by mouth daily. Reported on 11/21/2015    . tamsulosin (FLOMAX) 0.4 MG CAPS capsule Take 0.4 mg by mouth daily with breakfast.     . amLODipine (NORVASC) 5 MG tablet Take 5 mg by mouth daily.     No current facility-administered medications for this visit.     Allergies: No Known Allergies  Social History: The patient  reports that he quit smoking about 33 years ago. He has never used smokeless tobacco. He reports that he does not drink alcohol or use drugs.   Family History: The patient's family history is not on file.   Review of Systems: Please see the history of present illness.   Otherwise, the review of systems is positive for none.   All other systems are reviewed and negative.   Physical Exam: VS:  BP (!) 110/56   Pulse 68   Ht 5\' 9"  (1.753 m)   Wt 243 lb 12.8 oz (110.6 kg)   BMI 36.00 kg/m  .  BMI Body mass index is 36 kg/m.  Wt Readings from Last 3 Encounters:  07/02/16 243 lb 12.8 oz (110.6 kg)  06/05/16 240 lb (108.9 kg)  05/20/16 247 lb 3.2 oz (112.1 kg)    General: Pleasant. Obese male who is alert and in no acute distress.   HEENT: Normal.  Neck: Supple, no JVD, carotid bruits, or masses noted.  Cardiac: Irregular irregular rhythm. Rate is ok. No murmurs, rubs, or gallops. Trace edema.  Respiratory:  Lungs are clear to auscultation bilaterally with normal work of breathing.  GI: Soft and nontender.  MS: No deformity or atrophy. Gait and ROM intact.  Skin: Warm and dry. Color is normal.  Neuro:  Strength and sensation are intact and no gross focal deficits noted.  Psych: Alert, appropriate and with normal affect.   LABORATORY DATA:  EKG:  EKG is ordered today.  This demonstrates atrial flutter with variable block, inferior Q's. Unchanged.  Lab Results  Component Value Date   GLUCOSE 178 (H) 08/20/2015   NA 142 08/20/2015   K 4.6 08/20/2015   CL 102 08/20/2015   CREATININE 1.72 (H) 08/20/2015   BUN 33 (H) 08/20/2015   CO2 29 08/20/2015   TSH 1.070 11/21/2015    BNP (last 3 results) No results for input(s): BNP in the last 8760 hours.  ProBNP (last 3 results)  No results for input(s): PROBNP in the last 8760 hours.   Other Studies Reviewed Today:  Echo Study Conclusions from 08/2015  - Left ventricle: The cavity size was normal. Wall thickness was   increased in a pattern of mild LVH. Systolic function was normal.   The estimated ejection fraction was in the range of 55% to 60%.   Wall motion was normal; there were no regional wall motion   abnormalities. Doppler parameters are consistent with abnormal   left ventricular relaxation (grade 1 diastolic dysfunction).  Impressions:  - Normal LV systolic function; mild LVH; grade 1 diastolic   dysfunction.  Assessment/Plan: 1. Pre op clearance - needing TURP - not scheduled yet. No known CAD. Currently with no cardiac symptoms. Discussed with Dr. Marlou Porch. Ok to proceed on from a cardiac standpoint.    2. Chronic atrial flutter - managed with rate control and anticoagulation. CHADSVASC is 3 (age/HTN/CHF)  3. Chronic anticoagulation - no problems noted - discussed with Dr. Marlou Porch who favors holding for 3 days prior to his procedure given his CKD  4. Diastolic HF - seems fairly compensated at this time.   5. CKD - creatinine clearance from last creatinine in our system is 60 - ok to continue with current dose of Xarelto.   Current medicines are reviewed with the patient today.  The patient does not have concerns regarding medicines other than what has been noted above.  The following changes have been made:  See above.  Labs/ tests ordered today include:   No orders of the  defined types were placed in this encounter.    Disposition:   FU with Dr. Angelena Form has planned in January.   Patient is agreeable to this plan and will call if any problems develop in the interim.   Signed: Burtis Junes, RN, ANP-C 07/02/2016 2:09 PM  [facility] [address] [place], [place]  [address] Phone: [phone] Fax: [phone]

## 2016-07-02 NOTE — Patient Instructions (Addendum)
We will be checking the following labs today - NONE   Medication Instructions:    Continue with your current medicines.   Hold your Xarelto for 3 days prior to your surgery    Testing/Procedures To Be Arranged:  N/A  Follow-Up:   See Dr. Angelena Form in January    Other Special Instructions:   I will send Dr. Junious Silk a note about proceeding on with your procedure.     If you need a refill on your cardiac medications before your next appointment, please call your pharmacy.   Call the [place] office at [phone] if you have any questions, problems or concerns.

## 2016-07-04 ENCOUNTER — Other Ambulatory Visit: Payer: Self-pay | Admitting: Urology

## 2016-07-04 DIAGNOSIS — C61 Malignant neoplasm of prostate: Secondary | ICD-10-CM

## 2016-07-17 ENCOUNTER — Encounter (HOSPITAL_BASED_OUTPATIENT_CLINIC_OR_DEPARTMENT_OTHER): Payer: Self-pay | Admitting: *Deleted

## 2016-07-21 ENCOUNTER — Encounter (HOSPITAL_BASED_OUTPATIENT_CLINIC_OR_DEPARTMENT_OTHER): Payer: Self-pay | Admitting: *Deleted

## 2016-07-21 NOTE — Progress Notes (Addendum)
SPOKE W/ PT'S EX-WIFE, WHOM IS HIS CAREGIVER, [facility] GRAMMAR).   NPO AFTER MN.  ARRIVE AT 0730.  NEEDS ISTAT 8.  CURRENT EKG IN CHART AND EPIC.  WILL TAKE FLOMAX, PRILOSEC AND METOPROLOL AM DOS W/ SIPS OF WATER.  WILL DO FLEET ENEMA AM DOS.  WILL NEED [name] IN PRE-OP , PT HAS POOR MEMORY AND SHE DOES HIS MEDS.  ADDENDUM:  REVIEWED CHART W DR ROBERT FITZGERLD MDA,  [place].

## 2016-07-21 NOTE — H&P (Signed)
Office Visit Report     06/26/2016   --------------------------------------------------------------------------------   Darrell Mcclain  MRN: [id number]  PRIMARY CARE:  Richard A. [name], MD  DOB: 07-09-1941, 75 year old Male  REFERRING:  Georgette Dover, MD  SSN: -[id number] [id number]  PROVIDER:  Festus Aloe, M.D.    LOCATION:  [facility] [phone]   --------------------------------------------------------------------------------   CC: I have urinary retention.  HPI: Darrell Mcclain is a 75 year-old male established patient who is here for urinary retention.  His problem was diagnosed 05/16/2016. His current symptoms did not begin after he had a surgical procedure. His urinary retention is being treated with foley catheter. Patient denies suprapubic tube, intemittent catheterization, flomax, hytrin, cardura, uroxatrol, rapaflo, avodart, and proscar.   May 16, 2016 PVR 1250 ml. Foley placed. He was constipated. Now regular.   He returns to review urodynamics, another voiding trial and to consider cystoscopy. He failed void trial x 2. UDS revealed a 600 cc bladder capacity, slight decrease in sensation, obstructed flow pattern, quiet EMG, 360 mL postvoid.   He's ready to get rid of the catheter and do that "laser" if needed.     CC: I have symptoms of an enlarged prostate.  HPI: He first noticed the symptoms approximately 05/09/2014. His symptoms have gotten worse over the last year. He has been treated with Flomax and Proscar. The patient has never had a surgical procedure for bladder outlet obstruction to his prostate.   On tamsulosin. Prostate 108 g on U/S in 2015.   Started finasteride Sep 2017 during a urinary retention episode.     ALLERGIES: No Known Drug Allergies    MEDICATIONS: Proscar 5 mg tablet 1 tablet PO Daily  Atorvastatin Calcium 20 MG Oral Tablet Oral  DilTIAZem HCl ER 180 MG Oral Capsule Extended Release 24 Hour Oral  Eye Drops SOLN Ophthalmic   Humalog 1 PO Daily  Lantus 100 unit/ml vial 1 PO Daily  Lasix TABS Oral  Lisinopril-Hydrochlorothiazide 20-12.5 MG Oral Tablet Oral  MetFORMIN HCl - 1000 MG Oral Tablet Oral  Omeprazole 20 MG Oral Capsule Delayed Release Oral  Tamsulosin HCl - 0.4 MG Oral Capsule Oral  Vitamin D  Xarelto 20 MG Oral Tablet Oral     GU PSH: Complex cystometrogram, w/ void pressure and urethral pressure profile studies, any technique - 06/18/2016 Complex Uroflow - 06/18/2016 Emg surf Electrd - 06/18/2016 Inject For cystogram - 06/18/2016 Intrabd voidng Press - 06/18/2016      PSH Notes: Cath Laser Angioplasty,  Foot Surgery   NON-GU PSH: Cataract Surgery Remove Tonsils - 2015    GU PMH: Urinary Retention - 06/10/2016, - 05/26/2016 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/2016, BPH (benign prostatic hypertrophy) with urinary obstruction, - 01/29/2015 Elevated PSA - 05/26/2016, PSA elevation, - 07/10/2014 Incomplete bladder emptying - 05/16/2016 Dysuria (Worsening), Uribel 118 mg 1 po BID Discuss behavioral/fluid modifications - 05/13/2016 ED, arterial insufficiency, Erectile dysfunction due to arterial insufficiency - 01/29/2015 Urinary Urgency, Urinary urgency - 01/29/2015    NON-GU PMH: Encounter for general adult medical examination without abnormal findings, Encounter for preventive health examination - 01/29/2015 Personal history of other diseases of the circulatory system, History of atrial fibrillation - 2015, History of hypertension, - 2015 Personal history of other diseases of the digestive system, History of esophageal reflux - 2015 Personal history of other diseases of the musculoskeletal system and connective tissue, History of arthritis - 2015 Personal history of other endocrine, nutritional and metabolic disease, History of hypercholesterolemia - 2015,  History of diabetes mellitus, - 12/08/13    FAMILY HISTORY: Death - Mother, Father No Significant Family History - Runs In Family   SOCIAL HISTORY: Marital Status:  Single Current Smoking Status: Patient does not smoke anymore.  Does not drink anymore.  Drinks 4+ caffeinated drinks per day. Patient's occupation is/was Retired.    REVIEW OF SYSTEMS:    GU Review Male:   Patient denies frequent urination, hard to postpone urination, burning/ pain with urination, get up at night to urinate, leakage of urine, stream starts and stops, trouble starting your stream, have to strain to urinate , erection problems, and penile pain.  Gastrointestinal (Upper):   Patient denies nausea, vomiting, and indigestion/ heartburn.  Gastrointestinal (Lower):   Patient reports constipation. Patient denies diarrhea.  Constitutional:   Patient denies fever, night sweats, weight loss, and fatigue.  Skin:   Patient denies skin rash/ lesion and itching.  Eyes:   Patient denies blurred vision and double vision.  Ears/ Nose/ Throat:   Patient denies sore throat and sinus problems.  Hematologic/Lymphatic:   Patient denies swollen glands and easy bruising.  Cardiovascular:   Patient reports leg swelling. Patient denies chest pains.  Respiratory:   Patient denies cough and shortness of breath.  Endocrine:   Patient denies excessive thirst.  Musculoskeletal:   Patient denies back pain and joint pain.  Neurological:   Patient denies headaches and dizziness.  Psychologic:   Patient denies depression and anxiety.   VITAL SIGNS:      06/26/2016 02:51 PM  BP 116/80 mmHg  Heart Rate 99 /min  Temperature 97.6 F / 36 C   MULTI-SYSTEM PHYSICAL EXAMINATION:    Constitutional: Well-nourished. No physical deformities. Normally developed. Good grooming.  Neck: Neck symmetrical, not swollen. Normal tracheal position.  Respiratory: No labored breathing, no use of accessory muscles.   Cardiovascular: Normal temperature, normal extremity pulses, no swelling, no varicosities.  Skin: No paleness, no jaundice, no cyanosis. No lesion, no ulcer, no rash.  Neurologic / Psychiatric: Oriented to time,  oriented to place, oriented to person. No depression, no anxiety, no agitation.     PAST DATA REVIEWED:  Source Of History:  Patient  X-Ray Review: Voiding Cystourethrogram: Reviewed Films.     05/13/16 01/22/15 06/23/14 02/21/14 12/15/13 10/05/13  PSA  Total PSA 10.60  3.70  3.36  6.15  5.09  5.62   Free PSA 6.03    2.36     % Free PSA 57    38       PROCEDURES:         Flexible Cystoscopy - 52000  Risks, benefits, and some of the potential complications of the procedure were discussed with the patient. All questions were answered. Informed consent was obtained. Antibiotic prophylaxis was given -- Cipro. Sterile technique and intraurethral analgesia were used.  Meatus:  Normal size. Normal location. Normal condition.  Urethra:  No strictures.  External Sphincter:  Normal.  Verumontanum:  Normal.  Prostate:  Moderate hyperplasia - trilobar. Non-obstructing.  Bladder Neck:  Non-obstructing.  Ureteral Orifices:  Normal location. Normal size. Normal shape. Effluxed clear urine.  Bladder:  No trabeculation. No tumors. Normal mucosa. No stones.      The lower urinary tract was carefully examined. The procedure was well-tolerated and without complications. Antibiotic instructions were given. Instructions were given to call the office immediately for bloody urine, difficulty urinating, painful urination, fever, chills, nausea, vomiting or other illness. The patient stated that he understood these instructions and would  comply with them.   ASSESSMENT:      ICD-10 Details  1 GU:   Urinary Retention - R33.8   2   BPH w/LUTS - N40.1    PLAN:            Medications New Meds: Cipro 500 mg tablet 1 tablet PO BID start THREE days before prostate procedure  #10  0 Refill(s)            Schedule Return Visit: Next Available Appointment - Office Visit, Schedule Surgery          Document Letter(s):  Created for Patient: Clinical Summary         Notes:   BPH - on combination.    Retention - discussed voiding trial. Patient does not want voiding trial today. He would like to proceed with intervention. I discussed with him the nature risks benefits and alternatives to laser photo vaporization of the prostate. I believe he'll do well. He's had an obstructing median lobe. We discussed flow symptoms and retention typically resolve but there are instances when continued Foley catheter is needed. I also discussed risk of bleeding infection stricture ED and incontinence among others. He elects to proceed.   cc: Dr. [name]     * Signed by Festus Aloe, M.D. on 06/26/16 at 5:33 PM (EDT)*     The information contained in this medical record document is considered private and confidential patient information. This information can only be used for the medical diagnosis and/or medical services that are being provided by the patient's selected caregivers. This information can only be distributed outside of the patient's care if the patient agrees and signs waivers of authorization for this information to be sent to an outside source or route.  Add: I discussed with patient and his wife the PSA elevation and the nature r/b of prostate bx and they wanted to do that as well.

## 2016-07-22 ENCOUNTER — Emergency Department (HOSPITAL_COMMUNITY)
Admission: EM | Admit: 2016-07-22 | Discharge: 2016-07-23 | Disposition: A | Discharge status: Home / Self Care | Payer: Medicare Other | Source: Home / Self Care | Attending: Physician Assistant | Admitting: Physician Assistant

## 2016-07-22 ENCOUNTER — Encounter (HOSPITAL_COMMUNITY): Payer: Self-pay | Admitting: Emergency Medicine

## 2016-07-22 ENCOUNTER — Ambulatory Visit (HOSPITAL_BASED_OUTPATIENT_CLINIC_OR_DEPARTMENT_OTHER)
Admission: RE | Admit: 2016-07-22 | Discharge: 2016-07-22 | Disposition: A | Discharge status: Home / Self Care | Payer: Medicare Other | Source: Ambulatory Visit | Attending: Urology | Admitting: Urology

## 2016-07-22 ENCOUNTER — Ambulatory Visit (HOSPITAL_BASED_OUTPATIENT_CLINIC_OR_DEPARTMENT_OTHER): Payer: Medicare Other | Admitting: Anesthesiology

## 2016-07-22 ENCOUNTER — Encounter (HOSPITAL_BASED_OUTPATIENT_CLINIC_OR_DEPARTMENT_OTHER)
Admission: RE | Disposition: A | Discharge status: Home / Self Care | Payer: Self-pay | Source: Ambulatory Visit | Attending: Urology

## 2016-07-22 ENCOUNTER — Ambulatory Visit (HOSPITAL_COMMUNITY)
Admission: RE | Admit: 2016-07-22 | Discharge: 2016-07-22 | Disposition: A | Discharge status: Home / Self Care | Payer: Medicare Other | Source: Ambulatory Visit | Attending: Urology | Admitting: Urology

## 2016-07-22 DIAGNOSIS — Z87891 Personal history of nicotine dependence: Secondary | ICD-10-CM | POA: Insufficient documentation

## 2016-07-22 DIAGNOSIS — Y738 Miscellaneous gastroenterology and urology devices associated with adverse incidents, not elsewhere classified: Secondary | ICD-10-CM

## 2016-07-22 DIAGNOSIS — R972 Elevated prostate specific antigen [PSA]: Secondary | ICD-10-CM | POA: Insufficient documentation

## 2016-07-22 DIAGNOSIS — E1122 Type 2 diabetes mellitus with diabetic chronic kidney disease: Secondary | ICD-10-CM | POA: Insufficient documentation

## 2016-07-22 DIAGNOSIS — Z794 Long term (current) use of insulin: Secondary | ICD-10-CM

## 2016-07-22 DIAGNOSIS — Z9849 Cataract extraction status, unspecified eye: Secondary | ICD-10-CM | POA: Insufficient documentation

## 2016-07-22 DIAGNOSIS — T83098A Other mechanical complication of other indwelling urethral catheter, initial encounter: Secondary | ICD-10-CM

## 2016-07-22 DIAGNOSIS — I10 Essential (primary) hypertension: Secondary | ICD-10-CM | POA: Diagnosis not present

## 2016-07-22 DIAGNOSIS — N183 Chronic kidney disease, stage 3 (moderate): Secondary | ICD-10-CM | POA: Insufficient documentation

## 2016-07-22 DIAGNOSIS — Z7901 Long term (current) use of anticoagulants: Secondary | ICD-10-CM

## 2016-07-22 DIAGNOSIS — I4892 Unspecified atrial flutter: Secondary | ICD-10-CM

## 2016-07-22 DIAGNOSIS — K219 Gastro-esophageal reflux disease without esophagitis: Secondary | ICD-10-CM | POA: Insufficient documentation

## 2016-07-22 DIAGNOSIS — E7800 Pure hypercholesterolemia, unspecified: Secondary | ICD-10-CM | POA: Diagnosis not present

## 2016-07-22 DIAGNOSIS — I5032 Chronic diastolic (congestive) heart failure: Secondary | ICD-10-CM

## 2016-07-22 DIAGNOSIS — N401 Enlarged prostate with lower urinary tract symptoms: Secondary | ICD-10-CM | POA: Diagnosis not present

## 2016-07-22 DIAGNOSIS — R338 Other retention of urine: Secondary | ICD-10-CM | POA: Diagnosis not present

## 2016-07-22 DIAGNOSIS — T839XXA Unspecified complication of genitourinary prosthetic device, implant and graft, initial encounter: Secondary | ICD-10-CM

## 2016-07-22 DIAGNOSIS — Z7984 Long term (current) use of oral hypoglycemic drugs: Secondary | ICD-10-CM

## 2016-07-22 DIAGNOSIS — Z8673 Personal history of transient ischemic attack (TIA), and cerebral infarction without residual deficits: Secondary | ICD-10-CM | POA: Insufficient documentation

## 2016-07-22 DIAGNOSIS — M1990 Unspecified osteoarthritis, unspecified site: Secondary | ICD-10-CM | POA: Diagnosis not present

## 2016-07-22 DIAGNOSIS — I4891 Unspecified atrial fibrillation: Secondary | ICD-10-CM | POA: Diagnosis not present

## 2016-07-22 DIAGNOSIS — E119 Type 2 diabetes mellitus without complications: Secondary | ICD-10-CM | POA: Insufficient documentation

## 2016-07-22 DIAGNOSIS — Z79899 Other long term (current) drug therapy: Secondary | ICD-10-CM | POA: Diagnosis not present

## 2016-07-22 DIAGNOSIS — I130 Hypertensive heart and chronic kidney disease with heart failure and stage 1 through stage 4 chronic kidney disease, or unspecified chronic kidney disease: Secondary | ICD-10-CM

## 2016-07-22 DIAGNOSIS — C61 Malignant neoplasm of prostate: Secondary | ICD-10-CM

## 2016-07-22 HISTORY — DX: Chronic diastolic (congestive) heart failure: I50.32

## 2016-07-22 HISTORY — DX: Personal history of transient ischemic attack (TIA), and cerebral infarction without residual deficits: Z86.73

## 2016-07-22 HISTORY — DX: Presence of urogenital implants: Z96.0

## 2016-07-22 HISTORY — DX: Cerebrovascular disease, unspecified: I67.9

## 2016-07-22 HISTORY — DX: Long term (current) use of anticoagulants: Z79.01

## 2016-07-22 HISTORY — DX: Other amnesia: R41.3

## 2016-07-22 HISTORY — DX: Localized edema: R60.0

## 2016-07-22 HISTORY — DX: Unspecified hearing loss, unspecified ear: H91.90

## 2016-07-22 HISTORY — DX: Bifascicular block: I45.2

## 2016-07-22 HISTORY — DX: Unspecified atrial flutter: I48.92

## 2016-07-22 HISTORY — DX: Other constipation: K59.09

## 2016-07-22 HISTORY — DX: Benign prostatic hyperplasia without lower urinary tract symptoms: N40.0

## 2016-07-22 HISTORY — DX: Retention of urine, unspecified: R33.9

## 2016-07-22 HISTORY — DX: Personal history of colonic polyps: Z86.010

## 2016-07-22 HISTORY — DX: Other specified personal risk factors, not elsewhere classified: Z91.89

## 2016-07-22 HISTORY — PX: PROSTATE BIOPSY: SHX241

## 2016-07-22 HISTORY — DX: Presence of other specified devices: Z97.8

## 2016-07-22 HISTORY — DX: Other cerebrovascular disease: I67.89

## 2016-07-22 HISTORY — DX: Unspecified glaucoma: H40.9

## 2016-07-22 HISTORY — DX: Personal history of adenomatous and serrated colon polyps: Z86.0101

## 2016-07-22 HISTORY — DX: Iron deficiency anemia, unspecified: D50.9

## 2016-07-22 HISTORY — DX: Type 2 diabetes mellitus without complications: E11.9

## 2016-07-22 HISTORY — DX: Rosacea, unspecified: L71.9

## 2016-07-22 HISTORY — DX: Dyspnea, unspecified: R06.00

## 2016-07-22 HISTORY — DX: Other forms of dyspnea: R06.09

## 2016-07-22 LAB — POCT I-STAT, CHEM 8
BUN: 30 mg/dL — ABNORMAL HIGH (ref 6–20)
CHLORIDE: 101 mmol/L (ref 101–111)
Calcium, Ion: 1.16 mmol/L (ref 1.15–1.40)
Creatinine, Ser: 1.8 mg/dL — ABNORMAL HIGH (ref 0.61–1.24)
Glucose, Bld: 76 mg/dL (ref 65–99)
HEMATOCRIT: 36 % — AB (ref 39.0–52.0)
HEMOGLOBIN: 12.2 g/dL — AB (ref 13.0–17.0)
POTASSIUM: 3.6 mmol/L (ref 3.5–5.1)
SODIUM: 142 mmol/L (ref 135–145)
TCO2: 27 mmol/L (ref 0–100)

## 2016-07-22 LAB — GLUCOSE, CAPILLARY: Glucose-Capillary: 75 mg/dL (ref 65–99)

## 2016-07-22 LAB — CBG MONITORING, ED: Glucose-Capillary: 176 mg/dL — ABNORMAL HIGH (ref 65–99)

## 2016-07-22 SURGERY — THULIUM LASER TURP (TRANSURETHRAL RESECTION OF PROSTATE)
Anesthesia: General | Site: Prostate

## 2016-07-22 MED ORDER — CEFAZOLIN SODIUM-DEXTROSE 2-4 GM/100ML-% IV SOLN
INTRAVENOUS | Status: AC
  Filled 2016-07-22: qty 100

## 2016-07-22 MED ORDER — PROPOFOL 10 MG/ML IV BOLUS
INTRAVENOUS | Status: AC
  Filled 2016-07-22: qty 20

## 2016-07-22 MED ORDER — SODIUM CHLORIDE 0.9 % IV SOLN
INTRAVENOUS | Status: DC
  Administered 2016-07-22: 08:00:00 via INTRAVENOUS
  Filled 2016-07-22: qty 1000

## 2016-07-22 MED ORDER — LIDOCAINE HCL 2 % EX GEL
CUTANEOUS | Status: DC | PRN
  Administered 2016-07-22: 1 via URETHRAL

## 2016-07-22 MED ORDER — RIVAROXABAN 20 MG PO TABS: 20.0000 mg | ORAL_TABLET | Freq: Every day | ORAL | 6 refills | Status: AC

## 2016-07-22 MED ORDER — ONDANSETRON HCL 4 MG/2ML IJ SOLN
INTRAMUSCULAR | Status: AC
  Filled 2016-07-22: qty 2

## 2016-07-22 MED ORDER — HYDROMORPHONE HCL 1 MG/ML IJ SOLN
0.2500 mg | INTRAMUSCULAR | Status: DC | PRN
  Filled 2016-07-22: qty 0.5

## 2016-07-22 MED ORDER — PROPOFOL 10 MG/ML IV BOLUS
INTRAVENOUS | Status: DC | PRN
  Administered 2016-07-22: 170 mg via INTRAVENOUS

## 2016-07-22 MED ORDER — LIDOCAINE 2% (20 MG/ML) 5 ML SYRINGE
INTRAMUSCULAR | Status: DC | PRN
  Administered 2016-07-22: 100 mg via INTRAVENOUS

## 2016-07-22 MED ORDER — BELLADONNA ALKALOIDS-OPIUM 16.2-60 MG RE SUPP
RECTAL | Status: AC
  Filled 2016-07-22: qty 1

## 2016-07-22 MED ORDER — SODIUM CHLORIDE 0.9 % IR SOLN
Status: DC | PRN
  Administered 2016-07-22: 21000 mL

## 2016-07-22 MED ORDER — PHENYLEPHRINE 40 MCG/ML (10ML) SYRINGE FOR IV PUSH (FOR BLOOD PRESSURE SUPPORT)
PREFILLED_SYRINGE | INTRAVENOUS | Status: DC | PRN
  Administered 2016-07-22 (×3): 80 ug via INTRAVENOUS

## 2016-07-22 MED ORDER — FENTANYL CITRATE (PF) 100 MCG/2ML IJ SOLN
INTRAMUSCULAR | Status: DC | PRN
  Administered 2016-07-22: 50 ug via INTRAVENOUS

## 2016-07-22 MED ORDER — CEFAZOLIN SODIUM-DEXTROSE 2-4 GM/100ML-% IV SOLN
2.0000 g | INTRAVENOUS | Status: AC
  Administered 2016-07-22: 2 g via INTRAVENOUS
  Filled 2016-07-22: qty 100

## 2016-07-22 MED ORDER — BELLADONNA ALKALOIDS-OPIUM 16.2-60 MG RE SUPP
RECTAL | Status: DC | PRN
  Administered 2016-07-22: 1 via RECTAL

## 2016-07-22 MED ORDER — LIDOCAINE 2% (20 MG/ML) 5 ML SYRINGE
INTRAMUSCULAR | Status: AC
  Filled 2016-07-22: qty 5

## 2016-07-22 MED ORDER — CEPHALEXIN 500 MG PO CAPS: 500.0000 mg | ORAL_CAPSULE | Freq: Two times a day (BID) | ORAL | 0 refills | Status: DC

## 2016-07-22 MED ORDER — PROMETHAZINE HCL 25 MG/ML IJ SOLN
6.2500 mg | INTRAMUSCULAR | Status: DC | PRN
  Filled 2016-07-22: qty 1

## 2016-07-22 MED ORDER — CEFAZOLIN IN D5W 1 GM/50ML IV SOLN
1.0000 g | INTRAVENOUS | Status: DC
  Filled 2016-07-22: qty 50

## 2016-07-22 MED ORDER — FENTANYL CITRATE (PF) 100 MCG/2ML IJ SOLN
INTRAMUSCULAR | Status: AC
  Filled 2016-07-22: qty 2

## 2016-07-22 MED ORDER — PHENYLEPHRINE HCL 10 MG/ML IJ SOLN
INTRAVENOUS | Status: DC | PRN
  Administered 2016-07-22: 100 ug/min via INTRAVENOUS

## 2016-07-22 SURGICAL SUPPLY — 43 items
BAG DRAIN URO-CYSTO SKYTR STRL (DRAIN) ×2 IMPLANT
BAG URINE DRAINAGE (UROLOGICAL SUPPLIES) ×2 IMPLANT
CATH COUDE FOLEY 2W 5CC 18FR (CATHETERS) IMPLANT
CATH COUDE FOLEY 2W 5CC 20FR (CATHETERS) ×2 IMPLANT
CATH FOLEY 2WAY SLVR  5CC 18FR (CATHETERS)
CATH FOLEY 2WAY SLVR 5CC 18FR (CATHETERS) IMPLANT
CATH FOLEY 3WAY 30CC 22F (CATHETERS) IMPLANT
CLOTH BEACON ORANGE TIMEOUT ST (SAFETY) ×2 IMPLANT
DRSG TELFA 3X8 NADH (GAUZE/BANDAGES/DRESSINGS) IMPLANT
ELECT BIVAP BIPO 22/24 DONUT (ELECTROSURGICAL)
ELECT LOOP MED HF 24F 12D (CUTTING LOOP) IMPLANT
ELECTRD BIVAP BIPO 22/24 DONUT (ELECTROSURGICAL) IMPLANT
GLOVE BIO SURGEON STRL SZ 6.5 (GLOVE) ×2 IMPLANT
GLOVE BIO SURGEON STRL SZ7.5 (GLOVE) ×2 IMPLANT
GLOVE BIOGEL PI IND STRL 6.5 (GLOVE) ×1 IMPLANT
GLOVE BIOGEL PI IND STRL 7.5 (GLOVE) ×2 IMPLANT
GLOVE BIOGEL PI INDICATOR 6.5 (GLOVE) ×1
GLOVE BIOGEL PI INDICATOR 7.5 (GLOVE) ×2
GLOVE SURG SS PI 8.0 STRL IVOR (GLOVE) IMPLANT
GOWN STRL REUS W/ TWL XL LVL3 (GOWN DISPOSABLE) IMPLANT
GOWN STRL REUS W/TWL LRG LVL3 (GOWN DISPOSABLE) ×6 IMPLANT
GOWN STRL REUS W/TWL XL LVL3 (GOWN DISPOSABLE)
HOLDER FOLEY CATH W/STRAP (MISCELLANEOUS) ×2 IMPLANT
INST BIOPSY MAXCORE 18GX25 (NEEDLE) ×2 IMPLANT
INSTR BIOPSY MAXCORE 18GX20 (NEEDLE) IMPLANT
IV NS IRRIG 3000ML ARTHROMATIC (IV SOLUTION) ×14 IMPLANT
IV SET EXTENSION GRAVITY 40 LF (IV SETS) IMPLANT
KIT ROOM TURNOVER WOR (KITS) ×2 IMPLANT
LASER REVOLIX PROCEDURE (MISCELLANEOUS) ×2 IMPLANT
LOOP CUT BIPOLAR 24F LRG (ELECTROSURGICAL) IMPLANT
MANIFOLD NEPTUNE II (INSTRUMENTS) ×2 IMPLANT
MED FIBERS INC ×2 IMPLANT
NDL SAFETY ECLIPSE 18X1.5 (NEEDLE) IMPLANT
NEEDLE HYPO 18GX1.5 SHARP (NEEDLE)
NEEDLE SPNL 22GX7 QUINCKE BK (NEEDLE) IMPLANT
PACK CYSTO (CUSTOM PROCEDURE TRAY) ×2 IMPLANT
SURGILUBE 2OZ TUBE FLIPTOP (MISCELLANEOUS) IMPLANT
SYR 30ML LL (SYRINGE) IMPLANT
SYR CONTROL 10ML LL (SYRINGE) IMPLANT
SYRINGE IRR TOOMEY STRL 70CC (SYRINGE) IMPLANT
TOWEL OR 17X24 6PK STRL BLUE (TOWEL DISPOSABLE) IMPLANT
TUBE CONNECTING 12X1/4 (SUCTIONS) ×2 IMPLANT
UNDERPAD 30X30 INCONTINENT (UNDERPADS AND DIAPERS) ×2 IMPLANT

## 2016-07-22 NOTE — ED Notes (Signed)
Attempted to bladder scan patient and NT assisted with 0 volume. Informed Tyler, PA about this situation, he reports he will come to bedside.

## 2016-07-22 NOTE — Transfer of Care (Signed)
Immediate Anesthesia Transfer of Care Note  Patient: Darrell Mcclain  Procedure(s) Performed: Procedure(s): THULIUM LASER TURP (TRANSURETHRAL RESECTION OF PROSTATE) (N/A) BIOPSY TRANSRECTAL ULTRASONIC PROSTATE (TUBP) (N/A)  Patient Location: PACU  Anesthesia Type:General  Level of Consciousness: sedated and responds to stimulation  Airway & Oxygen Therapy: Patient Spontanous Breathing and Patient connected to nasal cannula oxygen  Post-op Assessment: Report given to RN  Post vital signs: Reviewed and stable  Last Vitals: 124/60, 69, 18, 97%, 98.5 Vitals:   07/22/16 0734  BP: 126/63  Pulse: 79  Resp: 18  Temp: 37.5 C    Last Pain:  Vitals:   07/22/16 0734  TempSrc: Oral      Patients Stated Pain Goal: 5 (58/83/25 4982)  Complications: No apparent anesthesia complications

## 2016-07-22 NOTE — Anesthesia Preprocedure Evaluation (Signed)
Anesthesia Evaluation  Patient identified by MRN, date of birth, ID band Patient awake    Reviewed: Allergy & Precautions, NPO status , Patient's Chart, lab work & pertinent test results  History of Anesthesia Complications Negative for: history of anesthetic complications  Airway Mallampati: II  TM Distance: >3 FB Neck ROM: Full    Dental no notable dental hx. (+) Dental Advisory Given   Pulmonary former smoker,    Pulmonary exam normal        Cardiovascular hypertension, Normal cardiovascular exam+ dysrhythmias Atrial Fibrillation   Impressions:  - Normal LV systolic function; mild LVH; grade 1 diastolic   dysfunction.    Neuro/Psych CVA negative psych ROS   GI/Hepatic Neg liver ROS, GERD  Medicated,  Endo/Other  diabetes, Insulin Dependent, Oral Hypoglycemic Agents  Renal/GU      Musculoskeletal  (+) Arthritis ,   Abdominal   Peds  Hematology   Anesthesia Other Findings   Reproductive/Obstetrics                             Anesthesia Physical Anesthesia Plan  ASA: III  Anesthesia Plan: General   Post-op Pain Management:    Induction: Intravenous  Airway Management Planned: LMA  Additional Equipment:   Intra-op Plan:   Post-operative Plan: Extubation in OR  Informed Consent: I have reviewed the patients History and Physical, chart, labs and discussed the procedure including the risks, benefits and alternatives for the proposed anesthesia with the patient or authorized representative who has indicated his/her understanding and acceptance.   Dental advisory given  Plan Discussed with: CRNA and Anesthesiologist  Anesthesia Plan Comments:         Anesthesia Quick Evaluation

## 2016-07-22 NOTE — ED Triage Notes (Signed)
Pt had laser prostate surgery with biopsy today and went home around 1630  Pt states he is leaking around the catheter and it is not draining  Pt states he has pain when he feels like he needs to urinate but the pain is not constant   Pt states he took lasix 40 mg around 7pm and has been drinking fluids

## 2016-07-22 NOTE — Anesthesia Postprocedure Evaluation (Signed)
Anesthesia Post Note  Patient: Camron Monday  Procedure(s) Performed: Procedure(s) (LRB): THULIUM LASER TURP (TRANSURETHRAL RESECTION OF PROSTATE) (N/A) BIOPSY TRANSRECTAL ULTRASONIC PROSTATE (TUBP) (N/A)  Patient location during evaluation: PACU Anesthesia Type: General Level of consciousness: sedated Pain management: pain level controlled Vital Signs Assessment: post-procedure vital signs reviewed and stable Respiratory status: spontaneous breathing and respiratory function stable Cardiovascular status: stable Anesthetic complications: no    Last Vitals:  Vitals:   07/22/16 1200 07/22/16 1215  BP: (!) 103/51 (!) 107/52  Pulse: 69 68  Resp: 18 14  Temp:      Last Pain:  Vitals:   07/22/16 0734  TempSrc: Oral                 Ryiah Bellissimo DANIEL

## 2016-07-22 NOTE — Interval H&P Note (Signed)
History and Physical Interval Note:  07/22/2016 8:56 AM  Darrell Mcclain  has presented today for surgery, with the diagnosis of BPH AND URINARY RETENSION  The various methods of treatment have been discussed with the patient and family. After consideration of risks, benefits and other options for treatment, the patient has consented to  Procedure(s): THULIUM LASER TURP (TRANSURETHRAL RESECTION OF PROSTATE) (N/A) BIOPSY TRANSRECTAL ULTRASONIC PROSTATE (TUBP) (N/A) as a surgical intervention .  The patient's history has been reviewed, patient examined, no change in status, stable for surgery.  I have reviewed the patient's chart and labs. He had one episode of diarrhea after he started Cipro three days ago and then it stopped. He continued Cipro. He's been well. Urine clear.  No fever. Questions were answered to the patient's satisfaction.     Darrell Mcclain

## 2016-07-22 NOTE — Anesthesia Procedure Notes (Signed)
Procedure Name: LMA Insertion Date/Time: 07/22/2016 9:24 AM Performed by: Bethena Roys T Pre-anesthesia Checklist: Patient identified, Emergency Drugs available, Suction available and Patient being monitored Patient Re-evaluated:Patient Re-evaluated prior to inductionOxygen Delivery Method: Circle system utilized Preoxygenation: Pre-oxygenation with 100% oxygen Intubation Type: IV induction Ventilation: Mask ventilation without difficulty LMA: LMA inserted LMA Size: 5.0 Number of attempts: 1 Airway Equipment and Method: Bite block Placement Confirmation: positive ETCO2 Tube secured with: Tape Dental Injury: Teeth and Oropharynx as per pre-operative assessment

## 2016-07-22 NOTE — ED Notes (Signed)
Dorothea Ogle, [place] came to bedside to attempt bladder scan but 0 volume with his scans.

## 2016-07-22 NOTE — Procedures (Deleted)
L ax LN biopsy 18 g core times 5 No comp/EBL

## 2016-07-22 NOTE — Op Note (Signed)
Preoperative diagnosis: BPH, urinary retention, elevated PSA Postoperative diagnosis: Same  Procedure: Thulium laser vaporization of the prostate, Transrectal ultrasound of the prostate, Transrectal ultrasound-guided prostate biopsy  Surgeon: Junious Silk  Anesthesia: Gen.  Indication for procedure: 75 year old with BPH and urinary retention and failed voiding trials despite maximal medical therapy. He elected to proceed with surgical therapy. While under anesthesia and antibiotics elected to proceed with prostate biopsy.  Findings: On cystoscopy the urethra was unremarkable, the prostatic urethra was elongated with trilobar hypertrophy and an elevated bladder neck. The bladder was moderately trabeculated with some erythema posteriorly from the Foley. There were no papillary tumors in the bladder nor any stones or foreign bodies. Ureteral orifices were in their normal orthotopic position with clear efflux.  Transrectal ultrasound of the prostate: This revealed a normal homogenous prostate without obvious calcification or hypoechoic areas. The capsule and seminal vesicles were normal. The prostate measured 5.87 by 5.85 x 4.29 for a measurement of 78.5 mL.  A standard 12 core biopsy was taken. Base mid apex, lateral to medial, right and left.  On final exam a B&O suppository was placed and the prostate was palpably normal without hard area or nodule. The gloves finger was dry without bleeding.  Description of procedure: After consent was obtained patient brought to the operating room. After adequate anesthesia he was placed in lithotomy position and prepped and draped in the usual sterile fashion. A timeout was performed to confirm the patient and procedure. The cystoscope with the laser continuous-flow sheath was passed per urethra and the bladder inspected. I marked the ureteral orifices with a coagulation dot. I then made incisions in line with ureteral orifices at about 5:00 and 7:00 down to the  bladder neck and brought these incisions down to the veru and laterally and the typical hockey stick incision on the apex. I then vaporized the median lobe. I then went from anterior to posterior bladder neck down to the veru and vaporize the lateral lobes on the right and the left. The bladder was drained and some residual lateral lobe tissue was vaporized bilaterally. The inferior, bladder neck and ureteral orifices were periodically check throughout the case and vaporization commenced in between these landmarks on the BPH tissue only. This created an excellent channel. At low-pressure hemostasis was excellent. A placed a 22 [place] coud catheter but 17 mL in the balloon. This was seated at the bladder neck. Irrigation was clear.  I then inserted the ultrasound probe per rectum and sagittal and transverse images were obtained. A 12 core prostate needle biopsy was then obtained. The probe was withdrawn.  The patient was cleaned up. A digital rectal exam was performed I placed a B&O suppository. The gloves finger was without blood. The catheter was irrigated again after a glove change and irrigation remained clear.  He was then awakened and taken to the recovery room in stable condition.  Complications: None  Blood loss: 15 mL  Specimens: 12 core prostate biopsy to pathology-Base mid apex, lateral to medial, right and left.  Drains: 72 French coud catheter  Disposition: Patient stable to PACU

## 2016-07-22 NOTE — Discharge Instructions (Signed)
Foley Catheter Care, Adult  A Foley catheter is a soft, flexible tube that is placed into the bladder to drain urine. A Foley catheter may be inserted if:  · You leak urine or are not able to control when you urinate (urinary incontinence).  · You are not able to urinate when you need to (urinary retention).  · You had prostate surgery or surgery on the genitals.  · You have certain medical conditions, such as multiple sclerosis, dementia, or a spinal cord injury.  If you are going home with a Foley catheter in place, follow the instructions below.  TAKING CARE OF THE CATHETER  1. Wash your hands with soap and water.  2. Using mild soap and warm water on a clean washcloth:  ¨ Clean the area on your body closest to the catheter insertion site using a circular motion, moving away from the catheter. Never wipe toward the catheter because this could sweep bacteria up into the urethra and cause infection.  ¨ Remove all traces of soap. Pat the area dry with a clean towel. For males, reposition the foreskin.  3. Attach the catheter to your leg so there is no tension on the catheter. Use adhesive tape or a leg strap. If you are using adhesive tape, remove any sticky residue left behind by the previous tape you used.  4. Keep the drainage bag below the level of the bladder, but keep it off the floor.  5. Check throughout the day to be sure the catheter is working and urine is draining freely. Make sure the tubing does not become kinked.  6. Do not pull on the catheter or try to remove it. Pulling could damage internal tissues.  TAKING CARE OF THE DRAINAGE BAGS  You will be given two drainage bags to take home. One is a large overnight drainage bag, and the other is a smaller leg bag that fits underneath clothing. You may wear the overnight bag at any time, but you should never wear the smaller leg bag at night. Follow the instructions below for how to empty, change, and clean your drainage bags.  Emptying the Drainage Bag   You must empty your drainage bag when it is ?-½ full or at least 2-3 times a day.  1. Wash your hands with soap and water.  2. Keep the drainage bag below your hips, below the level of your bladder. This stops urine from going back into the tubing and into your bladder.  3. Hold the dirty bag over the toilet or a clean container.  4. Open the pour spout at the bottom of the bag and empty the urine into the toilet or container. Do not let the pour spout touch the toilet, container, or any other surface. Doing so can place bacteria on the bag, which can cause an infection.  5. Clean the pour spout with a gauze pad or cotton ball that has rubbing alcohol on it.  6. Close the pour spout.  7. Attach the bag to your leg with adhesive tape or a leg strap.  8. Wash your hands well.  Changing the Drainage Bag  Change your drainage bag once a month or sooner if it starts to smell bad or look dirty. Below are steps to follow when changing the drainage bag.  1. Wash your hands with soap and water.  2. Pinch off the rubber catheter so that urine does not spill out.  3. Disconnect the catheter tube from the drainage tube   to clean the end of the drainage tube. 5. Connect the catheter tube to the drainage tube of the clean drainage bag. 6. Attach the new bag to the leg with adhesive tape or a leg strap. Avoid attaching the new bag too tightly. 7. Wash your hands well. Cleaning the Drainage Bag  1. Wash your hands with soap and water. 2. Wash the bag in warm, soapy water. 3. Rinse the bag thoroughly with warm water. 4. Fill the bag with a solution of white vinegar and water (1 cup vinegar to 1 qt warm water [.2 L vinegar to 1 L warm water]). Close the bag and soak it for 30 minutes in the solution. 5. Rinse the bag with warm water. 6. Hang the bag to dry with the pour  spout open and hanging downward. 7. Store the clean bag (once it is dry) in a clean plastic bag. 8. Wash your hands well. PREVENTING INFECTION  Wash your hands before and after handling your catheter.  Take showers daily and wash the area where the catheter enters your body. Do not take baths. Replace wet leg straps with dry ones, if this applies.  Do not use powders, sprays, or lotions on the genital area. Only use creams, lotions, or ointments as directed by your caregiver.  For females, wipe from front to back after each bowel movement.  Drink enough fluids to keep your urine clear or pale yellow unless you have a fluid restriction.  Do not let the drainage bag or tubing touch or lie on the floor.  Wear cotton underwear to absorb moisture and to keep your skin drier. SEEK MEDICAL CARE IF:   Your urine is cloudy or smells unusually bad.  Your catheter becomes clogged.  You are not draining urine into the bag or your bladder feels full.  Your catheter starts to leak. SEEK IMMEDIATE MEDICAL CARE IF:   You have pain, swelling, redness, or pus where the catheter enters the body.  You have pain in the abdomen, legs, lower back, or bladder.  You have a fever.  You see blood fill the catheter, or your urine is pink or red.  You have nausea, vomiting, or chills.  Your catheter gets pulled out. MAKE SURE YOU:   Understand these instructions.  Will watch your condition.  Will get help right away if you are not doing well or get worse. This information is not intended to replace advice given to you by your health care provider. Make sure you discuss any questions you have with your health care provider. Document Released: 08/25/2005 Document Revised: 01/09/2014 Document Reviewed: 08/11/2015 Elsevier Interactive Patient Education  2017 [facility], Care After This sheet gives you information about how to care for yourself after your procedure. Your  health care provider may also give you more specific instructions. If you have problems or questions, contact your health care provider. What can I expect after the procedure? For the first few weeks after the procedure:  You will feel a need to urinate often.  You may have blood in your urine.  You may feel a sudden need to urinate. Once your urinary catheter is removed, you may have a burning feeling when you urinate, especially at the end of urination. This feeling usually passes within 3-5 days. Follow these instructions at home: Activity  Return to your normal activities as told by your health care provider. Ask your health care provider what activities are safe for you.  Do  not do vigorous exercise for 1 week or as told by your health care provider.  Do not lift anything that is heavier than 10 lb (4.5 kg) until your health care provider say it is safe.  Avoid sexual activity for 4-6 weeks or as told by your health care provider.  Do not ride in a car for extended periods of time for 1 month or as told by your health care provider.  Do not drive for 24 hours if you were given a medicine to help you relax (sedative). Diet  Eat foods that are high in fiber, such as fresh fruits and vegetables, whole grains, and beans.  Drink enough fluid to keep your urine clear or pale yellow. Medicines  Take over-the-counter and prescription medicines, including stool softeners, only as told by your health care provider.  If you were prescribed an antibiotic medicine, take it as told by your health care provider. Do not stop taking the antibiotic even if you start to feel better. General instructions  If you were given elastic support stockings, wear them as told by your health care provider.  Do not strain to have a bowel movement. Straining may lead to bleeding from the prostate and cause clots to form and cause trouble urinating.  Keep all follow-up visits as told by your health care  provider. This is important. Contact a health care provider if:  You have a fever or chills.  You have spasms or pain with the urinary catheter still in place.  Once the catheter has been removed, you experience difficulty starting your stream when attempting to urinate. Get help right away if:  There is a blockage in your catheter.  Your catheter has been removed and you are suddenly unable to urinate.  Your urine smells unusually bad.  You start to have blood clots in your urine.  The blood in your urine becomes persistent or gets thick.  You develop chest pains.  You develop shortness of breath.  You develop swelling or pain in your leg. Summary  You may notice urinary symptoms for a few weeks after your procedure.  Follow instructions from your health care provider regarding activity restrictions such as lifting, exercise, and sexual activity.  Contact your health care provider if you have any unusual symptoms during your recovery. This information is not intended to replace advice given to you by your health care provider. Make sure you discuss any questions you have with your health care provider. Document Released: 08/25/2005 Document Revised: 04/11/2016 Document Reviewed: 04/11/2016 Elsevier Interactive Patient Education  2017 [facility] Instructions  Activity: Get plenty of rest for the remainder of the day. A responsible adult should stay with you for 24 hours following the procedure.  For the next 24 hours, DO NOT: -Drive a car -Paediatric nurse -Drink alcoholic beverages -Take any medication unless instructed by your physician -Make any legal decisions or sign important papers.  Meals: Start with liquid foods such as gelatin or soup. Progress to regular foods as tolerated. Avoid greasy, spicy, heavy foods. If nausea and/or vomiting occur, drink only clear liquids until the nausea and/or vomiting subsides. Call your physician  if vomiting continues.  Special Instructions/Symptoms: Your throat may feel dry or sore from the anesthesia or the breathing tube placed in your throat during surgery. If this causes discomfort, gargle with warm salt water. The discomfort should disappear within 24 hours.  If you had a scopolamine patch placed behind your ear for  of post- operative nausea and/or vomiting: ° °1. The medication in the patch is effective for 72 hours, after which it should be removed.  Wrap patch in a tissue and discard in the trash. Wash hands thoroughly with soap and water. °2. You may remove the patch earlier than 72 hours if you experience unpleasant side effects which may include dry mouth, dizziness or visual disturbances. °3. Avoid touching the patch. Wash your hands with soap and water after contact with the patch. °  ° ° °

## 2016-07-23 NOTE — Discharge Instructions (Signed)
Please keep an eye on the leg bag to make sure that he is continue to drain urine. Please take all of his meds as prescribed. Follow urology recommendations. Please follow up in the AM with urology. Please drink plenty of fluids. Return to the ED if your symptoms worsen.

## 2016-07-23 NOTE — ED Provider Notes (Signed)
[facility] DEPT Provider Note   CSN: [id number] Arrival date & time: 07/22/16  2052     History   Chief Complaint Chief Complaint  Patient presents with  . Urinary Retention    HPI Darrell Mcclain is a 75 y.o. male.  75 year old Caucasian male with a past medical history significant for CK D stage IV, BPH, hypertension, DM that presents to the ED today with catheter issues and urinary retention. Patient is completely by his wife. The patient had a TURP procedure for BPH by urology today. He can catheter inserted. Patient's wife states that this evening they noticed that there is no urine draining into the leg bag. They were concerned about patient and presented to the ED. Patient denied any pain. They were concerned that there might be a blood clot obstructing the Foley. The patient has been taking his Lasix this evening. They state patient has been drinking plenty of fluids as instructed by urology. He denies any pain or complaints at this time.       Past Medical History:  Diagnosis Date  . Anticoagulant long-term use    xarelto  . Arthritis    right hand  . At risk for sleep apnea    STOP-BANG==  6      PT ALREADY SET UP TO HAVE STUDY AT [facility] , PCP AWARE  . Atrial flutter, chronic [facility]) dx 01/ 2015   cardiologist-  dr Angelena Form  . Bilateral lower extremity edema   . BPH (benign prostatic hyperplasia)   . Cerebral microvascular disease    advanced and multiple foci throughout hemispheres and brain stem-- residual memory issues  . Chronic constipation   . Chronic kidney disease (CKD), stage III (moderate)    nephrologist-  dr Marval Regal [facility])  . Diastolic CHF, chronic ([place])   . Exertional dyspnea   . Foley catheter in place   . GERD (gastroesophageal reflux disease)   . Glaucoma, both eyes   . History of adenomatous polyp of colon    2012  . History of CVA (cerebrovascular accident)    (pt had no symptoms other than memory issues)  per MRI 11-29-2015  very small infarct right frontal lobe and multiple foci throughout hemispheres and brain stem  . 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 Hyperlipidemia   . Hypertension   . Iron deficiency anemia   . Memory loss   . RBBB (right bundle branch block with left anterior fascicular block)   . Rosacea   . Type 2 diabetes mellitus ([place])   . Urine retention   . Vitamin B 12 deficiency     Patient Active Problem List   Diagnosis Date Noted  . Falls 11/21/2015  . Memory loss 11/21/2015  . Uncontrolled diabetes mellitus ([place]) 11/21/2015  . Morbid obesity ([place]) 11/21/2015  . Hyperlipidemia 11/21/2015  . Essential hypertension 11/21/2015  . 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 Chronic diastolic CHF (congestive heart failure), NYHA class 2 ([place]) 02/28/2014  . Atrial flutter ([place]) 10/13/2013    Past Surgical History:  Procedure Laterality Date  . CATARACT EXTRACTION W/ INTRAOCULAR LENS  IMPLANT, BILATERAL  2016  . ORIF PROXIMAL HUMEROUS FX AND BICEPS TENODESIS  11/09/2015  . OTHER SURGICAL HISTORY  1996   reconstruction left toe (amputation repair)  . TONSILLECTOMY  1951  . TRANSTHORACIC ECHOCARDIOGRAM  08/23/2015   mild LVH, ef 55-60%, grade 1 diastolitc dysfunction  . [place]  Home Medications    Prior to Admission medications   Medication Sig Start Date End Date Taking? Authorizing Provider  acetaminophen (TYLENOL) 500 MG tablet Take 1,000 mg by mouth every 6 (six) hours as needed for mild pain, moderate pain, fever or headache.   Yes Historical Provider, MD  atorvastatin (LIPITOR) 20 MG tablet Take 1 tablet (20 mg total) by mouth daily. Patient taking differently: Take 20 mg by mouth every evening.  09/22/13  Yes Burnell Blanks, MD  cephALEXin (KEFLEX) 500 MG capsule Take 1 capsule (500 mg total) by mouth 2 (two) times daily. Patient taking differently: Take 500 mg by mouth 2 (two) times daily. Started 11/14  for 10 days 07/22/16  Yes Festus Aloe, MD  donepezil (ARICEPT) 10 MG tablet Take 1 tablet (10 mg total) by mouth at bedtime. 05/20/16  Yes Melvenia Beam, MD  dorzolamide (TRUSOPT) 2 % ophthalmic solution Place 1 drop into both eyes 2 (two) times daily.  06/20/15  Yes Historical Provider, MD  finasteride (PROSCAR) 5 MG tablet Take 5 mg by mouth every evening.  05/16/16  Yes Historical Provider, MD  furosemide (LASIX) 40 MG tablet Take two (2) tablets (80 mg total) by mouth each morning and take one (1) tablet (40 mg total) by mouth each afternoon. Patient taking differently: Take 40-80 mg by mouth 2 (two) times daily. Takes 80mg  in the morning and 40mg  in the evening 05/13/16  Yes Burnell Blanks, MD  insulin aspart (NOVOLOG) 100 UNIT/ML injection Inject 6 Units into the skin 3 (three) times daily before meals.    Yes Historical Provider, MD  insulin glargine (LANTUS) 100 UNIT/ML injection Inject 40 Units into the skin at bedtime.    Yes Historical Provider, MD  lisinopril (PRINIVIL,ZESTRIL) 20 MG tablet Take 20 mg by mouth daily.   Yes Historical Provider, MD  metFORMIN (GLUCOPHAGE) 1000 MG tablet Take 1,000 mg by mouth 2 (two) times daily with a meal.   Yes Historical Provider, MD  metoprolol succinate (TOPROL-XL) 50 MG 24 hr tablet Take 1 tablet (50 mg total) by mouth 2 (two) times daily. Take with or immediately following a meal. Patient taking differently: Take 50 mg by mouth daily. Take with or immediately following a meal. 04/04/15  Yes Burnell Blanks, MD  omeprazole (PRILOSEC) 20 MG capsule Take 20 mg by mouth every morning.  04/26/11  Yes Historical Provider, MD  polyethylene glycol (MIRALAX / GLYCOLAX) packet Take 17 g by mouth daily.   Yes Historical Provider, MD  PRESCRIPTION MEDICATION every 6 (six) weeks. Injections in eyes at dr's office   Yes Historical Provider, MD  rivaroxaban (XARELTO) 20 MG TABS tablet Take 1 tablet (20 mg total) by mouth daily with supper. 07/23/16   Yes Festus Aloe, MD  tamsulosin [facility]) 0.4 MG CAPS capsule Take 0.4 mg by mouth daily with breakfast.  01/09/14  Yes Historical Provider, MD    Family History Family History  Problem Relation Age of Onset  . Colon cancer Neg Hx   . Rectal cancer Neg Hx   . Stomach cancer Neg Hx   . CAD Neg Hx   . Dementia Neg Hx     Social History Social History  Substance Use Topics  . Smoking status: Former Smoker    Years: 20.00    Quit date: 05/09/1983  . Smokeless tobacco: Never Used  . Alcohol use No     Allergies   Ciprofloxacin and Oxycodone   Review of Systems Review of Systems  Constitutional: Negative for chills and fever.  HENT: Negative for congestion, ear pain, rhinorrhea and sore throat.   Eyes: Negative for pain and discharge.  Respiratory: Negative for cough and shortness of breath.   Cardiovascular: Negative for chest pain and palpitations.  Gastrointestinal: Negative for abdominal pain, diarrhea, nausea and vomiting.  Genitourinary: Positive for hematuria. Negative for flank pain, frequency and urgency.  Musculoskeletal: Negative for myalgias and neck pain.  Neurological: Negative for dizziness, syncope, weakness, light-headedness, numbness and headaches.  All other systems reviewed and are negative.    Physical Exam Updated Vital Signs BP (!) 104/54 (BP Location: Right Arm)   Pulse 67   Temp 98.3 F (36.8 C)   Resp 17   SpO2 93%   Physical Exam  Constitutional: He appears well-developed and well-nourished. No distress.  HENT:  Head: Normocephalic and atraumatic.  Mouth/Throat: Oropharynx is clear and moist.  Eyes: Conjunctivae are normal. Right eye exhibits no discharge. Left eye exhibits no discharge. No scleral icterus.  Neck: Normal range of motion. Neck supple. No thyromegaly present.  Cardiovascular: Normal rate, regular rhythm, normal heart sounds and intact distal pulses.   Pulmonary/Chest: Effort normal and breath sounds normal.  Abdominal:  Soft. Bowel sounds are normal. He exhibits no distension. There is no tenderness. There is no rebound and no guarding.  No abd pain or distention.  Genitourinary:  Genitourinary Comments: Foley catheter in place with no drainage of urine around the catheter. There is small amount of dried blood noted. There was 150 cc of dark urine in leg bag on my exam.   Musculoskeletal: Normal range of motion.  Lymphadenopathy:    He has no cervical adenopathy.  Neurological: He is alert.  Skin: Skin is warm and dry. Capillary refill takes less than 2 seconds.  Nursing note and vitals reviewed.    ED Treatments / Results  Labs (all labs ordered are listed, but only abnormal results are displayed) Labs Reviewed  CBG MONITORING, ED - Abnormal; Notable for the following:       Result Value   Glucose-Capillary 176 (*)    All other components within normal limits    EKG  EKG Interpretation None       Radiology No results found.  Procedures Procedures (including critical care time)  Medications Ordered in ED Medications - No data to display   Initial Impression / Assessment and Plan / ED Course  I have reviewed the triage vital signs and the nursing notes.  Pertinent labs & imaging results that were available during my care of the patient were reviewed by me and considered in my medical decision making (see chart for details).  Clinical Course   The patient presented to the ED today with concerns for urinary catheter issues and urinary retention. Patient had a TURP performed by urology today and catheter placed. On my exam patient had 150 mL of dark urine in the leg bag. Leg bag was drained. Bladder scanner was performed and unable to locate any urine in the bladder. This is likely positional. Patient's wife states that the urine is lighter than it was earlier today. I reexamined the patient after 1 hour and there was approximately 50 mL of urine in the leg bag. I do not feel that the Foley  catheter is clogged this time given that he is making urine and draining it. Patient does have CK D and probably produces minimal urine at baseline. Patient is on Lasix for edema and legs and did not  take his dose of medication this evening. Patient is not tachycardic. I feel the patient is not dehydrated this time. He is drinking by mouth fluids. Patient denies any pain. I discussed with the family that I do not feel that the urinary catheter is clogged at this time. I informed him to keep a close eye on his urine output this evening. If he has continued to have problems in the morning the need for follow-up with urology. Patient and wife is agreeable at bedside. The patient is hemodynamically stable and in no acute distress at this time. He feels safe for discharge home. I discussed the patient with Dr. Thomasene Lot who agrees with the above plan. I have encouraged by mouth fluid intake as instructed by urology. Pt is hemodynamically stable, in NAD, & able to ambulate in the ED. Pain has been managed & has no complaints prior to dc. Pt is comfortable with above plan and is stable for discharge at this time. All questions were answered prior to disposition. Strict return precautions for f/u to the ED were discussed.  Final Clinical Impressions(s) / ED Diagnoses   Final diagnoses:  Urinary retention    New Prescriptions Discharge Medication List as of 07/23/2016 12:28 AM       Doristine Devoid, PA-C 07/23/16 0201    Courteney Julio Alm, MD 07/23/16 1919

## 2016-07-24 ENCOUNTER — Encounter (HOSPITAL_BASED_OUTPATIENT_CLINIC_OR_DEPARTMENT_OTHER): Payer: Self-pay | Admitting: Urology

## 2016-08-19 ENCOUNTER — Other Ambulatory Visit: Payer: Self-pay | Admitting: Urology

## 2016-08-19 DIAGNOSIS — C61 Malignant neoplasm of prostate: Secondary | ICD-10-CM

## 2016-08-21 ENCOUNTER — Encounter: Payer: Self-pay | Admitting: Radiation Oncology

## 2016-08-22 ENCOUNTER — Encounter: Payer: Self-pay | Admitting: Radiation Oncology

## 2016-09-02 ENCOUNTER — Encounter (HOSPITAL_COMMUNITY)
Admission: RE | Admit: 2016-09-02 | Discharge: 2016-09-02 | Disposition: A | Discharge status: Home / Self Care | Payer: Medicare Other | Source: Ambulatory Visit | Attending: Urology | Admitting: Urology

## 2016-09-02 DIAGNOSIS — C61 Malignant neoplasm of prostate: Secondary | ICD-10-CM | POA: Diagnosis not present

## 2016-09-02 MED ORDER — TECHNETIUM TC 99M MEDRONATE IV KIT
20.9000 | PACK | Freq: Once | INTRAVENOUS | Status: AC | PRN
  Administered 2016-09-02: 20.9 via INTRAVENOUS

## 2016-09-03 ENCOUNTER — Encounter: Payer: Self-pay | Admitting: *Deleted

## 2016-09-09 NOTE — Progress Notes (Addendum)
GU Location of Tumor / Histology: prostatic adenocarcinoma  If Prostate Cancer, Gleason Score is (3 + 4)  08/2016 PSA  1.2 05/13/16  PSA  10.60 01/22/15 PSA  3.70 06/23/14 PSA  3.36 02/21/14 PSA  6.15 12/15/13  PSA  5.09 10/05/13 PSA  5.62  Darrell Mcclain presented with BPH and urinary retention.  Biopsies of prostate (if applicable) revealed:  Diagnosis 1. Prostate, needle biopsy(ies), right base lateral BENIGN PROSTATIC TISSUE 2. Prostate, needle biopsy(ies), right base medial BENIGN PROSTATIC TISSUE WITH NON NECROTIZING GRANULOMA 3. Prostate, needle biopsy(ies), right mid lateral BENIGN PROSTATIC TISSUE 4. Prostate, needle biopsy(ies), right mid medial BENIGN PROSTATIC TISSUE 5. Prostate, needle biopsy(ies), right apex lateral BENIGN PROSTATIC TISSUE 6. Prostate, needle biopsy(ies), right apex medial BENIGN PROSTATIC TISSUE 7. 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 PERINEURAL INVASION IDENTIFIED 8. Prostate, needle biopsy(ies), left base medial BENIGN PROSTATIC TISSUE 9. Prostate, needle biopsy(ies), left mid lateral BENIGN FIBROMUSCULAR TISSUE 10. Prostate, needle biopsy(ies), left mid medial BENIGN PROSTATIC TISSUE 11. Prostate, needle biopsy(ies), left apex lateral BENIGN PROSTATIC TISSUE 12. Prostate, needle biopsy(ies), left apex medial BENIGN PROSTATIC TISSUE   Past/Anticipated interventions by urology, if any: thulium laser TURP, bone scan (neg), CT pelvis (neg), referral to Dr. Tammi Klippel for consideration of brachytherapy.   Past/Anticipated interventions by medical oncology, if any: no  Weight changes, if any: no  Bowel/Bladder complaints, if any:  Mild ED. IPSS 10 with frequency, urgency, nocturia. Reports rare incomplete emptying and intermittency. Hx of urgency, retention, and incomplete emptying. Reports intermittent episodes of diarrhea since  TURP. Reports occasional dysuria. Denies hematuria, leakage or incontinence.   Nausea/Vomiting, if any: no  Pain issues, if any:  Generalized pain related to effects of arthritis.  SAFETY ISSUES:  Prior radiation? No  Pacemaker/ICD? No  Possible current pregnancy? no  Is the patient on methotrexate? no  Current Complaints / other details:  76 year old male. Prostate volume: 78.5 cc. NKDA.

## 2016-09-10 ENCOUNTER — Encounter: Payer: Self-pay | Admitting: Radiation Oncology

## 2016-09-10 ENCOUNTER — Ambulatory Visit
Admission: RE | Admit: 2016-09-10 | Discharge: 2016-09-10 | Disposition: A | Discharge status: Home / Self Care | Payer: Medicare Other | Source: Ambulatory Visit | Attending: Radiation Oncology | Admitting: Radiation Oncology

## 2016-09-10 VITALS — BP 128/71 | HR 70 | Resp 18 | Wt 241.4 lb

## 2016-09-10 DIAGNOSIS — Z794 Long term (current) use of insulin: Secondary | ICD-10-CM | POA: Diagnosis not present

## 2016-09-10 DIAGNOSIS — R339 Retention of urine, unspecified: Secondary | ICD-10-CM | POA: Diagnosis not present

## 2016-09-10 DIAGNOSIS — K219 Gastro-esophageal reflux disease without esophagitis: Secondary | ICD-10-CM | POA: Diagnosis not present

## 2016-09-10 DIAGNOSIS — I130 Hypertensive heart and chronic kidney disease with heart failure and stage 1 through stage 4 chronic kidney disease, or unspecified chronic kidney disease: Secondary | ICD-10-CM | POA: Insufficient documentation

## 2016-09-10 DIAGNOSIS — Z87891 Personal history of nicotine dependence: Secondary | ICD-10-CM | POA: Diagnosis not present

## 2016-09-10 DIAGNOSIS — E539 Vitamin B deficiency, unspecified: Secondary | ICD-10-CM | POA: Diagnosis not present

## 2016-09-10 DIAGNOSIS — C61 Malignant neoplasm of prostate: Secondary | ICD-10-CM | POA: Diagnosis not present

## 2016-09-10 DIAGNOSIS — I5032 Chronic diastolic (congestive) heart failure: Secondary | ICD-10-CM | POA: Insufficient documentation

## 2016-09-10 DIAGNOSIS — Z809 Family history of malignant neoplasm, unspecified: Secondary | ICD-10-CM | POA: Diagnosis not present

## 2016-09-10 DIAGNOSIS — Z8673 Personal history of transient ischemic attack (TIA), and cerebral infarction without residual deficits: Secondary | ICD-10-CM | POA: Insufficient documentation

## 2016-09-10 DIAGNOSIS — N183 Chronic kidney disease, stage 3 (moderate): Secondary | ICD-10-CM | POA: Diagnosis not present

## 2016-09-10 DIAGNOSIS — Z79899 Other long term (current) drug therapy: Secondary | ICD-10-CM | POA: Insufficient documentation

## 2016-09-10 DIAGNOSIS — Z9889 Other specified postprocedural states: Secondary | ICD-10-CM | POA: Diagnosis not present

## 2016-09-10 DIAGNOSIS — Z888 Allergy status to other drugs, medicaments and biological substances status: Secondary | ICD-10-CM | POA: Insufficient documentation

## 2016-09-10 DIAGNOSIS — R972 Elevated prostate specific antigen [PSA]: Secondary | ICD-10-CM | POA: Diagnosis not present

## 2016-09-10 DIAGNOSIS — E1122 Type 2 diabetes mellitus with diabetic chronic kidney disease: Secondary | ICD-10-CM | POA: Diagnosis not present

## 2016-09-10 HISTORY — DX: Malignant neoplasm of prostate: C61

## 2016-09-10 NOTE — Progress Notes (Signed)
See progress note under physician encounter. 

## 2016-09-10 NOTE — Progress Notes (Signed)
Radiation Oncology         ([phone]) [phone] ________________________________  Initial Outpatient Consultation  Name: Darrell Mcclain MRN: [id number]  Date: 09/10/2016  DOB: 12-02-1940  CZ:YSAYTKZ,SWFUXNA A, MD  Darrell Aloe, MD   REFERRING PHYSICIAN: Festus Aloe, MD  DIAGNOSIS: 76 y.o. 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 Malignant neoplasm of prostate ([place]) [name] is a 76 y.o. gentleman.  He was noted to have urinary retention and an elevated PSA of 10.60 by his primary care physician, Dr. Reynaldo Mcclain. He saw Dr. Junious Mcclain on 06/26/16 and digital rectal examination was performed at that time revealed moderate hyperplasia - trilobar. He was planning to proceed with repeat evaluation as this was felt to be prostatitis. He developed urinary retention as well and ultimately was catheterized and Finasteride was used to try to shrink the prostate. He presented more urgently with retention, and was counseled on the role of laser TURP. The patient proceeded to the operating room for this as well as transrectal ultrasound with 12 biopsies of the prostate on 07/22/16.  The prostate volume measured 78.5 cc.  Out of 12 core biopsies, 1 was positive.  The maximum Gleason score was 3+4=7, and this was seen in left base lateral and perineural invasion was identified. He did undergo metastatic work up with CT and bone scan on 09/02/16 showed no evidence of metastatic disease.  The patient reviewed the biopsy results with his urologist and he has kindly been referred today for discussion of potential radiation treatment options.   PREVIOUS RADIATION THERAPY: No  PAST MEDICAL HISTORY:  Past Medical History:  Diagnosis Date  . Anticoagulant long-term use    xarelto  . Arthritis    right hand  . At risk for sleep apnea    STOP-BANG==  6      PT ALREADY SET UP  TO HAVE STUDY AT [facility] , PCP AWARE  . Atrial flutter, chronic [facility]) dx 01/ 2015   cardiologist-  dr Darrell Mcclain  . Bilateral lower extremity edema   . BPH (benign prostatic hyperplasia)   . Cerebral microvascular disease    advanced and multiple foci throughout hemispheres and brain stem-- residual memory issues  . Chronic constipation   . Chronic kidney disease (CKD), stage III (moderate)    nephrologist-  [facility])  . Diastolic CHF, chronic ([place])   . Exertional dyspnea   . Foley catheter in place   . GERD (gastroesophageal reflux disease)   . Glaucoma, both eyes   . History of adenomatous polyp of colon    2012  . History of CVA (cerebrovascular accident)    (pt had no symptoms other than memory issues) per MRI 11-29-2015  very small infarct right frontal lobe and multiple foci throughout hemispheres and brain stem  . 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 Hyperlipidemia   . Hypertension   . Iron deficiency anemia   . Memory loss   . Prostate cancer ([place])   . RBBB (right bundle branch block with left anterior fascicular block)   . Rosacea   . Type 2 diabetes mellitus ([place])   . Urine retention   . Vitamin B 12 deficiency     PAST SURGICAL HISTORY: Past Surgical History:  Procedure Laterality Date  . CATARACT EXTRACTION W/  INTRAOCULAR LENS  IMPLANT, BILATERAL  2016  . ORIF PROXIMAL HUMEROUS FX AND BICEPS TENODESIS  11/09/2015  . OTHER SURGICAL HISTORY  1996   reconstruction left toe (amputation repair)  . PROSTATE BIOPSY N/A 07/22/2016   Procedure: BIOPSY TRANSRECTAL ULTRASONIC PROSTATE (TUBP);  Surgeon: Darrell Aloe, MD;  Location: [facility];  Service: Urology;  Laterality: N/A;  . TONSILLECTOMY  1951  . TRANSTHORACIC ECHOCARDIOGRAM  08/23/2015   mild LVH, ef 55-60%, grade 1 diastolitc dysfunction  . WISDOM TOOTH EXTRACTION  1985    FAMILY HISTORY: family history includes Cancer in his  sister.  SOCIAL HISTORY:  reports that he quit smoking about 33 years ago. He has a 40.00 pack-year smoking history. He has never used smokeless tobacco. He reports that he does not drink alcohol or use drugs. The patient is divorced and accompanied by his ex wife. They have adult children. He is retired from working for a [employer].  ALLERGIES: Ciprofloxacin and Oxycodone  MEDICATIONS:  Current Outpatient Prescriptions  Medication Sig Dispense Refill  . atorvastatin (LIPITOR) 20 MG tablet Take 1 tablet (20 mg total) by mouth daily. (Patient taking differently: Take 20 mg by mouth every evening. ) 30 tablet 3  . donepezil (ARICEPT) 10 MG tablet Take 1 tablet (10 mg total) by mouth at bedtime. 30 tablet 12  . dorzolamide (TRUSOPT) 2 % ophthalmic solution Place 1 drop into both eyes 2 (two) times daily.   3  . finasteride (PROSCAR) 5 MG tablet Take 5 mg by mouth every evening.   3  . insulin aspart (NOVOLOG) 100 UNIT/ML injection Inject 6 Units into the skin 3 (three) times daily before meals.     . insulin glargine (LANTUS) 100 UNIT/ML injection Inject 40 Units into the skin at bedtime.     Marland Kitchen lisinopril (PRINIVIL,ZESTRIL) 20 MG tablet Take 20 mg by mouth daily.    . metFORMIN (GLUCOPHAGE) 1000 MG tablet Take 1,000 mg by mouth 2 (two) times daily with a meal.    . metoprolol succinate (TOPROL-XL) 50 MG 24 hr tablet Take 1 tablet (50 mg total) by mouth 2 (two) times daily. Take with or immediately following a meal. (Patient taking differently: Take 50 mg by mouth daily. Take with or immediately following a meal.) 60 tablet 11  . omeprazole (PRILOSEC) 20 MG capsule Take 20 mg by mouth every morning.     . polyethylene glycol (MIRALAX / GLYCOLAX) packet Take 17 g by mouth daily.    Marland Kitchen PRESCRIPTION MEDICATION every 6 (six) weeks. Injections in eyes at dr's office    . rivaroxaban (XARELTO) 20 MG TABS tablet Take 1 tablet (20 mg total) by mouth daily with supper. 30 tablet 6  . tamsulosin  (FLOMAX) 0.4 MG CAPS capsule Take 0.4 mg by mouth daily with breakfast.     . acetaminophen (TYLENOL) 500 MG tablet Take 1,000 mg by mouth every 6 (six) hours as needed for mild pain, moderate pain, fever or headache.    . furosemide (LASIX) 40 MG tablet Take two (2) tablets (80 mg total) by mouth each morning and take one (1) tablet (40 mg total) by mouth each afternoon. (Patient taking differently: Take 40-80 mg by mouth 2 (two) times daily. Takes 80mg  in the morning and 40mg  in the evening) 270 tablet 1   No current facility-administered medications for this encounter.     REVIEW OF SYSTEMS:  On review of systems, the patient reports that he is doing well overall. He denies  any chest pain, shortness of breath, cough, fevers, chills, night sweats, unintended weight changes. He denies any bowel disturbances, and denies abdominal pain, nausea or vomiting. He denies any new musculoskeletal or joint aches or pains. The patient completed an IPSS and IIEF questionnaire.  His IPSS score was 10 indicating moderate urinary outflow obstructive symptoms.  He indicated that his erectile function is able to complete sexual activity on most attempts.   PHYSICAL EXAM:  weight is 241 lb 6.4 oz (109.5 kg). His blood pressure is 128/71 and his pulse is 70. His respiration is 18 and oxygen saturation is 100%.   In general this is a well appearing caucasian male in no acute distress. He is alert and oriented x4 and appropriate throughout the examination. HEENT reveals that the patient is normocephalic, atraumatic. EOMs are intact. PERRLA. Skin is intact without any evidence of gross lesions. Cardiovascular exam reveals a regular rate and rhythm, no clicks rubs or murmurs are auscultated. Chest is clear to auscultation bilaterally. Lymphatic assessment is performed and does not reveal any adenopathy in the cervical, supraclavicular, axillary, or inguinal chains. Abdomen has active bowel sounds in all quadrants and is intact.  The abdomen is soft, non tender, non distended. Lower extremities are negative for pretibial pitting edema, deep calf tenderness, cyanosis or clubbing.  KPS = 90  100 - Normal; no complaints; no evidence of disease. 90   - Able to carry on normal activity; minor signs or symptoms of disease. 80   - Normal activity with effort; some signs or symptoms of disease. 64   - Cares for self; unable to carry on normal activity or to do active work. 60   - Requires occasional assistance, but is able to care for most of his personal needs. 50   - Requires considerable assistance and frequent medical care. 49   - Disabled; requires special care and assistance. 29   - Severely disabled; hospital admission is indicated although death not imminent. 33   - Very sick; hospital admission necessary; active supportive treatment necessary. 10   - Moribund; fatal processes progressing rapidly. 0     - Dead  Karnofsky DA, Abelmann Vera, Craver LS and Burchenal [facility] [phone]) The use of the nitrogen mustards in the palliative treatment of carcinoma: with particular reference to bronchogenic carcinoma Cancer 1 [id number]   LABORATORY DATA:  Lab Results  Component Value Date   HGB 12.2 (L) 07/22/2016   HCT 36.0 (L) 07/22/2016   Lab Results  Component Value Date   NA 142 07/22/2016   K 3.6 07/22/2016   CL 101 07/22/2016   CO2 29 08/20/2015   No results found for: ALT, AST, GGT, ALKPHOS, BILITOT   RADIOGRAPHY: Nm Bone Scan Whole Body  Result Date: 09/02/2016 CLINICAL DATA:  Prostate cancer. PSA 3.7. Fall 10 months ago with left shoulder injury. EXAM: NUCLEAR MEDICINE WHOLE BODY BONE SCAN TECHNIQUE: Whole body anterior and posterior images were obtained approximately 3 hours after intravenous injection of radiopharmaceutical. RADIOPHARMACEUTICALS:  20.9 mCi Technetium-89m MDP IV COMPARISON:  None. FINDINGS: Asymmetric activity in the proximal left humerus which correlates with history of fall and injury 10 months ago.  Aligned anterior left rib activity which is likely accentuated by rotation, correlates with history of fall and subsequent left rib pain. Asymmetric pelvic activity anteriorly attributed to rotation and right-sided attenuation from pannus. No activity suspicious for metastatic disc disease. Degenerative changes noted in the appendicular skeleton. Symmetric renal activity and bladder activity. IMPRESSION: 1. No evidence  of metastatic disease. 2. Arthritic and posttraumatic activity described above. Electronically Signed   By: Monte Fantasia M.D.   On: 09/02/2016 14:22      IMPRESSION/PLAN:  1.  76 y.o. 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  His T-Stage, Gleason's Score, and PSA put him into the intermediate risk group. We discussed the natural history of prostate cancer.  We reviewed the the implications of T-stage, Gleason's Score, and PSA on decision-making and outcomes in prostate cancer.  Given the patient's higher age, history of CVA, Afib, use of blood thinners, recent TURP procedure, and a larger prostate gland size of 78.5 cc, he is not a candidate for prostatectomy or radioactive seed implant. Accordingly he is eligible for external beam radiation. We discussed the risks, benefits, short, and long term effects of treatment. He is interested in moving forward, and the delivery and logistics of treatment were reviewed. He is offered 8 weeks of external radiotherapy. The patient expressed interest in this, and  would like to proceed. We will share our discussion with Dr. Junious Mcclain and move forward with scheduling placement of three gold fiducial markers into the prostate to proceed with IMRT in the near future. We enjoyed meeting with him today and will look forward to participating in the care of this very nice gentleman.  The above documentation reflects my direct findings during this shared patient visit. Please see the separate note  by Dr. Tammi Klippel on this date for the remainder of the patient's plan of care.     Carola Rhine, PAC    This document serves as a record of services personally performed by Shona Simpson, PAC and Tyler Pita, MD. It was created on their behalf by Darcus Austin, a trained medical scribe. The creation of this record is based on the scribe's personal observations and the provider's statements to them. This document has been checked and approved by the attending provider.

## 2016-09-11 ENCOUNTER — Ambulatory Visit: Payer: Self-pay

## 2016-09-11 ENCOUNTER — Ambulatory Visit: Payer: Medicare Other | Admitting: Radiation Oncology

## 2016-09-15 ENCOUNTER — Other Ambulatory Visit: Payer: Self-pay | Admitting: Cardiovascular Disease

## 2016-09-15 ENCOUNTER — Telehealth: Payer: Self-pay | Admitting: Radiation Oncology

## 2016-09-15 ENCOUNTER — Ambulatory Visit: Payer: Medicare Other

## 2016-09-15 ENCOUNTER — Ambulatory Visit: Payer: Medicare Other | Admitting: Radiation Oncology

## 2016-09-15 DIAGNOSIS — I5032 Chronic diastolic (congestive) heart failure: Secondary | ICD-10-CM

## 2016-09-15 NOTE — Telephone Encounter (Signed)
Rx refill sent to pharmacy. 

## 2016-09-15 NOTE — Telephone Encounter (Addendum)
LM for pt's ex wife as she had questions.   The patient's ex wife called back and we discussed side effects of treatment, she asked that we go ahead and set up the fiducial markers with Dr. Junious Silk. I called and the patient, and we will need to set him up for fiducial markers and sim. I have reached out to Dr. Lyndal Rainbow office to get this set up and will get a call back from them about setting up the marker placement.

## 2016-09-16 ENCOUNTER — Telehealth: Payer: Self-pay | Admitting: Radiation Oncology

## 2016-09-16 NOTE — Telephone Encounter (Signed)
I spoke with the patient's ex wife. Darrell Mcclain to see Dr. Junious Silk on 1/31 for a postop visit since his TURP, and will have fiducial markers placed on 11/04/16. He will come for simulation on 11/07/16.

## 2016-09-24 ENCOUNTER — Ambulatory Visit: Payer: Medicare Other | Admitting: Cardiovascular Disease

## 2016-09-29 ENCOUNTER — Ambulatory Visit: Payer: Medicare Other | Admitting: Cardiovascular Disease

## 2016-09-30 ENCOUNTER — Encounter: Payer: Self-pay | Admitting: Cardiovascular Disease

## 2016-10-17 ENCOUNTER — Encounter: Payer: Self-pay | Admitting: Radiation Oncology

## 2016-10-17 NOTE — Progress Notes (Signed)
Documentation:  EPIC inbasket message.  Viviana Simpler, MD  Cc: Heywood Footman, RN        Hi Dr. Tammi Klippel,   I received a phone call from [facility] regarding the above patient, this patient has decided against having fiducial markers, he has decided to do surveillance for now.   Enid Derry

## 2016-10-23 ENCOUNTER — Telehealth: Payer: Self-pay | Admitting: Medical Oncology

## 2016-10-23 NOTE — Progress Notes (Signed)
I was not able to meet Mr. Darrell Mcclain the day he consults with Dr. Tammi Klippel. I attempted to follow up with him but was not able to leave a message. Today I spoke with him and introduced myself as the navigator for the prostate patients. After his visit with Dr. Tammi Klippel he was leaning towards radiation. He met again with Dr. Junious Silk 10/08/16 and he has decided to do active surveillance. He will have a follow up visit 01/28/17 with Dr. Junious Silk with PSA.

## 2016-11-07 ENCOUNTER — Ambulatory Visit: Payer: Medicare Other | Admitting: Radiation Oncology

## 2016-11-18 ENCOUNTER — Ambulatory Visit: Payer: Medicare Other | Admitting: Neurology

## 2016-12-15 ENCOUNTER — Telehealth: Payer: Self-pay

## 2016-12-15 NOTE — Telephone Encounter (Signed)
Patient called to schedule sleep study. Said he would call back to shcedule. Have not heard back. Left 2 messages and have not heard back.

## 2017-02-12 ENCOUNTER — Telehealth: Payer: Self-pay

## 2017-02-12 NOTE — Telephone Encounter (Signed)
Received faxed OV notes from 01/28/17 visit at [facility]. Dr.Coladonato checked renal labs which were wnl except: glucose 115 (H), BUN 30 (H), creatinine 1.5 (H), GFR 45 (L), CO2 34 (H). UA w/ trace occult blood, urobilinogen 2 (H) and reported that pt "has been very appropriately managed by Dr. [name] w/ ACE inhibition and BP control although he admits to poor control of diabetes due to non adherence with diet. However, today w/ low BP and bradycardia, will decrease his metoprolol ER from 50 mg to 25 mg hs, and lisinopril 20 mg to 10 mg hs. Recheck BP and wt w/ f/u visit 2 wks." Sent to med records for scanning, copy to Dr. Jaynee Eagles for review.

## 2017-03-30 ENCOUNTER — Other Ambulatory Visit: Payer: Self-pay | Admitting: Cardiovascular Disease

## 2017-03-30 DIAGNOSIS — I5032 Chronic diastolic (congestive) heart failure: Secondary | ICD-10-CM

## 2017-04-23 ENCOUNTER — Telehealth: Payer: Self-pay | Admitting: Cardiovascular Disease

## 2017-05-14 ENCOUNTER — Ambulatory Visit: Payer: Medicare Other | Admitting: Cardiology

## 2017-07-02 ENCOUNTER — Ambulatory Visit (INDEPENDENT_AMBULATORY_CARE_PROVIDER_SITE_OTHER): Payer: Medicare Other | Admitting: Cardiovascular Disease

## 2017-07-02 VITALS — BP 126/72 | HR 66 | Ht 69.0 in | Wt 238.0 lb

## 2017-07-02 DIAGNOSIS — I5032 Chronic diastolic (congestive) heart failure: Secondary | ICD-10-CM | POA: Diagnosis not present

## 2017-07-02 DIAGNOSIS — I10 Essential (primary) hypertension: Secondary | ICD-10-CM

## 2017-07-02 DIAGNOSIS — I4892 Unspecified atrial flutter: Secondary | ICD-10-CM | POA: Diagnosis not present

## 2017-07-02 DIAGNOSIS — E7800 Pure hypercholesterolemia, unspecified: Secondary | ICD-10-CM

## 2017-07-02 NOTE — Patient Instructions (Signed)

## 2017-07-02 NOTE — Progress Notes (Signed)
Chief Complaint  Patient presents with  . Follow-up    CHF    History of Present Illness: 76 yo male with history of chronic diastolic CHF, DM, chronic kidney disease, HLD, HTN, OA, GERD, BPH and atrial fibrillation/flutter who is here today for cardiac follow up. He was seen as a new patient in January of 2015 for assessment of his new onset atrial flutter. He has been rated controlled on Cardizem and anti-coagulated with Xarelto. Echo on 10/06/13 with normal LV size and function, no significant valvular disease. He has been on Lasix for management of volume given chronic diastolic CHF. Norvasc stopped due to LE edema. Echo 08/23/15 with normal LV systolic function, grade 1 diastolic function. No valve disease. His CKD is followed in Nephrology. He has had a recent prostate procedure for prostate cancer.   He is here today for follow up. The patient denies any chest pain, dyspnea, palpitations, orthopnea, PND, dizziness, near syncope or syncope. LE edema is stable.   Primary Care Physician: Burnard Bunting, MD   Past Medical History:  Diagnosis Date  . Anticoagulant long-term use    xarelto  . Arthritis    right hand  . At risk for sleep apnea    STOP-BANG==  6      PT ALREADY SET UP TO HAVE STUDY AT [facility] , PCP AWARE  . Atrial flutter, chronic [facility]) dx 01/ 2015   cardiologist-  dr Angelena Form  . Bilateral lower extremity edema   . BPH (benign prostatic hyperplasia)   . Cerebral microvascular disease    advanced and multiple foci throughout hemispheres and brain stem-- residual memory issues  . Chronic constipation   . Chronic kidney disease (CKD), stage III (moderate)    nephrologist-  dr Marval Regal [facility])  . Diastolic CHF, chronic ([place])   . Exertional dyspnea   . Foley catheter in place   . GERD (gastroesophageal reflux disease)   . Glaucoma, both eyes   . History of adenomatous polyp of colon    2012  . History of CVA (cerebrovascular accident)     (pt had no symptoms other than memory issues) per MRI 11-29-2015  very small infarct right frontal lobe and multiple foci throughout hemispheres and brain stem  . 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 Hyperlipidemia   . Hypertension   . Iron deficiency anemia   . Memory loss   . Prostate cancer ([place])   . RBBB (right bundle branch block with left anterior fascicular block)   . Rosacea   . Type 2 diabetes mellitus ([place])   . Urine retention   . Vitamin B 12 deficiency     Past Surgical History:  Procedure Laterality Date  . CATARACT EXTRACTION W/ INTRAOCULAR LENS  IMPLANT, BILATERAL  2016  . ORIF PROXIMAL HUMEROUS FX AND BICEPS TENODESIS  11/09/2015  . OTHER SURGICAL HISTORY  1996   reconstruction left toe (amputation repair)  . PROSTATE BIOPSY N/A 07/22/2016   Procedure: BIOPSY TRANSRECTAL ULTRASONIC PROSTATE (TUBP);  Surgeon: Festus Aloe, MD;  Location: [facility];  Service: Urology;  Laterality: N/A;  . TONSILLECTOMY  1951  . TRANSTHORACIC ECHOCARDIOGRAM  08/23/2015   mild LVH, ef 55-60%, grade 1 diastolitc dysfunction  . WISDOM TOOTH EXTRACTION  1985    Current Outpatient Prescriptions  Medication Sig Dispense Refill  . acetaminophen (TYLENOL) 500 MG tablet Take 1,000 mg by mouth every 6 (six) hours as needed for mild  pain, moderate pain, fever or headache.    Marland Kitchen atorvastatin (LIPITOR) 20 MG tablet Take 1 tablet (20 mg total) by mouth daily. (Patient taking differently: Take 20 mg by mouth every evening. ) 30 tablet 3  . donepezil (ARICEPT) 10 MG tablet Take 1 tablet (10 mg total) by mouth at bedtime. 30 tablet 12  . dorzolamide (TRUSOPT) 2 % ophthalmic solution Place 1 drop into both eyes 2 (two) times daily.   3  . finasteride (PROSCAR) 5 MG tablet Take 5 mg by mouth every evening.   3  . furosemide (LASIX) 40 MG tablet Take two (2) tablets (80 mg total) by mouth each morning and take one (1) tablet (40 mg total) by mouth each afternoon. 270  tablet 0  . insulin aspart (NOVOLOG) 100 UNIT/ML injection Inject 6 Units into the skin 3 (three) times daily before meals.     . insulin glargine (LANTUS) 100 UNIT/ML injection Inject 40 Units into the skin at bedtime.     Marland Kitchen lisinopril (PRINIVIL,ZESTRIL) 20 MG tablet Take 10 mg by mouth daily.     . metFORMIN (GLUCOPHAGE) 1000 MG tablet Take 1,000 mg by mouth 2 (two) times daily with a meal.    . metoprolol succinate (TOPROL-XL) 50 MG 24 hr tablet Take 1 tablet (50 mg total) by mouth 2 (two) times daily. Take with or immediately following a meal. (Patient taking differently: Take 25 mg by mouth daily. Take with or immediately following a meal.) 60 tablet 11  . omeprazole (PRILOSEC) 20 MG capsule Take 20 mg by mouth every morning.     . polyethylene glycol (MIRALAX / GLYCOLAX) packet Take 17 g by mouth daily.    Marland Kitchen PRESCRIPTION MEDICATION every 6 (six) weeks. Injections in eyes at dr's office    . rivaroxaban (XARELTO) 20 MG TABS tablet Take 1 tablet (20 mg total) by mouth daily with supper. 30 tablet 6  . tamsulosin (FLOMAX) 0.4 MG CAPS capsule Take 0.4 mg by mouth daily with breakfast.      No current facility-administered medications for this visit.     Allergies  Allergen Reactions  . Ciprofloxacin Diarrhea  . Oxycodone Other (See Comments)    "confusion and loopy"    Social History   Social History  . Marital status: Divorced    Spouse name: N/A  . Number of children: N/A  . Years of education: N/A   Occupational History  . Not on file.   Social History Main Topics  . Smoking status: Former Smoker    Packs/day: 2.00    Years: 20.00    Quit date: 05/09/1983  . Smokeless tobacco: Never Used  . Alcohol use No  . Drug use: No  . Sexual activity: Not Currently   Other Topics Concern  . Not on file   Social History Narrative   Lives at [place] through rehab after falling and breaking right shoulder. Phone: [phone] Fax: [phone]           Family History  Problem Relation Age of Onset  . Cancer Sister        lung to brain/smoker  . Colon cancer Neg Hx   . Rectal cancer Neg Hx   . Stomach cancer Neg Hx   . CAD Neg Hx   . Dementia Neg Hx     Review of Systems:  As stated in the HPI and otherwise negative.   BP 126/72   Pulse 66   Ht 5\' 9"  (1.753  m)   Wt 238 lb (108 kg)   SpO2 94%   BMI 35.15 kg/m   Physical Examination:  General: Well developed, well nourished, NAD  HEENT: OP clear, mucus membranes moist  SKIN: warm, dry. No rashes. Neuro: No focal deficits  Musculoskeletal: Muscle strength 5/5 all ext  Psychiatric: Mood and affect normal  Neck: No JVD, no carotid bruits, no thyromegaly, no lymphadenopathy.  Lungs:Clear bilaterally, no wheezes, rhonci, crackles Cardiovascular: Irreg. No murmurs, gallops or rubs. Abdomen:Soft. Bowel sounds present. Non-tender.  Extremities: 1+ bilateral lower extremity edema. Pulses are 2 + in the bilateral DP/PT.   Echo 08/23/15: Left ventricle: The cavity size was normal. Wall thickness was increased in a pattern of mild LVH. Systolic function was normal. The estimated ejection fraction was in the range of 55% to 60%. Wall motion was normal; there were no regional wall motion abnormalities. Doppler parameters are consistent with abnormal left ventricular relaxation (grade 1 diastolic dysfunction). Impressions: - Normal LV systolic function; mild LVH; grade 1 diastolic dysfunction.  EKG:  EKG is ordered today. The ekg ordered today demonstrates atrial flutter, rate 66 bpm. PVC. RBBB.   Recent Labs: 07/22/2016: BUN 30; Creatinine, Ser 1.80; Hemoglobin 12.2; Potassium 3.6; Sodium 142   Lipid Panel No results found for: CHOL, TRIG, HDL, CHOLHDL, VLDL, LDLCALC, LDLDIRECT   Wt Readings from Last 3 Encounters:  07/02/17 238 lb (108 kg)  09/10/16 241 lb 6.4 oz (109.5 kg)  07/22/16 243 lb (110.2 kg)     Other studies Reviewed: Additional studies/  records that were reviewed today include: . Review of the above records demonstrates:    Assessment and Plan:   1. Atrial flutter, paroxysmal:  He is in atrial flutter. Rate is controlled. Will continue Toprol and Xarelto. He had a CBC and BMET in Nephrology clinic 06/10/17. See scanned Nephrology note.   2. Chronic diastolic CHF: Weight stable on Lasix. I have asked him to avoid salty foods. He is asked to elevated his legs during the day.     3. HTN: BP controlled. No changes.   4. HLD: Continue statin. Lipids followed in primary care.   Current medicines are reviewed at length with the patient today.  The patient does not have concerns regarding medicines.  The following changes have been made:  no change  Labs/ tests ordered today include:   Orders Placed This Encounter  Procedures  . EKG 12-Lead    Disposition:   FU with me in 12 months.    Signed, Lauree Chandler, MD 07/02/2017 3:04 PM    [facility] [address], [place], [place]  [address] Phone: [phone]; Fax: [phone]

## 2017-08-10 ENCOUNTER — Other Ambulatory Visit: Payer: Self-pay | Admitting: Urology

## 2017-08-10 DIAGNOSIS — C61 Malignant neoplasm of prostate: Secondary | ICD-10-CM

## 2017-08-12 ENCOUNTER — Other Ambulatory Visit: Payer: Self-pay | Admitting: Neurology

## 2017-08-26 ENCOUNTER — Ambulatory Visit
Admission: RE | Admit: 2017-08-26 | Discharge: 2017-08-26 | Disposition: A | Discharge status: Home / Self Care | Payer: Medicare Other | Source: Ambulatory Visit | Attending: Urology | Admitting: Urology

## 2017-08-26 DIAGNOSIS — C61 Malignant neoplasm of prostate: Secondary | ICD-10-CM

## 2017-08-26 MED ORDER — GADOBENATE DIMEGLUMINE 529 MG/ML IV SOLN
20.0000 mL | Freq: Once | INTRAVENOUS | Status: AC | PRN
  Administered 2017-08-26: 20 mL via INTRAVENOUS

## 2017-09-29 ENCOUNTER — Other Ambulatory Visit: Payer: Self-pay | Admitting: Cardiovascular Disease

## 2017-09-29 DIAGNOSIS — I5032 Chronic diastolic (congestive) heart failure: Secondary | ICD-10-CM

## 2017-11-03 ENCOUNTER — Encounter: Payer: Self-pay | Admitting: Podiatry

## 2017-11-03 ENCOUNTER — Ambulatory Visit: Payer: Medicare Other | Admitting: Podiatry

## 2017-11-03 VITALS — BP 162/74 | HR 66

## 2017-11-03 DIAGNOSIS — B351 Tinea unguium: Secondary | ICD-10-CM | POA: Diagnosis not present

## 2017-11-03 DIAGNOSIS — E1159 Type 2 diabetes mellitus with other circulatory complications: Secondary | ICD-10-CM

## 2017-11-03 DIAGNOSIS — M79675 Pain in left toe(s): Secondary | ICD-10-CM

## 2017-11-03 DIAGNOSIS — M79674 Pain in right toe(s): Secondary | ICD-10-CM

## 2017-11-03 NOTE — Progress Notes (Signed)
   Subjective:    Patient ID: Darrell Mcclain, male    DOB: 1941/06/16, 77 y.o.   MRN: [id number]  HPIthis patient presents the office with chief complaint of long thick nails.  Patient states that he has his nails done by the golf at the salon.  He says his nails have grown long and thick and are painful walking and wearing his shoes. E says he is unable to self treat.  Patient is taking xarelto..  This patientalso is diabetic and taking insulin and metformin.  He presents the office today for an evaluation and treatment of his painful nails and a diabetic foot exam     Review of Systems  All other systems reviewed and are negative.      Objective:   Physical Exam General Appearance  Alert, conversant and in no acute stress.  Vascular  Dorsalis pedis and posterior pulses are not  palpable  bilaterally.  Capillary return is within normal limits  bilaterally. Temperature is within normal limits  Bilaterally.  Neurologic  Senn-Weinstein monofilament wire test within normal limits  bilaterally. Muscle power within normal limits bilaterally.  Nails Thick disfigured discolored nails with subungual debris bilaterally from hallux to fifth toes bilaterally. No evidence of bacterial infection or drainage bilaterally.  Orthopedic  No limitations of motion of motion feet bilaterally.  No crepitus or effusions noted.  No bony pathology or digital deformities noted.  Skin  Dry scaly skin noted plantarly with fissures noted on callus both heels.         Assessment & Plan:  Onychomycosis  B/L  Diabetes with angiopathy.   IE  Debride nails  X 10.  Patient will benefit from diabetic footwear  and will be scheduled with [facility] for diabetic shoes due to diabetes and angiopathy.  Patient will make an appointment with [facility].  RTC 3 months for nail care. Patient also told to use vaseline on his diabetic feet.   Gardiner Barefoot DPM

## 2017-11-09 ENCOUNTER — Telehealth: Payer: Self-pay | Admitting: Neurology

## 2017-11-09 MED ORDER — DONEPEZIL HCL 10 MG PO TABS: 10.0000 mg | ORAL_TABLET | Freq: Every day | ORAL | 1 refills | Status: AC

## 2017-11-09 NOTE — Addendum Note (Signed)
Addended by: Gildardo Griffes on: 11/09/2017 09:23 AM   Modules accepted: Orders

## 2017-11-09 NOTE — Telephone Encounter (Signed)
Refilled Donepezil 10 mg tablet to cover patient through f/u appt. E-scribed to [facility] in [place].

## 2017-11-09 NOTE — Telephone Encounter (Signed)
Patient's wife requesting refill of donepezil (ARICEPT) 10 MG tablet called to [facility] in [place]. Patient has appointment with Dr. Jaynee Eagles on 12-15-17.

## 2017-11-10 ENCOUNTER — Other Ambulatory Visit: Payer: Medicare Other

## 2017-11-17 ENCOUNTER — Other Ambulatory Visit: Payer: Medicare Other

## 2017-12-15 ENCOUNTER — Ambulatory Visit: Payer: Medicare Other | Admitting: Neurology

## 2017-12-16 ENCOUNTER — Ambulatory Visit: Payer: Medicare Other | Admitting: Neurology

## 2018-01-05 ENCOUNTER — Ambulatory Visit: Payer: Medicare Other | Admitting: Orthotics

## 2018-01-05 ENCOUNTER — Other Ambulatory Visit: Payer: Self-pay | Admitting: Cardiovascular Disease

## 2018-01-05 DIAGNOSIS — I5032 Chronic diastolic (congestive) heart failure: Secondary | ICD-10-CM

## 2018-01-07 ENCOUNTER — Ambulatory Visit: Payer: Medicare Other | Admitting: Orthotics

## 2018-01-07 ENCOUNTER — Ambulatory Visit (INDEPENDENT_AMBULATORY_CARE_PROVIDER_SITE_OTHER): Payer: Self-pay | Admitting: Orthotics

## 2018-01-07 DIAGNOSIS — I739 Peripheral vascular disease, unspecified: Secondary | ICD-10-CM | POA: Diagnosis not present

## 2018-01-07 DIAGNOSIS — E1159 Type 2 diabetes mellitus with other circulatory complications: Secondary | ICD-10-CM

## 2018-01-07 NOTE — Progress Notes (Signed)

## 2018-02-02 ENCOUNTER — Ambulatory Visit: Payer: Medicare Other | Admitting: Podiatry

## 2018-02-02 ENCOUNTER — Encounter: Payer: Self-pay | Admitting: Podiatry

## 2018-02-02 DIAGNOSIS — B351 Tinea unguium: Secondary | ICD-10-CM

## 2018-02-02 DIAGNOSIS — M79674 Pain in right toe(s): Secondary | ICD-10-CM

## 2018-02-02 DIAGNOSIS — S98132S Complete traumatic amputation of one left lesser toe, sequela: Secondary | ICD-10-CM

## 2018-02-02 DIAGNOSIS — E1159 Type 2 diabetes mellitus with other circulatory complications: Secondary | ICD-10-CM

## 2018-02-02 DIAGNOSIS — M79675 Pain in left toe(s): Secondary | ICD-10-CM

## 2018-02-02 NOTE — Progress Notes (Signed)
Complaint:  Visit Type: Patient returns to my office for continued preventative foot care services. Complaint: Patient states" my nails have grown long and thick and become painful to walk and wear shoes" Patient has been diagnosed with DM with no foot complications. The patient presents for preventative foot care services. No changes to ROS.  Patient has second toe amputation left.  Patient is taking xarelto. Patient says he likes his diabetic shoes.  Podiatric Exam: Vascular: dorsalis pedis and posterior tibial pulses are  not palpable bilateral. Capillary return is immediate. Temperature gradient is WNL. Skin turgor WNL  Sensorium: Diminished  Semmes Weinstein monofilament test. Normal tactile sensation bilaterally. Nail Exam: Pt has thick disfigured discolored nails with subungual debris noted bilateral entire nail hallux through fifth toenails Ulcer Exam: There is no evidence of ulcer or pre-ulcerative changes or infection. Orthopedic Exam: Muscle tone and strength are WNL. No limitations in general ROM. No crepitus or effusions noted. Foot type and digits show no abnormalities. Bony prominences are unremarkable. Skin: No Porokeratosis. No infection or ulcers.  Dry scaly plantar skin.    Diagnosis:  Onychomycosis, , Pain in right toe, pain in left toes  Treatment & Plan Procedures and Treatment: Consent by patient was obtained for treatment procedures.   Debridement of mycotic and hypertrophic toenails, 1 through 5 bilateral except second toe left foot. and clearing of subungual debris. No ulceration, no infection noted.  Return Visit-Office Procedure: Patient instructed to return to the office for a follow up visit 3 months for continued evaluation and treatment.    Gardiner Barefoot DPM

## 2018-05-04 ENCOUNTER — Ambulatory Visit: Payer: Medicare Other | Admitting: Podiatry

## 2018-06-02 ENCOUNTER — Telehealth: Payer: Self-pay | Admitting: Internal Medicine

## 2018-06-02 NOTE — Telephone Encounter (Signed)
Darrell Mcclain from pts pcp Dr.Aronson's office calling linda back to see if they could get pt in sooner. [name] states pt had a BM in his pants at the office today, without any warning and had to go home in a trash bag. [name] states okay to call their office or the patient.

## 2018-06-02 NOTE — Telephone Encounter (Signed)
Pt had an episode where he had uncontrollable diarrhea, did not know he had done this. Dr. Jacquiline Doe office requesting he be seen asap. Pt was in tears and is afraid to leave the house. They are concerned about a recurrence of cancer. Pt scheduled to see Nicoletta Ba PA tomorrow at 8:30am. Dr. Jacquiline Doe office to notify pt of appt.

## 2018-06-03 ENCOUNTER — Encounter: Payer: Self-pay | Admitting: Physician Assistant

## 2018-06-03 ENCOUNTER — Encounter (INDEPENDENT_AMBULATORY_CARE_PROVIDER_SITE_OTHER): Payer: Self-pay

## 2018-06-03 ENCOUNTER — Ambulatory Visit: Payer: Medicare Other | Admitting: Physician Assistant

## 2018-06-03 VITALS — BP 124/66 | HR 66 | Ht 68.0 in | Wt 240.0 lb

## 2018-06-03 DIAGNOSIS — R197 Diarrhea, unspecified: Secondary | ICD-10-CM

## 2018-06-03 DIAGNOSIS — R159 Full incontinence of feces: Secondary | ICD-10-CM | POA: Diagnosis not present

## 2018-06-03 DIAGNOSIS — Z86010 Personal history of colonic polyps: Secondary | ICD-10-CM

## 2018-06-03 DIAGNOSIS — Z860101 Personal history of adenomatous and serrated colon polyps: Secondary | ICD-10-CM

## 2018-06-03 DIAGNOSIS — R195 Other fecal abnormalities: Secondary | ICD-10-CM

## 2018-06-03 DIAGNOSIS — R152 Fecal urgency: Secondary | ICD-10-CM

## 2018-06-03 MED ORDER — HYOSCYAMINE SULFATE SL 0.125 MG SL SUBL: SUBLINGUAL_TABLET | SUBLINGUAL | 4 refills | Status: DC

## 2018-06-03 NOTE — Progress Notes (Signed)
Subjective:    Patient ID: Darrell Mcclain, male    DOB: 1941-08-29, 77 y.o.   MRN: [id number]  HPI Helen is a 77 year old white male, referred by Dr. Joya Salm for episodic diarrhea and incontinence.  He is known to Dr. Henrene Pastor. Patient has history of atrial flutter, congestive heart failure with EF of 55%, he is maintained on Xarelto, insulin-dependent diabetes mellitus, history of prostate cancer obesity and memory loss. He had undergone colonoscopy in September 2012 with finding of a redundant colon.  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 He was seen back by Dr. Henrene Pastor in the office in 2017 guarding follow-up colonoscopy which is initially was recommended in 3 years.  At that time patient had no complaints and decision was made not to pursue follow-up colonoscopy due to his comorbidities.  Patient says over the past 2 to 3 years he has been having frequent episodes of diarrhea for which he takes Imodium which then causes constipation.  He may go 3 days without a bowel movement usually does not take any laxatives and then will develop fecal urgency and at times incontinent diarrhea.  He says these episodes are infrequent and may have occurred once or twice per year over the past couple of years.  He had a recent episode while he was on his way into the dentist office with complete incontinence.  He says he gets no warning. He feels that salads and raw vegetables trigger diarrhea for him.  He also uses artificial sweeteners on a regular basis.  He has no current complaints of abdominal pain or cramping appetite is been good weight has been stable.  He has not had any recent cardiac issues, no oxygen use.  Review of Systems Pertinent positive and negative review of systems were noted in the above HPI section.   All other review of systems was otherwise negative.  Outpatient Encounter Medications as of 06/03/2018  Medication Sig  . acetaminophen (TYLENOL) 500 MG tablet Take 1,000 mg by mouth every 6 (six) hours as needed for mild pain, moderate pain, fever or headache.  Marland Kitchen atorvastatin (LIPITOR) 20 MG tablet Take 1 tablet (20 mg total) by mouth daily. (Patient taking differently: Take 20 mg by mouth every evening. )  . donepezil (ARICEPT) 10 MG tablet Take 1 tablet (10 mg total) by mouth at bedtime.  . dorzolamide (TRUSOPT) 2 % ophthalmic solution Place 1 drop into both eyes 2 (two) times daily.   . finasteride (PROSCAR) 5 MG tablet Take 5 mg by mouth every evening.   . furosemide (LASIX) 40 MG tablet Take two (2) tablets (80 mg total) by mouth each morning and take one (1) tablet (40 mg total) by mouth each afternoon. Please make appt.  . Hyoscyamine Sulfate SL (LEVSIN/SL) 0.125 MG SUBL Dissolve 1 tablet on the tongue at the first sign of diarrhea episodes, you may use 1 tablet every 6 hours as needed.  . insulin aspart (NOVOLOG) 100 UNIT/ML injection Inject 6 Units into the skin 3 (three) times daily before meals.   . insulin glargine (LANTUS) 100 UNIT/ML injection Inject 40 Units into the skin at bedtime.   Marland Kitchen lisinopril (PRINIVIL,ZESTRIL) 20 MG tablet Take 10 mg by mouth daily.   . metFORMIN (GLUCOPHAGE) 1000 MG tablet Take 1,000 mg by mouth 2 (  two) times daily with a meal.  . metoprolol succinate (TOPROL-XL) 50 MG 24 hr tablet Take 1 tablet (50 mg total) by mouth 2 (two) times daily. Take with or immediately following a meal. (Patient taking differently: Take 25 mg by mouth daily. Take with or immediately following a meal.)  . omeprazole (PRILOSEC) 20 MG capsule Take 20 mg by mouth every morning.   . polyethylene glycol (MIRALAX / GLYCOLAX) packet Take 17 g by mouth daily.  Marland Kitchen PRESCRIPTION MEDICATION every 6 (six) weeks. Injections in eyes at dr's office  . rivaroxaban (XARELTO) 20 MG TABS tablet Take 1  tablet (20 mg total) by mouth daily with supper.  . tamsulosin (FLOMAX) 0.4 MG CAPS capsule Take 0.4 mg by mouth daily with breakfast.    No facility-administered encounter medications on file as of 06/03/2018.    Allergies  Allergen Reactions  . Ciprofloxacin Diarrhea  . Oxycodone Other (See Comments)    "confusion and loopy"   Patient Active Problem List   Diagnosis Date Noted  . Malignant neoplasm of prostate ([place]) 09/10/2016  . Falls 11/21/2015  . Memory loss 11/21/2015  . Uncontrolled diabetes mellitus ([place]) 11/21/2015  . Morbid obesity ([place]) 11/21/2015  . Hyperlipidemia 11/21/2015  . Essential hypertension 11/21/2015  . 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 Chronic diastolic CHF (congestive heart failure), NYHA class 2 ([place]) 02/28/2014  . Atrial flutter ([place]) 10/13/2013   Social History   Socioeconomic History  . Marital status: Divorced    Spouse name: Not on file  . Number of children: Not on file  . Years of education: Not on file  . Highest education level: Not on file  Occupational History  . Not on file  Social Needs  . Financial resource strain: Not on file  . Food insecurity:    Worry: Not on file    Inability: Not on file  . Transportation needs:    Medical: Not on file    Non-medical: Not on file  Tobacco Use  . Smoking status: Former Smoker    Packs/day: 2.00    Years: 20.00    Pack years: 40.00    Last attempt to quit: 05/09/1983    Years since quitting: 35.0  . Smokeless tobacco: Never Used  Substance and Sexual Activity  . Alcohol use: No  . Drug use: No  . Sexual activity: Not Currently  Lifestyle  . Physical activity:    Days per week: Not on file    Minutes per session: Not on file  . Stress: Not on file  Relationships  . Social connections:    Talks on phone: Not on file    Gets together: Not on file    Attends religious service: Not on file    Active member of club or organization: Not on file    Attends meetings of  clubs or organizations: Not on file    Relationship status: Not on file  . Intimate partner violence:    Fear of current or ex partner: Not on file    Emotionally abused: Not on file    Physically abused: Not on file    Forced sexual activity: Not on file  Other Topics Concern  . Not on file  Social History Narrative   Lives at [place] through rehab after falling and breaking right shoulder. Phone: [phone] Fax: [phone]       Mr. Brunkhorst family history includes Cancer in his sister.      Objective:  Vitals:   06/03/18 0824  BP: 124/66  Pulse: 66    Physical Exam; well-developed elderly white male in no acute distress, somewhat cantankerous, accompanied by his ex-wife Patient ambulates with a walker but is able to get up on the exam table without any difficulty. Blood pressure 124/66 pulse 66, height 5 foot 8, weight 240, BMI 36.4.  HEENT; nontraumatic normocephalic EOMI PERRLA sclera anicteric oropharynx moist.  Cardiovascular; regular rate and rhythm with S1-S2.  Pulmonary ;clear bilaterally, Abdomen ;obese soft protuberant nontender no palpable mass or hepatosplenomegaly sounds present, Rectal ;exam patient has decreased anal sphincter tone and decreased squeeze, there is formed stool in the rectal vault which is brown and heme positive.  Extremities ;no clubbing cyanosis or edema skin warm dry, Neuro psych ;alert and oriented, mood and affect appropriate mostly nonfocal       Assessment & Plan:   #60 77 year old male with 2-year history of change in bowel habits with frequent diarrhea, followed by constipation after 1 dose of Imodium, then eventually develops urgency and incontinence, or urgency and diarrhea. Stool heme positive today. Personal history of large adenomatous polyps at colonoscopy 2012  Etiology of change in bowel habits, diarrhea urgency and very sporadic incontinence is not clear. Rule out occult colon lesion, rule out  diabetic neuropathy, rule out medication induced though has been on his current meds without change long-term.  He is on metformin at thousand milligrams twice daily.  #2 atrial flutter-on Xarelto #3.  Congestive heart failure EF 55% #4.  Hypertension #5.  Adult onset diabetes mellitus, insulin #6  Prostate CA #7  Morbid obesity  Plan; Patient will start Benefiber 1 scoop daily in a glass of water [place]line Have given her a prescription for Levsin sublingual and asked him to take 1 dissolved on tongue at first sign of any diarrhea repeat 6 hours later as needed.  This is to be used in place of Imodium hopefully will be less constipating Hopefully will also help overt episodes of incontinence Avoid salads and raw vegetables, stop artificial sweeteners  She will be scheduled for colonoscopy with Dr. Henrene Pastor.  Procedure was discussed in detail with the patient and his ex-wife and they are in agreement meant to proceed.  Will need to hold Xarelto for 24 to 48 hours prior to procedure and we will communicate with his PCP Dr. [name] to assure this is acceptable for this patient.  Also requested copies of his most recent labs.  Amy Genia Harold PA-C 06/03/2018   Cc: Burnard Bunting, MD

## 2018-06-03 NOTE — Patient Instructions (Addendum)
Take Benefiber 1 scoop daily in a glass of water.  Stop Imodium.  We sent a prescription to [facility].  1. Levsin Sl.  Try Stevia instead of Sweet & Low.   You have been scheduled for a colonoscopy. Please follow written instructions given to you at your visit today.  Please pick up your prep supplies at the pharmacy within the next 1-3 days. If you use inhalers (even only as needed), please bring them with you on the day of your procedure.

## 2018-06-04 NOTE — Progress Notes (Signed)
Assessment and plan noted ?

## 2018-06-08 ENCOUNTER — Encounter

## 2018-06-08 ENCOUNTER — Ambulatory Visit: Payer: Medicare Other | Admitting: Internal Medicine

## 2018-06-09 ENCOUNTER — Telehealth: Payer: Self-pay | Admitting: *Deleted

## 2018-06-09 NOTE — Telephone Encounter (Signed)
Informed [name], the patient's wife that Dr. [name] said he can hold the Xarelto on 06-28-18 and resume after the procedure.  She thanked me for calling and verbalized understanding the instructions.

## 2018-06-10 ENCOUNTER — Other Ambulatory Visit: Payer: Self-pay | Admitting: *Deleted

## 2018-06-10 DIAGNOSIS — R195 Other fecal abnormalities: Secondary | ICD-10-CM

## 2018-06-10 DIAGNOSIS — R194 Change in bowel habit: Secondary | ICD-10-CM

## 2018-06-10 DIAGNOSIS — R197 Diarrhea, unspecified: Secondary | ICD-10-CM

## 2018-06-10 DIAGNOSIS — R159 Full incontinence of feces: Secondary | ICD-10-CM

## 2018-06-10 DIAGNOSIS — Z86010 Personal history of colonic polyps: Secondary | ICD-10-CM

## 2018-06-15 ENCOUNTER — Encounter: Payer: Self-pay | Admitting: Internal Medicine

## 2018-06-28 ENCOUNTER — Ambulatory Visit: Payer: Medicare Other | Admitting: Neurology

## 2018-06-29 ENCOUNTER — Encounter: Payer: Self-pay | Admitting: Internal Medicine

## 2018-06-29 ENCOUNTER — Ambulatory Visit (AMBULATORY_SURGERY_CENTER): Payer: Medicare Other | Admitting: Internal Medicine

## 2018-06-29 VITALS — BP 152/72 | HR 60 | Temp 98.6°F | Resp 16 | Ht 68.0 in | Wt 240.0 lb

## 2018-06-29 DIAGNOSIS — D122 Benign neoplasm of ascending colon: Secondary | ICD-10-CM

## 2018-06-29 DIAGNOSIS — D128 Benign neoplasm of rectum: Secondary | ICD-10-CM

## 2018-06-29 DIAGNOSIS — R195 Other fecal abnormalities: Secondary | ICD-10-CM

## 2018-06-29 DIAGNOSIS — D124 Benign neoplasm of descending colon: Secondary | ICD-10-CM

## 2018-06-29 DIAGNOSIS — E162 Hypoglycemia, unspecified: Secondary | ICD-10-CM

## 2018-06-29 DIAGNOSIS — K621 Rectal polyp: Secondary | ICD-10-CM

## 2018-06-29 DIAGNOSIS — R197 Diarrhea, unspecified: Secondary | ICD-10-CM | POA: Diagnosis not present

## 2018-06-29 DIAGNOSIS — Z86010 Personal history of colonic polyps: Secondary | ICD-10-CM

## 2018-06-29 DIAGNOSIS — D125 Benign neoplasm of sigmoid colon: Secondary | ICD-10-CM | POA: Diagnosis not present

## 2018-06-29 DIAGNOSIS — D123 Benign neoplasm of transverse colon: Secondary | ICD-10-CM

## 2018-06-29 MED ORDER — SODIUM CHLORIDE 0.9 % IV SOLN: 500.0000 mL | Freq: Once | INTRAVENOUS | Status: AC

## 2018-06-29 MED ORDER — DEXTROSE 50 % IV SOLN
25.0000 mL | Freq: Once | INTRAVENOUS | Status: AC
  Administered 2018-06-29: 25 mL via INTRAVENOUS

## 2018-06-29 NOTE — Patient Instructions (Signed)
YOU HAD AN ENDOSCOPIC PROCEDURE TODAY AT [facility]:   Refer to the procedure report that was given to you for any specific questions about what was found during the examination.  If the procedure report does not answer your questions, please call your gastroenterologist to clarify.  If you requested that your care partner not be given the details of your procedure findings, then the procedure report has been included in a sealed envelope for you to review at your convenience later.  YOU SHOULD EXPECT: Some feelings of bloating in the abdomen. Passage of more gas than usual.  Walking can help get rid of the air that was put into your GI tract during the procedure and reduce the bloating. If you had a lower endoscopy (such as a colonoscopy or flexible sigmoidoscopy) you may notice spotting of blood in your stool or on the toilet paper. If you underwent a bowel prep for your procedure, you may not have a normal bowel movement for a few days.  Please Note:  You might notice some irritation and congestion in your nose or some drainage.  This is from the oxygen used during your procedure.  There is no need for concern and it should clear up in a day or so.  SYMPTOMS TO REPORT IMMEDIATELY:   Following lower endoscopy (colonoscopy or flexible sigmoidoscopy):  Excessive amounts of blood in the stool  Significant tenderness or worsening of abdominal pains  Swelling of the abdomen that is new, acute  Fever of 100F or higher  For urgent or emergent issues, a gastroenterologist can be reached at any hour by calling [phone].   DIET:  We do recommend a small meal at first, but then you may proceed to your regular diet.  Drink plenty of fluids but you should avoid alcoholic beverages for 24 hours.   MEDICATIONS: Continue present medications. Resume Xarelto (rivaroxaban) today at prior dose. Recommend Metamucil 2 tablespoons daily in 12 to 14 ounces of water or juice to improve bowel  consistency.  ACTIVITY:  You should plan to take it easy for the rest of today and you should NOT DRIVE or use heavy machinery until tomorrow (because of the sedation medicines used during the test).    FOLLOW UP: Our staff will call the number listed on your records the next business day following your procedure to check on you and address any questions or concerns that you may have regarding the information given to you following your procedure. If we do not reach you, we will leave a message.  However, if you are feeling well and you are not experiencing any problems, there is no need to return our call.  We will assume that you have returned to your regular daily activities without incident.  If any biopsies were taken you will be contacted by phone or by letter within the next 1-3 weeks.  Please call us at [phone] if you have not heard about the biopsies in 3 weeks.    SIGNATURES/CONFIDENTIALITY: You and/or your care partner have signed paperwork which will be entered into your electronic medical record.  These signatures attest to the fact that that the information above on your After Visit Summary has been reviewed and is understood.  Full responsibility of the confidentiality of this discharge information lies with you and/or your care-partner.

## 2018-06-29 NOTE — Progress Notes (Signed)
Patient arrives to recovery post-colonoscopy. VSS, patient A&O X3, without complaint, BS 49, recheck is 39, MD notified, order taken to give orange juice po, 4 oz. Orange juice given as ordered, recheck BS 15 later is 41, MD notified, order taken and adm D50 25g IVP, BS recheck 15 later shows BS 147. MD aware. Patient without complaint, VSS, caregivers at bedside, instructed on medications and to monitor BS closely this evening, criteria met for discharge.

## 2018-06-29 NOTE — Progress Notes (Signed)
Pt's states no medical or surgical changes since previsit or office visit. 

## 2018-06-29 NOTE — Op Note (Signed)
[place] [place] Patient Name: Darrell Mcclain Procedure Date: 06/29/2018 4:54 PM MRN: [id number] Endoscopist: Docia Chuck. Henrene Pastor , MD Age: 77 Referring MD:  Date of Birth: 02-22-1941 Gender: Male Account #: [id number] Procedure:                Colonoscopy with cold snare polypectomy x 7; with                            biopsies Indications:              Heme positive stool, Change in bowel habits,                            Diarrhea. Previous examination September 2012 with                            tubulovillous adenoma and tubular adenoma Medicines:                Monitored Anesthesia Care Procedure:                Pre-Anesthesia Assessment:                           - Prior to the procedure, a History and Physical                            was performed, and patient medications and                            allergies were reviewed. The patient's tolerance of                            previous anesthesia was also reviewed. The risks                            and benefits of the procedure and the sedation                            options and risks were discussed with the patient.                            All questions were answered, and informed consent                            was obtained. Prior Anticoagulants: The patient has                            taken Xarelto (rivaroxaban), last dose was 2 days                            prior to procedure. ASA Grade Assessment: III - A                            patient with severe systemic disease. After  reviewing the risks and benefits, the patient was                            deemed in satisfactory condition to undergo the                            procedure.                           After obtaining informed consent, the colonoscope                            was passed under direct vision. Throughout the                            procedure, the patient's blood pressure, pulse, and                   oxygen saturations were monitored continuously. The                            Colonoscope was introduced through the anus and                            advanced to the the cecum, identified by                            appendiceal orifice and ileocecal valve. The                            ileocecal valve, appendiceal orifice, and rectum                            were photographed. The quality of the bowel                            preparation was good. The colonoscopy was performed                            without difficulty. The patient tolerated the                            procedure well. The bowel preparation used was                            SUPREP. Scope In: 5:05:37 PM Scope Out: 5:27:22 PM Scope Withdrawal Time: 0 hours 18 minutes 34 seconds  Total Procedure Duration: 0 hours 21 minutes 45 seconds  Findings:                 Seven polyps were found in the rectum, sigmoid                            colon, transverse colon and ascending colon. The  polyps were 2 to 5 mm in size. These polyps were                            removed with a cold snare. Resection and retrieval                            were complete.                           Multiple diverticula were found in the left colon                            and right colon.                           Internal hemorrhoids were found during retroflexion.                           The entire examined colon appeared normal on direct                            and retroflexion views. Biopsies for histology were                            taken with a cold forceps from the entire colon for                            evaluation of microscopic colitis. Complications:            No immediate complications. Estimated blood loss:                            None. Estimated Blood Loss:     Estimated blood loss: none. Impression:               - Seven 2 to 5 mm polyps in the rectum, in  the                            sigmoid colon, in the transverse colon and in the                            ascending colon, removed with a cold snare.                            Resected and retrieved.                           - Diverticulosis in the left colon and in the right                            colon.                           - Internal hemorrhoids.                           -  The entire examined colon is normal on direct and                            retroflexion views. Recommendation:           - Repeat colonoscopy is not recommended for                            surveillance.                           - Resume Xarelto (rivaroxaban) today at prior dose.                           - Patient has a contact number available for                            emergencies. The signs and symptoms of potential                            delayed complications were discussed with the                            patient. Return to normal activities tomorrow.                            Written discharge instructions were provided to the                            patient.                           - Resume previous diet.                           - Continue present medications.                           - Await pathology results.                           - Recommend Metamucil 2 tablespoons daily in 12 to                            14 ounces of water or juice to improve bowel                            consistency Cristiano Capri N. Henrene Pastor, MD 06/29/2018 5:36:15 PM This report has been signed electronically.

## 2018-06-29 NOTE — Progress Notes (Signed)
Called to room to assist during endoscopic procedure.  Patient ID and intended procedure confirmed with present staff. Received instructions for my participation in the procedure from the performing physician.  

## 2018-06-29 NOTE — Progress Notes (Signed)
A/ox3 pleased with MAC, report to RN 

## 2018-06-30 ENCOUNTER — Telehealth: Payer: Self-pay | Admitting: *Deleted

## 2018-06-30 NOTE — Telephone Encounter (Signed)
  Follow up Call-  Call back number 06/29/2018  Post procedure Call Back phone  # [phone]  Permission to leave phone message No  Some recent data might be hidden     Patient questions:  Do you have a fever, pain , or abdominal swelling? No. Pain Score  0 *  Have you tolerated food without any problems? Yes.    Have you been able to return to your normal activities? Yes.    Do you have any questions about your discharge instructions: Diet   No. Medications  No. Follow up visit  No.  Do you have questions or concerns about your Care? No.  Actions: * If pain score is 4 or above: No action needed, pain <4.

## 2018-07-06 ENCOUNTER — Encounter: Payer: Self-pay | Admitting: Internal Medicine

## 2018-07-28 ENCOUNTER — Ambulatory Visit: Payer: Medicare Other | Admitting: Internal Medicine

## 2018-08-18 ENCOUNTER — Ambulatory Visit: Payer: Medicare Other | Admitting: Internal Medicine

## 2018-08-24 ENCOUNTER — Ambulatory Visit: Payer: Medicare Other | Admitting: Neurology

## 2018-10-14 ENCOUNTER — Other Ambulatory Visit: Payer: Self-pay | Admitting: Internal Medicine

## 2018-10-14 DIAGNOSIS — R1013 Epigastric pain: Secondary | ICD-10-CM

## 2018-10-20 ENCOUNTER — Ambulatory Visit
Admission: RE | Admit: 2018-10-20 | Discharge: 2018-10-20 | Disposition: A | Discharge status: Home / Self Care | Payer: Medicare Other | Source: Ambulatory Visit | Attending: Internal Medicine | Admitting: Internal Medicine

## 2018-10-20 DIAGNOSIS — R1013 Epigastric pain: Secondary | ICD-10-CM

## 2018-10-27 ENCOUNTER — Other Ambulatory Visit: Payer: Self-pay | Admitting: Internal Medicine

## 2018-10-27 DIAGNOSIS — J90 Pleural effusion, not elsewhere classified: Secondary | ICD-10-CM

## 2018-11-06 ENCOUNTER — Other Ambulatory Visit: Payer: Self-pay

## 2018-11-06 ENCOUNTER — Emergency Department
Admission: EM | Admit: 2018-11-06 | Discharge: 2018-11-06 | Disposition: A | Discharge status: Home / Self Care | Payer: Medicare Other | Source: Home / Self Care | Attending: Family Medicine | Admitting: Family Medicine

## 2018-11-06 DIAGNOSIS — E11649 Type 2 diabetes mellitus with hypoglycemia without coma: Secondary | ICD-10-CM

## 2018-11-06 LAB — POCT CBC W AUTO DIFF (K'VILLE URGENT CARE)

## 2018-11-06 LAB — POCT FASTING CBG KUC MANUAL ENTRY: POCT Glucose (KUC): 162 mg/dL — AB (ref 70–99)

## 2018-11-06 NOTE — ED Provider Notes (Signed)
[name]    CSN: [id number] Arrival date & time: 11/06/18  1425     History   Chief Complaint No chief complaint on file.   HPI Darrell Mcclain is a 78 y.o. male.   Patient complains of difficulty awakening each morning for the past 3 to 4 days.  He denies light-headedness or dizziness, and feels well otherwise.  He admits that he has gradually lost weight from about 255 pounds to 215 pounds.  He is diabetic, and continues taking Lantus 40mg  each evening, and Novolog before meals on a sliding scale.  The history is provided by the patient and a caregiver.    Past Medical History:  Diagnosis Date  . Anticoagulant long-term use    xarelto  . Arthritis    right hand  . At risk for sleep apnea    STOP-BANG==  6      PT ALREADY SET UP TO HAVE STUDY AT [facility] , PCP AWARE  . Atrial flutter, chronic [facility]) dx 01/ 2015   cardiologist-  dr Angelena Form  . Bilateral lower extremity edema   . BPH (benign prostatic hyperplasia)   . Cerebral microvascular disease    advanced and multiple foci throughout hemispheres and brain stem-- residual memory issues  . Chronic constipation   . Chronic kidney disease (CKD), stage III (moderate) ([place])    nephrologist-  dr Marval Regal [facility])  . Diastolic CHF, chronic ([place])   . Exertional dyspnea   . Foley catheter in place   . GERD (gastroesophageal reflux disease)   . Glaucoma, both eyes   . History of adenomatous polyp of colon    2012  . History of CVA (cerebrovascular accident)    (pt had no symptoms other than memory issues) per MRI 11-29-2015  very small infarct right frontal lobe and multiple foci throughout hemispheres and brain stem  . 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 Hyperlipidemia   . Hypertension   . Iron deficiency anemia   . Memory loss   . Prostate cancer ([place])   . RBBB (right bundle branch block with left anterior fascicular block)   . Rosacea   . Type 2 diabetes  mellitus ([place])   . Urine retention   . Vitamin B 12 deficiency     Patient Active Problem List   Diagnosis Date Noted  . Malignant neoplasm of prostate ([place]) 09/10/2016  . Falls 11/21/2015  . Memory loss 11/21/2015  . Uncontrolled diabetes mellitus ([place]) 11/21/2015  . Morbid obesity ([place]) 11/21/2015  . Hyperlipidemia 11/21/2015  . Essential hypertension 11/21/2015  . 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 Chronic diastolic CHF (congestive heart failure), NYHA class 2 ([place]) 02/28/2014  . Atrial flutter ([place]) 10/13/2013    Past Surgical History:  Procedure Laterality Date  . CATARACT EXTRACTION W/ INTRAOCULAR LENS  IMPLANT, BILATERAL  2016  . ORIF PROXIMAL HUMEROUS FX AND BICEPS TENODESIS  11/09/2015  . OTHER SURGICAL HISTORY  1996   reconstruction left toe (amputation repair)  . PROSTATE BIOPSY N/A 07/22/2016   Procedure: BIOPSY TRANSRECTAL ULTRASONIC PROSTATE (TUBP);  Surgeon: Festus Aloe, MD;  Location: [facility];  Service: Urology;  Laterality: N/A;  . TONSILLECTOMY  1951  . TRANSTHORACIC ECHOCARDIOGRAM  08/23/2015   mild LVH, ef 55-60%, grade 1 diastolitc dysfunction  . [place] Medications    Prior to Admission medications   Medication  Sig Start Date End Date Taking? Authorizing Provider  acetaminophen (TYLENOL) 500 MG tablet Take 1,000 mg by mouth every 6 (six) hours as needed for mild pain, moderate pain, fever or headache.    [provider]  amLODipine (NORVASC) 5 MG tablet Take by mouth.    [provider]  atorvastatin (LIPITOR) 20 MG tablet Take 1 tablet (20 mg total) by mouth daily. Patient taking differently: Take 20 mg by mouth every evening.  09/22/13   Burnell Blanks, MD  donepezil (ARICEPT) 10 MG tablet Take 1 tablet (10 mg total) by mouth at bedtime. 11/09/17   Melvenia Beam, MD  dorzolamide (TRUSOPT) 2 % ophthalmic solution Place 1 drop into both eyes 2 (two) times  daily.  06/20/15   [provider]  finasteride (PROSCAR) 5 MG tablet Take 5 mg by mouth every evening.  05/16/16   [provider]  furosemide (LASIX) 40 MG tablet Take two (2) tablets (80 mg total) by mouth each morning and take one (1) tablet (40 mg total) by mouth each afternoon. Please make appt. 01/07/18   Burnell Blanks, MD  Hyoscyamine Sulfate SL (LEVSIN/SL) 0.125 MG SUBL Dissolve 1 tablet on the tongue at the first sign of diarrhea episodes, you may use 1 tablet every 6 hours as needed. 06/03/18   Esterwood, Amy S, PA-C  insulin aspart (NOVOLOG) 100 UNIT/ML injection Inject 6 Units into the skin 3 (three) times daily before meals.     [provider]  insulin glargine (LANTUS) 100 UNIT/ML injection Inject 40 Units into the skin at bedtime.     [provider]  lisinopril (PRINIVIL,ZESTRIL) 20 MG tablet Take 10 mg by mouth daily.     [provider]  metFORMIN (GLUCOPHAGE) 1000 MG tablet Take 1,000 mg by mouth 2 (two) times daily with a meal.    [provider]  metoprolol succinate (TOPROL-XL) 50 MG 24 hr tablet Take 1 tablet (50 mg total) by mouth 2 (two) times daily. Take with or immediately following a meal. Patient taking differently: Take 25 mg by mouth daily. Take with or immediately following a meal. 04/04/15   Burnell Blanks, MD  omeprazole (PRILOSEC) 20 MG capsule Take 20 mg by mouth every morning.  04/26/11   [provider]  polyethylene glycol (MIRALAX / GLYCOLAX) packet Take 17 g by mouth daily.    [provider]  potassium chloride (MICRO-K) 10 MEQ CR capsule TK 1 C PO D 09/09/18   [provider]  PRESCRIPTION MEDICATION every 6 (six) weeks. Injections in eyes at dr's office    [provider]  rivaroxaban (XARELTO) 20 MG TABS tablet Take 1 tablet (20 mg total) by mouth daily with supper. 07/23/16   Festus Aloe, MD  tamsulosin (FLOMAX) 0.4 MG CAPS capsule Take 0.4 mg by  mouth daily with breakfast.  01/09/14   [provider]    Family History Family History  Problem Relation Age of Onset  . Cancer Sister        lung to brain/smoker  . Colon cancer Neg Hx   . Rectal cancer Neg Hx   . Stomach cancer Neg Hx   . CAD Neg Hx   . Dementia Neg Hx     Social History Social History   Tobacco Use  . Smoking status: Former Smoker    Packs/day: 2.00    Years: 20.00    Pack years: 40.00    Last attempt to quit: 05/09/1983  Years since quitting: 35.5  . Smokeless tobacco: Never Used  Substance Use Topics  . Alcohol use: No  . Drug use: No     Allergies   Ciprofloxacin and Oxycodone   Review of Systems Review of Systems  Constitutional: Negative for activity change, appetite change, chills, diaphoresis, fatigue, fever and unexpected weight change.  HENT: Negative.   Eyes: Negative.   Respiratory: Negative for cough, chest tightness, shortness of breath and wheezing.   Cardiovascular: Negative for chest pain, palpitations and leg swelling.  Gastrointestinal: Negative.   Genitourinary: Negative.   Musculoskeletal: Negative.   Skin: Negative.   Neurological: Negative for dizziness, tremors, seizures, syncope, speech difficulty, weakness, light-headedness, numbness and headaches.     Physical Exam Triage Vital Signs ED Triage Vitals [11/06/18 1502]  Enc Vitals Group     BP 128/62     Pulse Rate 68     Resp      Temp      Temp src      SpO2 91 %     Weight      Height 5\' 8"  (1.727 m)     Head Circumference      Peak Flow      Pain Score 0     Pain Loc      Pain Edu?      Excl. in [place]?    No data found.  Updated Vital Signs BP 128/62 (BP Location: Left Arm)   Pulse 68   Ht 5\' 8"  (1.727 m)   SpO2 91%   BMI 36.49 kg/m   Visual Acuity Right Eye Distance:   Left Eye Distance:   Bilateral Distance:    Right Eye Near:   Left Eye Near:    Bilateral Near:     Physical Exam Nursing notes and Vital Signs  reviewed. Appearance:  Patient appears stated age, and in no acute distress.  He is alert and oriented.  Eyes:  Pupils are equal, round, and reactive to light and accomodation.  Extraocular movement is intact.  Conjunctivae are not inflamed  Ears:  Canals normal.  Tympanic membranes normal.  Nose:   Normal turbinates.  No sinus tenderness.   Pharynx:  Normal Neck:  Supple.  No adenopathy or thyromegaly.   Lungs:  Clear to auscultation.  Breath sounds are equal.  Moving air well. Heart:  Regular rate and rhythm without murmurs, rubs, or gallops.  Abdomen:  Nontender without masses or hepatosplenomegaly.  Bowel sounds are present.  No CVA or flank tenderness.  Extremities:  No edema.  Skin:  No rash present.    UC Treatments / Results  Labs (all labs ordered are listed, but only abnormal results are displayed) Labs Reviewed  POCT CBC W AUTO DIFF ([facility]):  WBC 9.8; LY 12.7; MO 4.6; GR 82.7; Hgb 13.1; Platelets 200   POCT FASTING CBG KUC MANUAL ENTRY 162    EKG None  Radiology No results found.  Procedures Procedures (including critical care time)  Medications Ordered in UC Medications - No data to display  Initial Impression / Assessment and Plan / UC Course  I have reviewed the triage vital signs and the nursing notes.  Pertinent labs & imaging results that were available during my care of the patient were reviewed by me and considered in my medical decision making (see chart for details).    Suspect that patient is having episodes of hypoglycemia each morning as a result of weight loss. Followup with Family Doctor  in about 6 days.   Final Clinical Impressions(s) / UC Diagnoses   Final diagnoses:  Hypoglycemic episode in patient with diabetes mellitus [facility])     Discharge Instructions     Recommend that you try decreasing your daily dose of Lantus to 30 units, without changing other medications. Monitor your glucose more frequently at different times of  day and record on a calendar.  Keep a glass of fruit juice by your bedside in case your glucose drops during the night or morning.  If symptoms become significantly worse during the night or over the weekend, proceed to the local emergency room.     ED Prescriptions    None        Kandra Nicolas, MD 11/18/18 1214

## 2018-11-06 NOTE — ED Triage Notes (Signed)
Pt c/o difficulty waking in the morning. Tries to wake up but "starts dreaming of swimming [place]" and can't wake up. Here with caretaker today who says she called him before she came over and he said she better call an ambulance.

## 2018-11-06 NOTE — Discharge Instructions (Addendum)
Recommend that you try decreasing your daily dose of Lantus to 30 units, without changing other medications. Monitor your glucose more frequently at different times of day and record on a calendar.  Keep a glass of fruit juice by your bedside in case your glucose drops during the night or morning.  If symptoms become significantly worse during the night or over the weekend, proceed to the local emergency room.

## 2018-11-16 ENCOUNTER — Ambulatory Visit
Admission: RE | Admit: 2018-11-16 | Discharge: 2018-11-16 | Disposition: A | Discharge status: Home / Self Care | Payer: Medicare Other | Source: Ambulatory Visit | Attending: Internal Medicine | Admitting: Internal Medicine

## 2018-11-16 DIAGNOSIS — J90 Pleural effusion, not elsewhere classified: Secondary | ICD-10-CM

## 2018-11-17 ENCOUNTER — Ambulatory Visit: Payer: Medicare Other | Admitting: Neurology

## 2018-11-17 ENCOUNTER — Encounter

## 2018-11-18 ENCOUNTER — Other Ambulatory Visit: Payer: Self-pay | Admitting: Internal Medicine

## 2018-11-18 ENCOUNTER — Other Ambulatory Visit (HOSPITAL_COMMUNITY): Payer: Self-pay | Admitting: Internal Medicine

## 2018-11-18 DIAGNOSIS — J90 Pleural effusion, not elsewhere classified: Secondary | ICD-10-CM

## 2018-11-26 ENCOUNTER — Ambulatory Visit (HOSPITAL_COMMUNITY): Payer: Medicare Other

## 2018-12-01 ENCOUNTER — Ambulatory Visit (HOSPITAL_COMMUNITY)
Admission: RE | Admit: 2018-12-01 | Discharge: 2018-12-01 | Disposition: A | Discharge status: Home / Self Care | Payer: Medicare Other | Source: Ambulatory Visit | Attending: Internal Medicine | Admitting: Internal Medicine

## 2018-12-01 ENCOUNTER — Ambulatory Visit (HOSPITAL_COMMUNITY)
Admission: RE | Admit: 2018-12-01 | Discharge: 2018-12-01 | Disposition: A | Discharge status: Home / Self Care | Payer: Medicare Other | Source: Ambulatory Visit | Attending: Student | Admitting: Student

## 2018-12-01 ENCOUNTER — Other Ambulatory Visit (HOSPITAL_COMMUNITY): Payer: Self-pay | Admitting: Student

## 2018-12-01 ENCOUNTER — Other Ambulatory Visit: Payer: Self-pay

## 2018-12-01 ENCOUNTER — Encounter (HOSPITAL_COMMUNITY): Payer: Self-pay | Admitting: Student

## 2018-12-01 DIAGNOSIS — Z9889 Other specified postprocedural states: Secondary | ICD-10-CM

## 2018-12-01 DIAGNOSIS — D72820 Lymphocytosis (symptomatic): Secondary | ICD-10-CM | POA: Diagnosis not present

## 2018-12-01 DIAGNOSIS — J90 Pleural effusion, not elsewhere classified: Secondary | ICD-10-CM | POA: Insufficient documentation

## 2018-12-01 HISTORY — PX: IR THORACENTESIS ASP PLEURAL SPACE W/IMG GUIDE: IMG5380

## 2018-12-01 LAB — BODY FLUID CELL COUNT WITH DIFFERENTIAL
Eos, Fluid: 0 %
LYMPHS FL: 97 %
Monocyte-Macrophage-Serous Fluid: 2 % — ABNORMAL LOW (ref 50–90)
Neutrophil Count, Fluid: 1 % (ref 0–25)
Total Nucleated Cell Count, Fluid: 1183 cu mm — ABNORMAL HIGH (ref 0–1000)

## 2018-12-01 LAB — LACTATE DEHYDROGENASE, PLEURAL OR PERITONEAL FLUID: LD, Fluid: 82 U/L — ABNORMAL HIGH (ref 3–23)

## 2018-12-01 LAB — PROTEIN, PLEURAL OR PERITONEAL FLUID: Total protein, fluid: <3 g/dL

## 2018-12-01 LAB — GRAM STAIN

## 2018-12-01 MED ORDER — LIDOCAINE HCL 1 % IJ SOLN
INTRAMUSCULAR | Status: AC
  Filled 2018-12-01: qty 20

## 2018-12-01 MED ORDER — LIDOCAINE HCL 1 % IJ SOLN
INTRAMUSCULAR | Status: DC | PRN
  Administered 2018-12-01: 10 mL

## 2018-12-01 NOTE — Procedures (Signed)
PROCEDURE SUMMARY:  Successful image-guided right thoracentesis. Yielded 1 liter of hazy amber fluid. Patient tolerated procedure well. No immediate complications. EBL = 0 mL.  Specimen was sent for labs. CXR ordered.  Claris Pong Aaylah Pokorny PA-C 12/01/2018 1:11 PM

## 2018-12-06 LAB — CULTURE, BODY FLUID-BOTTLE

## 2018-12-06 LAB — CULTURE, BODY FLUID W GRAM STAIN -BOTTLE: Culture: NO GROWTH

## 2019-06-01 IMAGING — CT CT ABD-PELV W/O
2 of 4 series · 14 of 46 positions shown, 16 images · non-contrast
Comparison: None.

CLINICAL DATA: History of shingles, right upper flank

EXAM:
CT ABDOMEN AND PELVIS WITHOUT CONTRAST
TECHNIQUE: Multidetector CT imaging of the abdomen and pelvis was performed
following the standard protocol without IV contrast. Oral enteric
contrast was administered.

[Series 2: routine abdomen pelvis without 5.00 br40 s3 ax · axial · non-contrast · 0.82mm/px · z∈[+1232,+1642]mm · 11 of 100 slices shown, 13 images]
[im 9/100  soft-tissue]
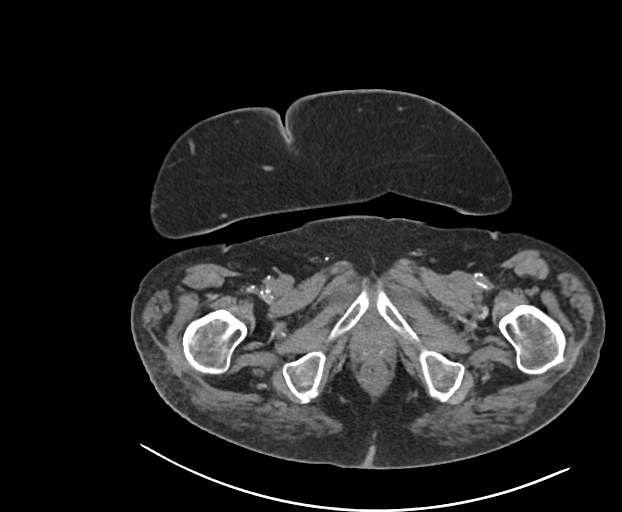
[im 9/100  bone]
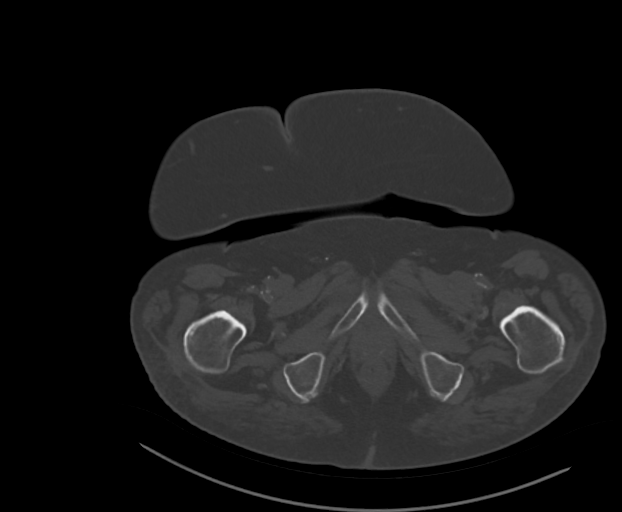
[im 17/100  soft-tissue]
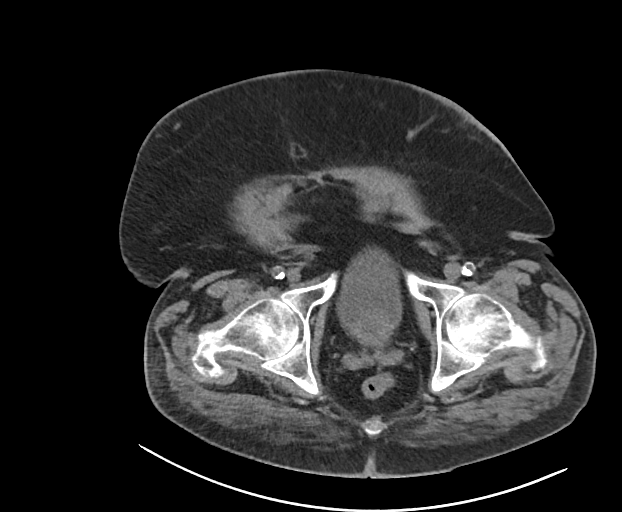
[im 25/100  soft-tissue]
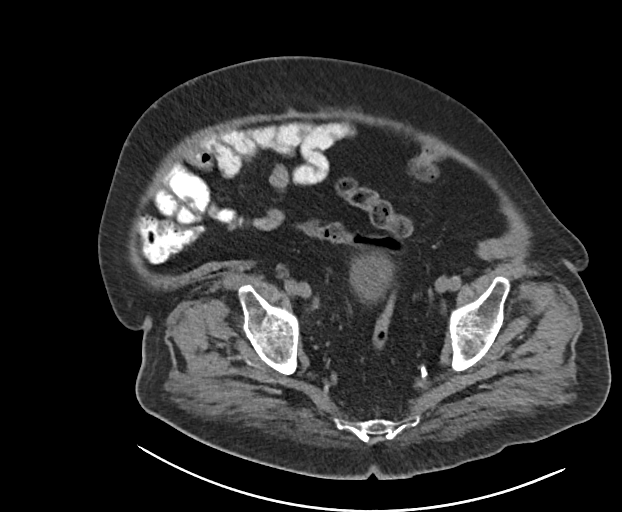
[im 34/100  soft-tissue]
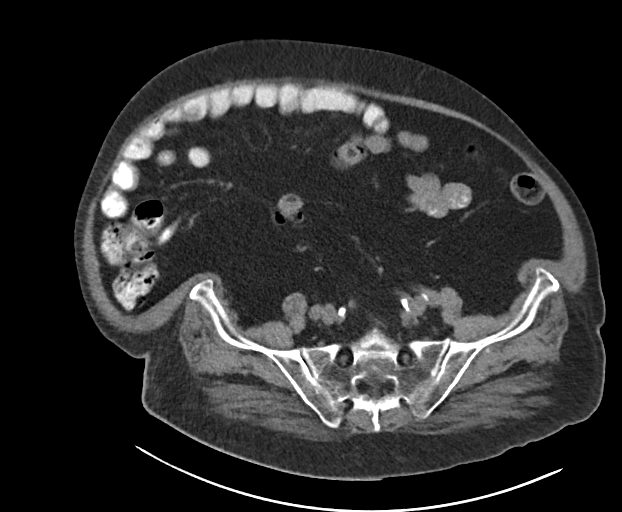
[im 42/100  soft-tissue]
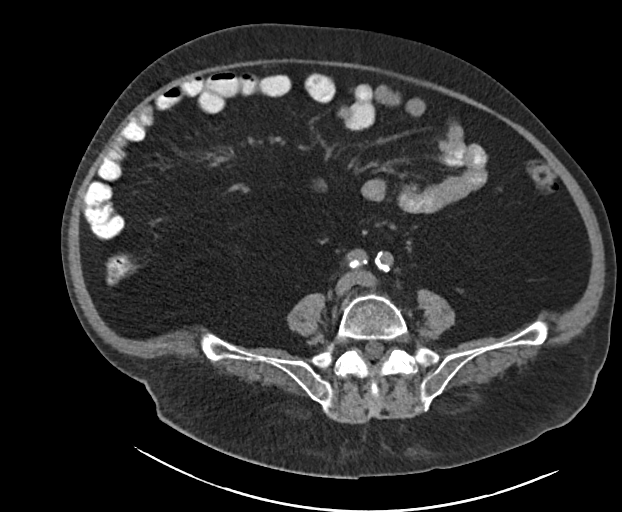
[im 50/100  soft-tissue]
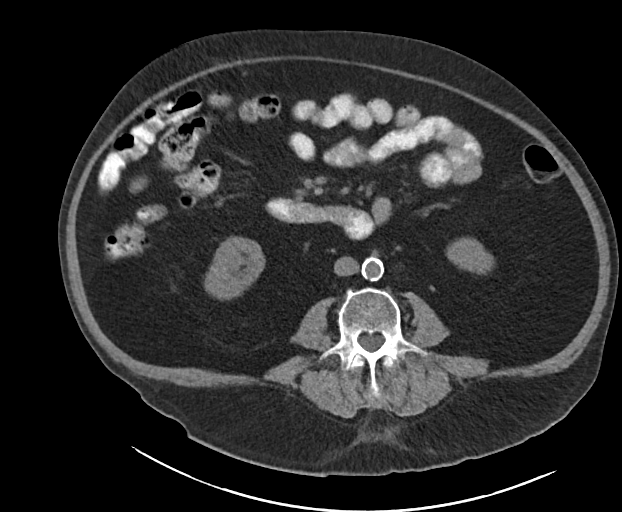
[im 58/100  soft-tissue]
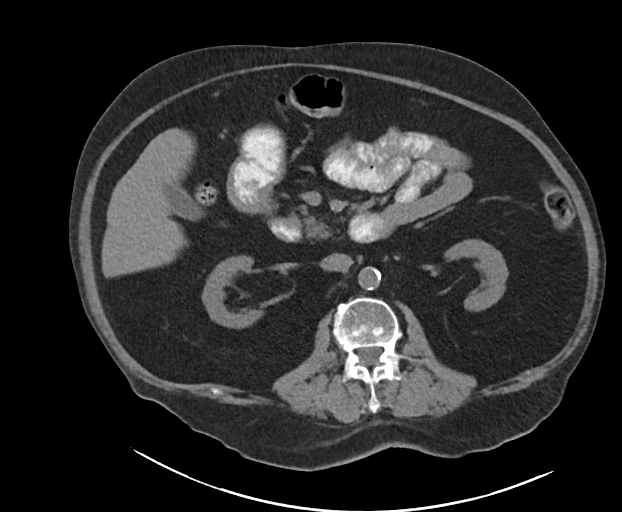
[im 67/100  soft-tissue]
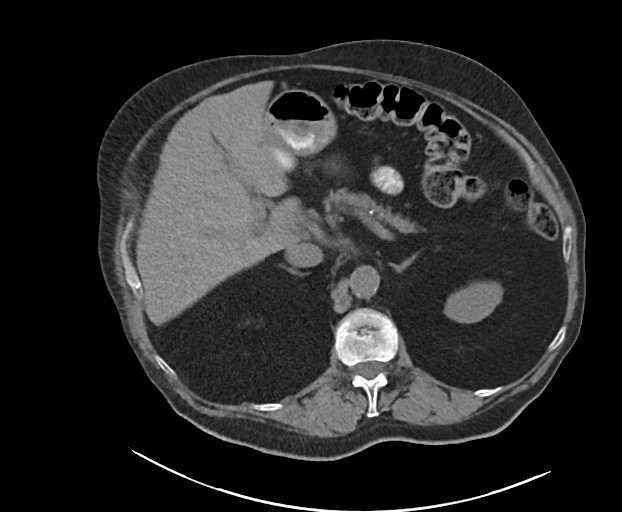
[im 75/100  soft-tissue]
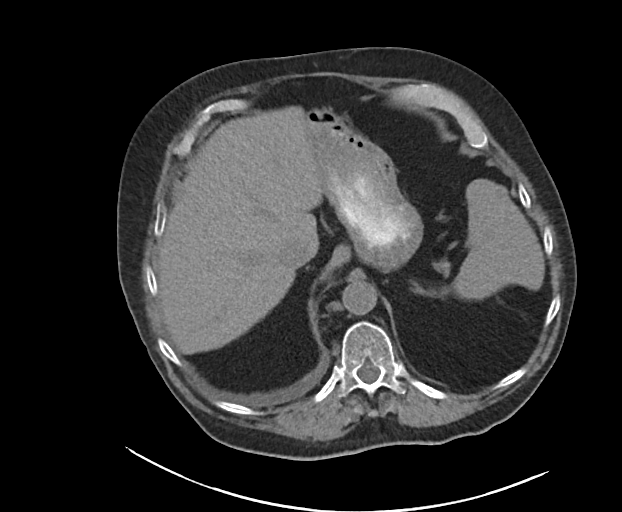
[im 75/100  bone]
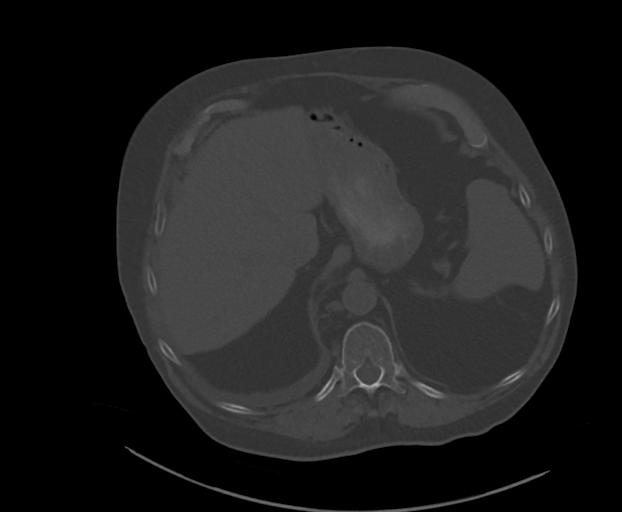
[im 83/100  soft-tissue]
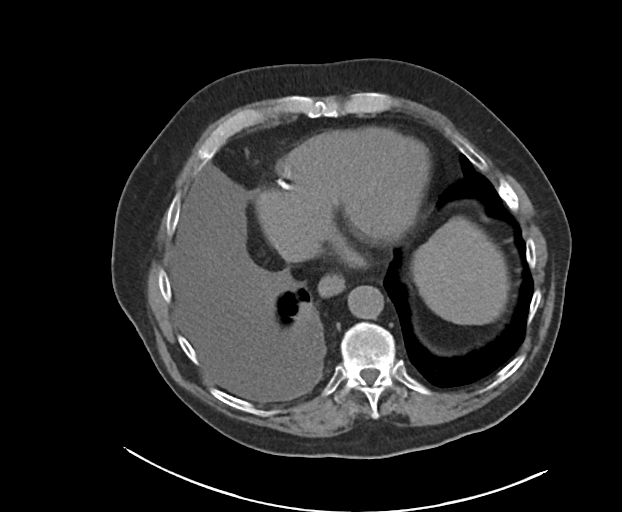
[im 91/100  soft-tissue]
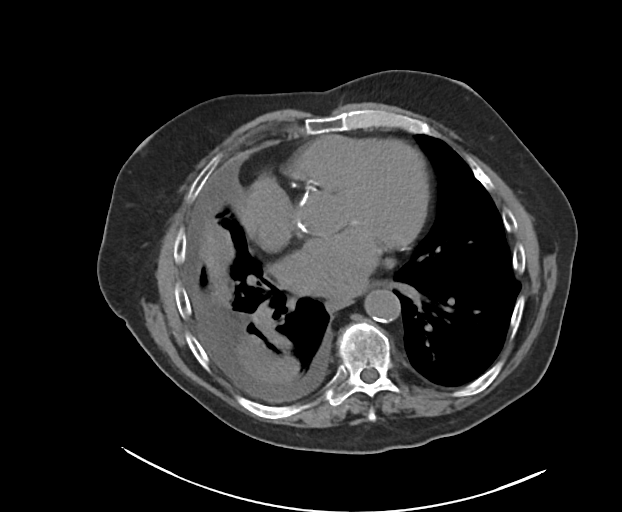

[Series 4: routine abdomen pelvis without 2.00 br40 s3 cor · coronal · non-contrast · 0.98mm/px · 3 of 201 slices shown]
[im 67/201  soft-tissue]
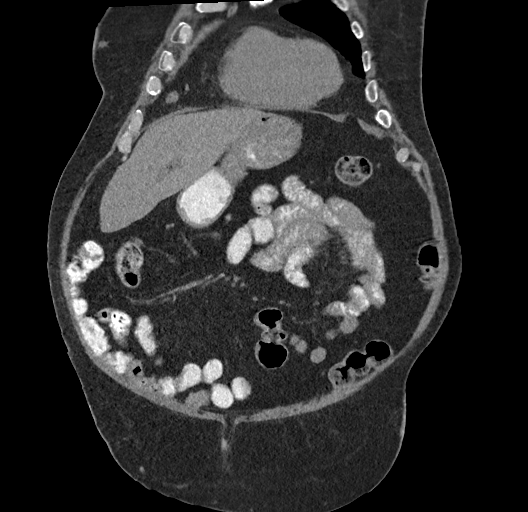
[im 89/201  soft-tissue]
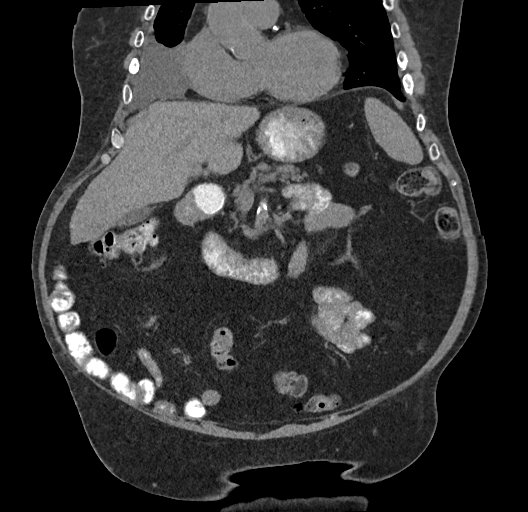
[im 112/201  soft-tissue]
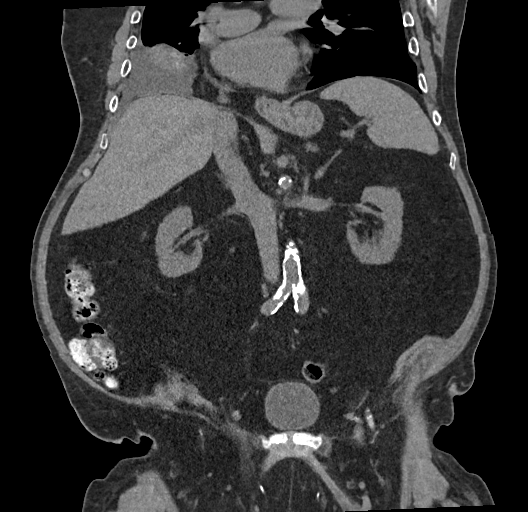

[14 of 46 positions shown; findings below may reference images not displayed]

FINDINGS: Lower chest: There is a small to moderate right pleural effusion,
with dense consolidations of the included right lower and middle
lobes. Three-vessel coronary artery calcifications.

Hepatobiliary: No focal liver abnormality is seen. Sludge or tiny
gallstones in the gallbladder. No gallbladder wall thickening, or
biliary dilatation.

Pancreas: Unremarkable. No pancreatic ductal dilatation or
surrounding inflammatory changes.

Spleen: Normal in size without focal abnormality.

Adrenals/Urinary Tract: Adrenal glands are unremarkable. Kidneys are
normal, without renal calculi, focal lesion, or hydronephrosis.
Bladder is unremarkable.

Stomach/Bowel: Stomach is within normal limits. Appendix appears
normal. No evidence of bowel wall thickening, distention, or
inflammatory changes.

Vascular/Lymphatic: Extensive calcific atherosclerosis of the
abdominal aorta and branch vessels. No enlarged abdominal or pelvic
lymph nodes.

Reproductive: No mass or other abnormality.

Other: No abdominal wall hernia or abnormality. No abdominopelvic
ascites.

Musculoskeletal: No acute or significant osseous findings.
IMPRESSION: 1. No definite non-contrast CT findings to explain right flank pain.

2. There is a small to moderate right pleural effusion, with dense
consolidations of the included right lower and middle lobes. No
prior imaging of the chest is available for comparison. Consider
dedicated CT of the chest.

3.  Sludge or tiny gallstones in the gallbladder.

4.  Other chronic and incidental findings as above.

## 2019-06-02 ENCOUNTER — Telehealth: Payer: Self-pay | Admitting: Cardiovascular Disease

## 2019-06-02 NOTE — Telephone Encounter (Signed)
It looks like he was taking lasix 80 mg am and 40 mg pm when last seen in our office in 2018. Can we check with him and see what dose of Lasix he is now taking? If he has an appointment tomorrow in our office, will likely wait until then to adjust his dosage. Thanks, chris

## 2019-06-02 NOTE — Telephone Encounter (Signed)
° ° °  Pt c/o swelling: STAT is pt has developed SOB within 24 hours  1) How much weight have you gained and in what time span? n/a  2) If swelling, where is the swelling located? FEET, ABDOMEN  Are you currently taking a fluid pill? YES Are you currently SOB? Always SOB per ex-wife  3) Do you have a log of your daily weights (if so, list)?  4) Have you gained 3 pounds in a day or 5 pounds in a week?   5) Have you traveled recently? no

## 2019-06-02 NOTE — Telephone Encounter (Signed)
Phoned and spoke with ex-wife [name] [facility]) who states pt is not experiencing any respiratory or other distress. Current lasix dose is 80 mg in am and 40 mg pm. Informed [name] that medication dosage will be evaluated at tomorrow's visit with [name]. She verbalized understanding and has no further questions or concerns.

## 2019-06-02 NOTE — Telephone Encounter (Signed)
Called patient's ex wife (DPR) about her message. Patient has not seen Dr. Angelena Form since 06/2017. Patient is having abdominal and feet swelling per ex-wife and patient is always SOB. Patient has been seen by home health nurse regularly due to on going issues. Patient's wife stated that Dr. [name], PCP, increase patient's lasix to 80 mg BID for 3 days. Patient's ex-wife stated he just finished his last dose last night, and it does not seem like it helped. Patient has an appointment with [name] NP tomorrow to be evaluated. Will forward to Dr. Angelena Form for any advisement in the mean time.

## 2019-06-03 ENCOUNTER — Ambulatory Visit: Payer: Medicare Other | Admitting: Cardiology

## 2019-06-03 NOTE — Progress Notes (Deleted)
Cardiology Office Note:    Date:  06/03/2019   ID:  Darrell Mcclain, DOB 07-01-1941, MRN [id number]  PCP:  Burnard Bunting, MD  Cardiologist:  Lauree Chandler, MD  Referring MD: Burnard Bunting, MD   No chief complaint on file. ***  History of Present Illness:    Darrell Mcclain is a 78 y.o. male with a past medical history significant for chronic diastolic CHF, diabetes type 2, CKD, hyperlipidemia, hypertension, osteoarthritis, GERD, prostate cancer s/p TURP 2017 and atrial fibrillation/flutter.  The patient was seen as a new patient in January 2015 for assessment of new onset atrial flutter.  He has been rate controlled on Cardizem and anticoagulated with Xarelto.  Echocardiogram 10/06/2013 showed normal LV size and function, no significant valvular disease.  He has been on Lasix for management of volume given chronic diastolic CHF.  Norvasc was stopped due to lower extremity edema.  Echocardiogram 08/23/2015 showed normal LV function with grade 1 diastolic dysfunction, no valvular disease.  His CKD is followed by nephrology.  The patient was last seen in the office on 07/02/2017 by Dr. Angelena Form at which time he was doing well without any cardiac symptoms.  Lower extremity edema was considered stable at the time.  The patient is here today for lower leg swelling   Cardiac studies   Echocardiogram 08/23/2015 Study Conclusions - Left ventricle: The cavity size was normal. Wall thickness was   increased in a pattern of mild LVH. Systolic function was normal.   The estimated ejection fraction was in the range of 55% to 60%.   Wall motion was normal; there were no regional wall motion   abnormalities. Doppler parameters are consistent with abnormal   left ventricular relaxation (grade 1 diastolic dysfunction).  Impressions: - Normal LV systolic function; mild LVH; grade 1 diastolic   dysfunction.  Echo 10/06/2013: EF 50-55%, mild LVH, akinesis of the basal inferior myocardium.   Moderately dilated left atrium.  Past Medical History:  Diagnosis Date  . Anticoagulant long-term use    xarelto  . Arthritis    right hand  . At risk for sleep apnea    STOP-BANG==  6      PT ALREADY SET UP TO HAVE STUDY AT [facility] , PCP AWARE  . Atrial flutter, chronic [facility]) dx 01/ 2015   cardiologist-  dr Angelena Form  . Bilateral lower extremity edema   . BPH (benign prostatic hyperplasia)   . Cerebral microvascular disease    advanced and multiple foci throughout hemispheres and brain stem-- residual memory issues  . Chronic constipation   . Chronic kidney disease (CKD), stage III (moderate) ([place])    nephrologist-  dr Marval Regal [facility])  . Diastolic CHF, chronic ([place])   . Exertional dyspnea   . Foley catheter in place   . GERD (gastroesophageal reflux disease)   . Glaucoma, both eyes   . History of adenomatous polyp of colon    2012  . History of CVA (cerebrovascular accident)    (pt had no symptoms other than memory issues) per MRI 11-29-2015  very small infarct right frontal lobe and multiple foci throughout hemispheres and brain stem  . 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 Hyperlipidemia   . Hypertension   . Iron deficiency anemia   . Memory loss   . Prostate cancer ([place])   . RBBB (right bundle branch block with left anterior fascicular block)   . Rosacea   . Type 2  diabetes mellitus ([place])   . Urine retention   . Vitamin B 12 deficiency     Past Surgical History:  Procedure Laterality Date  . CATARACT EXTRACTION W/ INTRAOCULAR LENS  IMPLANT, BILATERAL  2016  . IR THORACENTESIS ASP PLEURAL SPACE W/IMG GUIDE  12/01/2018  . ORIF PROXIMAL HUMEROUS FX AND BICEPS TENODESIS  11/09/2015  . OTHER SURGICAL HISTORY  1996   reconstruction left toe (amputation repair)  . PROSTATE BIOPSY N/A 07/22/2016   Procedure: BIOPSY TRANSRECTAL ULTRASONIC PROSTATE (TUBP);  Surgeon: Festus Aloe, MD;  Location: [facility]  Service:  Urology;  Laterality: N/A;  . TONSILLECTOMY  1951  . TRANSTHORACIC ECHOCARDIOGRAM  08/23/2015   mild LVH, ef 55-60%, grade 1 diastolitc dysfunction  . WISDOM TOOTH EXTRACTION  1985    Current Medications: No outpatient medications have been marked as taking for the 06/03/19 encounter (Appointment) with Daune Perch, NP.   Current Facility-Administered Medications for the 06/03/19 encounter (Appointment) with Daune Perch, NP  Medication  . 0.9 %  sodium chloride infusion     Allergies:   Ciprofloxacin and Oxycodone   Social History   Socioeconomic History  . Marital status: Divorced    Spouse name: Not on file  . Number of children: Not on file  . Years of education: Not on file  . Highest education level: Not on file  Occupational History  . Not on file  Social Needs  . Financial resource strain: Not on file  . Food insecurity    Worry: Not on file    Inability: Not on file  . Transportation needs    Medical: Not on file    Non-medical: Not on file  Tobacco Use  . Smoking status: Former Smoker    Packs/day: 2.00    Years: 20.00    Pack years: 40.00    Quit date: 05/09/1983    Years since quitting: 36.0  . Smokeless tobacco: Never Used  Substance and Sexual Activity  . Alcohol use: No  . Drug use: No  . Sexual activity: Not Currently  Lifestyle  . Physical activity    Days per week: Not on file    Minutes per session: Not on file  . Stress: Not on file  Relationships  . Social Herbalist on phone: Not on file    Gets together: Not on file    Attends religious service: Not on file    Active member of club or organization: Not on file    Attends meetings of clubs or organizations: Not on file    Relationship status: Not on file  Other Topics Concern  . Not on file  Social History Narrative   Lives at [facility] through rehab after falling and breaking right shoulder. Phone: [phone] Fax: [phone]        Family  History: The patient's ***family history includes Cancer in his sister. There is no history of Colon cancer, Rectal cancer, Stomach cancer, CAD, or Dementia. ROS:   Please see the history of present illness.    *** All other systems reviewed and are negative.   EKG:  EKG is *** ordered today.  The ekg ordered today demonstrates ***  Recent Labs: No results found for requested labs within last 8760 hours.   Recent Lipid Panel No results found for: CHOL, TRIG, HDL, CHOLHDL, VLDL, LDLCALC, LDLDIRECT  Physical Exam:    VS:  There were no vitals taken for this visit.  Wt Readings from Last 6 Encounters:  06/29/18 240 lb (108.9 kg)  06/03/18 240 lb (108.9 kg)  07/02/17 238 lb (108 kg)  09/10/16 241 lb 6.4 oz (109.5 kg)  07/22/16 243 lb (110.2 kg)  07/02/16 243 lb 12.8 oz (110.6 kg)     Physical Exam***   ASSESSMENT:    1. Leg swelling   2. Atrial flutter, unspecified type ([place])   3. Chronic diastolic CHF (congestive heart failure), NYHA class 2 ([place])   4. Essential (primary) hypertension   5. Hyperlipidemia, unspecified hyperlipidemia type   6. Type 2 diabetes mellitus without complication, with long-term current use of insulin ([facility])    PLAN:    In order of problems listed above:  Leg swelling  -On Lasix 80 mg daily   Atrial flutter, paroxysmal -Rate controlled on Toprol XL. -Patient is on Xarelto for stroke risk reduction with CHA2DS2/VAS Stroke Risk Points score of 5 (CHF, HTN, age (2), DM)  Chronic diastolic CHF -Patient is maintained on Lasix.  Also on beta-blocker for rate control control of atrial flutter     Hypertension -On amlodipine 5 mg daily, Lasix 80 mg daily, lisinopril 10 mg daily, Toprol-XL 50 mg twice daily - -Labs per PCP, not in epic  Hyperlipidemia -On atorvastatin 20 mg daily -Lipids followed at primary care  Diabetes type 2 on insulin -Management per PCP.  Medication Adjustments/Labs and Tests Ordered: Current medicines are  reviewed at length with the patient today.  Concerns regarding medicines are outlined above. Labs and tests ordered and medication changes are outlined in the patient instructions below:  There are no Patient Instructions on file for this visit.   Signed, Daune Perch, NP  06/03/2019 8:02 AM    [place]

## 2019-06-09 NOTE — Progress Notes (Deleted)
Cardiology Office Note:    Date:  06/09/2019   ID:  Darrell Mcclain, DOB 1941-02-28, MRN [id number]  PCP:  Burnard Bunting, MD  Cardiologist:  Lauree Chandler, MD *** Electrophysiologist:  None   Referring MD: Burnard Bunting, MD   No chief complaint on file. ***  History of Present Illness:    Darrell Mcclain is a 78 y.o. male with:   Chronic diastolic CHF  Atrial fibrillation/Flutter  Hx of CVA  Diabetes mellitus   Chronic kidney disease   Followed by Nephrology   Hyperlipidemia   Hypertension   Amlodipine DC'd 2/2 LE edema  DJD  GERD  BPH  Prostate CA  R Pleural Effusion s/p tap in 11/2018  Darrell Mcclain was last seen by Dr. Angelena Form in 06/2017.  The DICTATELATER SmartLink is not supported in this context. ***  Prior CV studies:   The following studies were reviewed today:  Echocardiogram 08/23/15 Mild LVH, EF 55-60, no RWMA, Gr 1 DD  Echocardiogram 10/06/13 Mild LVH, EF 50-55, inf AK, mild dilated Ao root, mildly dilated ascending aorta  Past Medical History:  Diagnosis Date  . Anticoagulant long-term use    xarelto  . Arthritis    right hand  . At risk for sleep apnea    STOP-BANG==  6      PT ALREADY SET UP TO HAVE STUDY AT [facility] , PCP AWARE  . Atrial flutter, chronic [facility]) dx 01/ 2015   cardiologist-  dr Angelena Form  . Bilateral lower extremity edema   . BPH (benign prostatic hyperplasia)   . Cerebral microvascular disease    advanced and multiple foci throughout hemispheres and brain stem-- residual memory issues  . Chronic constipation   . Chronic kidney disease (CKD), stage III (moderate) ([place])    nephrologist-  dr Marval [facility])  . Diastolic CHF, chronic ([place])   . Exertional dyspnea   . Foley catheter in place   . GERD (gastroesophageal reflux disease)   . Glaucoma, both eyes   . History of adenomatous polyp of colon    2012  . History of CVA (cerebrovascular accident)    (pt had no symptoms  other than memory issues) per MRI 11-29-2015  very small infarct right frontal lobe and multiple foci throughout hemispheres and brain stem  . 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 Hyperlipidemia   . Hypertension   . Iron deficiency anemia   . Memory loss   . Prostate cancer ([place])   . RBBB (right bundle branch block with left anterior fascicular block)   . Rosacea   . Type 2 diabetes mellitus ([place])   . Urine retention   . Vitamin B 12 deficiency    Surgical Hx: The patient  has a past surgical history that includes Wisdom tooth extraction (1985); Tonsillectomy (1951); ORIF PROXIMAL HUMEROUS FX AND BICEPS TENODESIS (11/09/2015); transthoracic echocardiogram (08/23/2015); OTHER SURGICAL HISTORY (1996); Cataract extraction w/ intraocular lens  implant, bilateral (2016); Prostate biopsy (N/A, 07/22/2016); and IR THORACENTESIS ASP PLEURAL SPACE W/IMG GUIDE (12/01/2018).   Current Medications: No outpatient medications have been marked as taking for the 06/10/19 encounter (Appointment) with Richardson Dopp T, PA-C.   Current Facility-Administered Medications for the 06/10/19 encounter (Appointment) with Liliane Shi, PA-C  Medication  . 0.9 %  sodium chloride infusion     Allergies:   Ciprofloxacin and Oxycodone   Social History   Tobacco Use  . Smoking status: Former Smoker  Packs/day: 2.00    Years: 20.00    Pack years: 40.00    Quit date: 05/09/1983    Years since quitting: 36.1  . Smokeless tobacco: Never Used  Substance Use Topics  . Alcohol use: No  . Drug use: No     Family Hx: The patient's family history includes Cancer in his sister. There is no history of Colon cancer, Rectal cancer, Stomach cancer, CAD, or Dementia.  ROS:   Please see the history of present illness.    ROS All other systems reviewed and are negative.   EKGs/Labs/Other Test Reviewed:    EKG:  EKG is *** ordered today.  The ekg ordered today demonstrates ***  Recent Labs: No results  found for requested labs within last 8760 hours.   Recent Lipid Panel No results found for: CHOL, TRIG, HDL, CHOLHDL, LDLCALC, LDLDIRECT  Physical Exam:    VS:  There were no vitals taken for this visit.    Wt Readings from Last 3 Encounters:  06/29/18 240 lb (108.9 kg)  06/03/18 240 lb (108.9 kg)  07/02/17 238 lb (108 kg)     ***Physical Exam  ASSESSMENT & PLAN:    ***  Dispo:  No follow-ups on file.   Medication Adjustments/Labs and Tests Ordered: Current medicines are reviewed at length with the patient today.  Concerns regarding medicines are outlined above.  Tests Ordered: No orders of the defined types were placed in this encounter.  Medication Changes: No orders of the defined types were placed in this encounter.   Signed, Richardson Dopp, PA-C  06/09/2019 9:34 PM    [facility] [address], [place], [place]  [address] Phone: [phone]; Fax: [phone]

## 2019-06-10 ENCOUNTER — Ambulatory Visit: Payer: Medicare Other | Admitting: Physician Assistant

## 2019-06-16 ENCOUNTER — Ambulatory Visit: Payer: Medicare Other | Admitting: Cardiology

## 2019-06-29 ENCOUNTER — Telehealth: Payer: Self-pay

## 2019-06-29 NOTE — Telephone Encounter (Signed)
Follow up   Patient's wife states that can't come on tomorrow. Please call to discuss.

## 2019-06-29 NOTE — Telephone Encounter (Signed)
Called pt to set up OV with CM 06/30/2019, left message asking pt to  Call the office.

## 2019-07-13 IMAGING — US IR THORACENTESIS ASP PLEURAL SPACE W/IMG GUIDE
1 series · 3 of 3 positions shown · non-contrast
Comparison: none

INDICATION: Patient with history of diastolic heart failure, CKD stage 3,
prostate cancer, dyspnea, and right pleural effusion. Request is
made for diagnostic and therapeutic right thoracentesis.

[Series 1: ir (id) (id)/(id)/(id) ir · 3 of 3 slices shown]
[im 1/3]
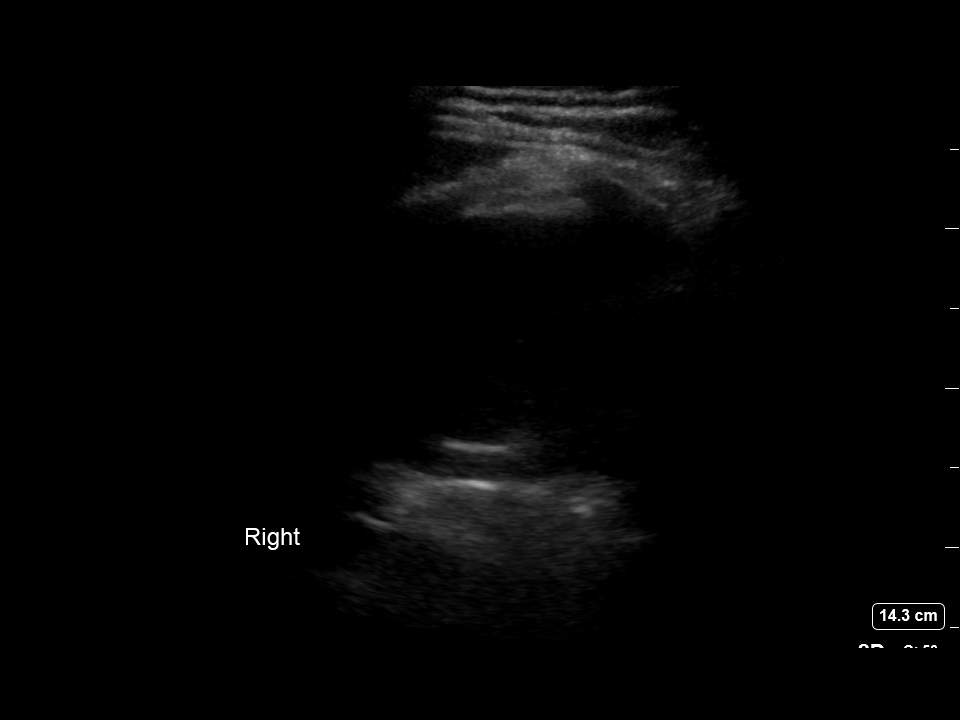
[im 2/3]
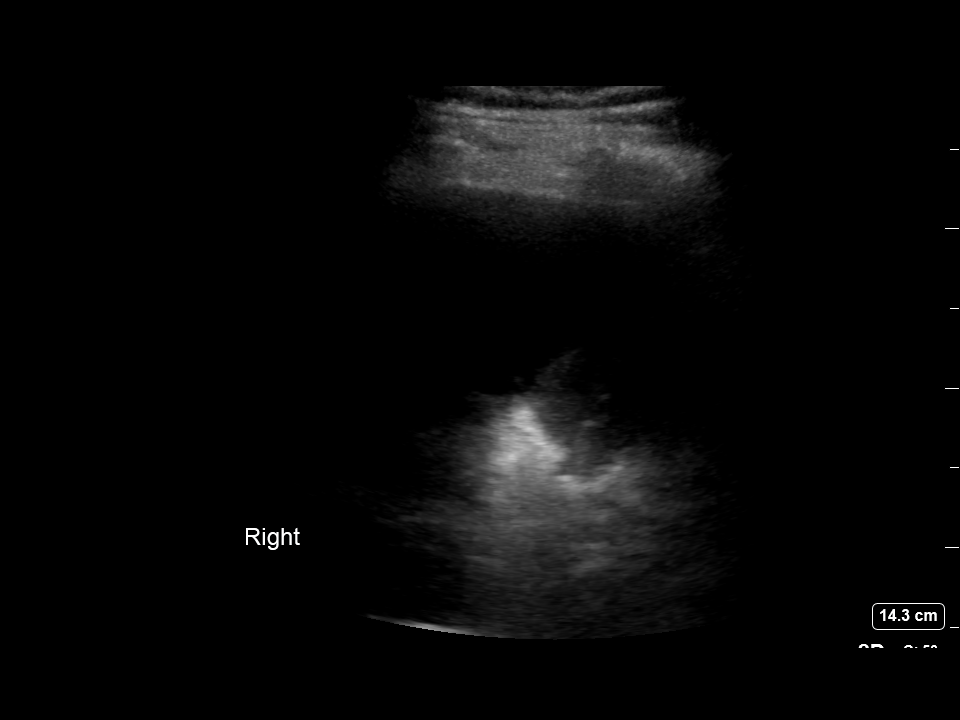
[im 3/3]
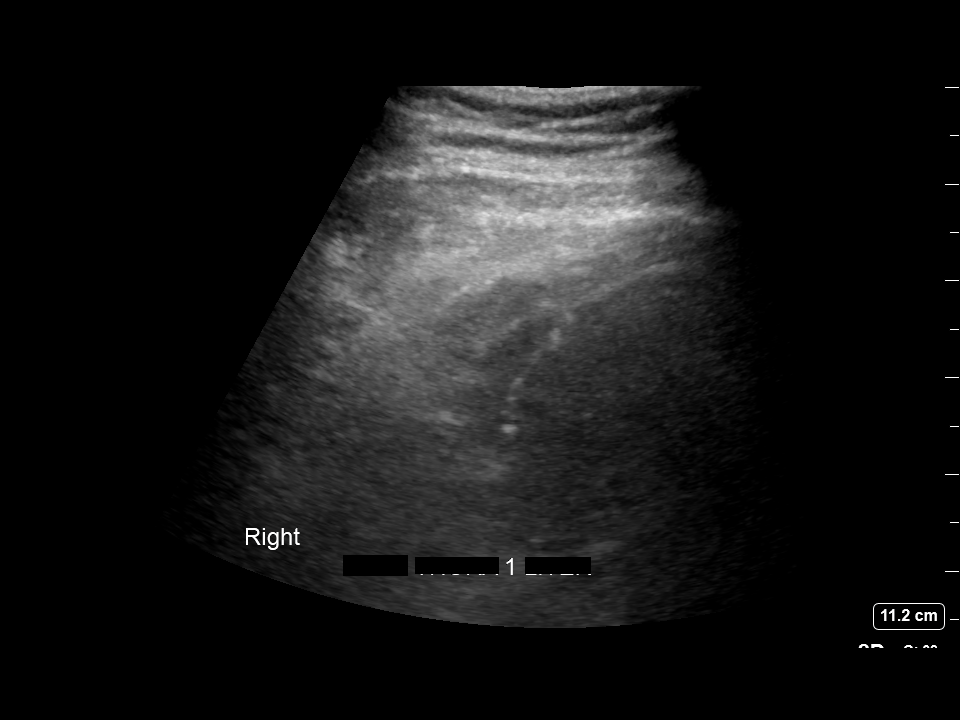

[3 of 3 positions shown; findings below may reference images not displayed]

EXAM:
ULTRASOUND GUIDED DIAGNOSTIC AND THERAPEUTIC RIGHT THORACENTESIS

MEDICATIONS:
10 mL 1% lidocaine

COMPLICATIONS:
None immediate.

PROCEDURE:
An ultrasound guided thoracentesis was thoroughly discussed with the
patient and questions answered. The benefits, risks, alternatives
and complications were also discussed. The patient understands and
wishes to proceed with the procedure. Written consent was obtained.

Ultrasound was performed to localize and mark an adequate pocket of
fluid in the right chest. The area was then prepped and draped in
the normal sterile fashion. 1% Lidocaine was used for local
anesthesia. Under ultrasound guidance a 6 Fr Safe-T-Centesis
catheter was introduced. Thoracentesis was performed. The catheter
was removed and a dressing applied.
FINDINGS: A total of approximately 1 L of hazy amber fluid was removed.
Samples were sent to the laboratory as requested by the clinical
team.
IMPRESSION: Successful ultrasound guided right thoracentesis yielding 1 L of
pleural fluid.

## 2019-08-20 ENCOUNTER — Emergency Department (HOSPITAL_COMMUNITY)
Admission: EM | Admit: 2019-08-20 | Discharge: 2019-08-21 | Disposition: A | Discharge status: Home / Self Care | Payer: Medicare Other | Attending: Emergency Medicine | Admitting: Emergency Medicine

## 2019-08-20 ENCOUNTER — Encounter (HOSPITAL_COMMUNITY): Payer: Self-pay | Admitting: Emergency Medicine

## 2019-08-20 ENCOUNTER — Emergency Department (HOSPITAL_COMMUNITY): Payer: Medicare Other

## 2019-08-20 ENCOUNTER — Other Ambulatory Visit: Payer: Self-pay

## 2019-08-20 DIAGNOSIS — Z20828 Contact with and (suspected) exposure to other viral communicable diseases: Secondary | ICD-10-CM | POA: Diagnosis not present

## 2019-08-20 DIAGNOSIS — Z8546 Personal history of malignant neoplasm of prostate: Secondary | ICD-10-CM | POA: Insufficient documentation

## 2019-08-20 DIAGNOSIS — R5383 Other fatigue: Secondary | ICD-10-CM

## 2019-08-20 DIAGNOSIS — I130 Hypertensive heart and chronic kidney disease with heart failure and stage 1 through stage 4 chronic kidney disease, or unspecified chronic kidney disease: Secondary | ICD-10-CM | POA: Insufficient documentation

## 2019-08-20 DIAGNOSIS — N183 Chronic kidney disease, stage 3 unspecified: Secondary | ICD-10-CM | POA: Insufficient documentation

## 2019-08-20 DIAGNOSIS — Z794 Long term (current) use of insulin: Secondary | ICD-10-CM | POA: Diagnosis not present

## 2019-08-20 DIAGNOSIS — Z8673 Personal history of transient ischemic attack (TIA), and cerebral infarction without residual deficits: Secondary | ICD-10-CM | POA: Insufficient documentation

## 2019-08-20 DIAGNOSIS — Z79899 Other long term (current) drug therapy: Secondary | ICD-10-CM | POA: Insufficient documentation

## 2019-08-20 DIAGNOSIS — Z7901 Long term (current) use of anticoagulants: Secondary | ICD-10-CM | POA: Insufficient documentation

## 2019-08-20 DIAGNOSIS — Z87891 Personal history of nicotine dependence: Secondary | ICD-10-CM | POA: Diagnosis not present

## 2019-08-20 DIAGNOSIS — I5032 Chronic diastolic (congestive) heart failure: Secondary | ICD-10-CM | POA: Insufficient documentation

## 2019-08-20 DIAGNOSIS — R05 Cough: Secondary | ICD-10-CM | POA: Diagnosis present

## 2019-08-20 DIAGNOSIS — E1122 Type 2 diabetes mellitus with diabetic chronic kidney disease: Secondary | ICD-10-CM | POA: Insufficient documentation

## 2019-08-20 LAB — CBC WITH DIFFERENTIAL/PLATELET
Abs Immature Granulocytes: 0.03 10*3/uL (ref 0.00–0.07)
Basophils Absolute: 0 10*3/uL (ref 0.0–0.1)
Basophils Relative: 0 %
Eosinophils Absolute: 0.1 10*3/uL (ref 0.0–0.5)
Eosinophils Relative: 1 %
HCT: 36.2 % — ABNORMAL LOW (ref 39.0–52.0)
Hemoglobin: 10.2 g/dL — ABNORMAL LOW (ref 13.0–17.0)
Immature Granulocytes: 0 %
Lymphocytes Relative: 8 %
Lymphs Abs: 0.7 10*3/uL (ref 0.7–4.0)
MCH: 25.4 pg — ABNORMAL LOW (ref 26.0–34.0)
MCHC: 28.2 g/dL — ABNORMAL LOW (ref 30.0–36.0)
MCV: 90.3 fL (ref 80.0–100.0)
Monocytes Absolute: 0.8 10*3/uL (ref 0.1–1.0)
Monocytes Relative: 9 %
Neutro Abs: 7.4 10*3/uL (ref 1.7–7.7)
Neutrophils Relative %: 82 %
Platelets: 214 10*3/uL (ref 150–400)
RBC: 4.01 MIL/uL — ABNORMAL LOW (ref 4.22–5.81)
RDW: 18.5 % — ABNORMAL HIGH (ref 11.5–15.5)
WBC: 9 10*3/uL (ref 4.0–10.5)
nRBC: 0 % (ref 0.0–0.2)

## 2019-08-20 LAB — BASIC METABOLIC PANEL
Anion gap: 8 (ref 5–15)
BUN: 42 mg/dL — ABNORMAL HIGH (ref 8–23)
CO2: 32 mmol/L (ref 22–32)
Calcium: 8.2 mg/dL — ABNORMAL LOW (ref 8.9–10.3)
Chloride: 99 mmol/L (ref 98–111)
Creatinine, Ser: 1.68 mg/dL — ABNORMAL HIGH (ref 0.61–1.24)
GFR calc Af Amer: 45 mL/min — ABNORMAL LOW (ref >60)
GFR calc non Af Amer: 39 mL/min — ABNORMAL LOW (ref >60)
Glucose, Bld: 266 mg/dL — ABNORMAL HIGH (ref 70–99)
Potassium: 3.7 mmol/L (ref 3.5–5.1)
Sodium: 139 mmol/L (ref 135–145)

## 2019-08-20 LAB — POC SARS CORONAVIRUS 2 AG -  ED: SARS Coronavirus 2 Ag: NEGATIVE

## 2019-08-20 NOTE — ED Triage Notes (Signed)
Pt comes to ed via ems, c/o cold and flu like symptoms, pt told staff he wants him tested for covid,  Pt from nursing facility.  Pt alert x 4. Pt requested to nursing home to be checked out. V/s 110/50,hr 76, spo2 96 2 liters, his baseline, temp 98.3, 20 in LFA.

## 2019-08-21 NOTE — Discharge Instructions (Signed)
Please return for worsening concerns.  Your labwork looks similar to your prior.  Please follow up with your family doctor.

## 2019-08-21 NOTE — ED Provider Notes (Signed)
[facility] DEPT Provider Note   CSN: [id number] Arrival date & time: 08/20/19  2047     History Chief Complaint  Patient presents with  . Cough  . Fatigue    [facility] Mcclain is a 78 y.o. male.  78 yo M with a chief complaints of feeling unwell.  This is gone on for the past couple days but is actually better today.  He states that where he lives they have been telling him he needs to come to the hospital to get tested for the coronavirus but he did feel like he needed to go.  Today as he was feeling better he thought that he would like to be evaluated.  When I asked him what he would like to be evaluated for he was not sure.  States that he feels fine and is good to go back to his facility.  He has had a mild cough.  Denies nausea vomiting or diarrhea denies abdominal pain denies chest pain or pressure.  The history is provided by the patient.  Cough Associated symptoms: no chest pain, no chills, no eye discharge, no fever, no headaches, no myalgias, no rash and no shortness of breath   Illness Severity:  Moderate Onset quality:  Gradual Duration:  2 days Timing:  Constant Progression:  Partially resolved Chronicity:  New Associated symptoms: cough   Associated symptoms: no abdominal pain, no chest pain, no congestion, no diarrhea, no fever, no headaches, no myalgias, no rash, no shortness of breath and no vomiting        Past Medical History:  Diagnosis Date  . Anticoagulant long-term use    xarelto  . Arthritis    right hand  . At risk for sleep apnea    STOP-BANG==  6      PT ALREADY SET UP TO HAVE STUDY AT [facility] , PCP AWARE  . Atrial flutter, chronic [facility]) dx 01/ 2015   cardiologist-  dr [facility] Mcclain  . Bilateral lower extremity edema   . BPH (benign prostatic hyperplasia)   . Cerebral microvascular disease    advanced and multiple foci throughout hemispheres and brain stem-- residual memory issues  . Chronic constipation     . Chronic kidney disease (CKD), stage III (moderate)    nephrologist-  dr [facility])  . Diastolic CHF, chronic ([place])   . Exertional dyspnea   . Foley catheter in place   . GERD (gastroesophageal reflux disease)   . Glaucoma, both eyes   . History of adenomatous polyp of colon    2012  . History of CVA (cerebrovascular accident)    (pt had no symptoms other than memory issues) per MRI 11-29-2015  very small infarct right frontal lobe and multiple foci throughout hemispheres and brain stem  . 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 Hyperlipidemia   . Hypertension   . Iron deficiency anemia   . Memory loss   . Prostate cancer ([place])   . RBBB (right bundle branch block with left anterior fascicular block)   . Rosacea   . Type 2 diabetes mellitus ([place])   . Urine retention   . Vitamin B 12 deficiency     Patient Active Problem List   Diagnosis Date Noted  . Malignant neoplasm of prostate ([place]) 09/10/2016  . Falls 11/21/2015  . Memory loss 11/21/2015  . Uncontrolled diabetes mellitus ([place]) 11/21/2015  . Morbid obesity ([place]) 11/21/2015  . Hyperlipidemia 11/21/2015  .  Essential hypertension 11/21/2015  . 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 Chronic diastolic CHF (congestive heart failure), NYHA class 2 ([place]) 02/28/2014  . Atrial flutter ([place]) 10/13/2013    Past Surgical History:  Procedure Laterality Date  . CATARACT EXTRACTION W/ INTRAOCULAR LENS  IMPLANT, BILATERAL  2016  . IR THORACENTESIS ASP PLEURAL SPACE W/IMG GUIDE  12/01/2018  . ORIF PROXIMAL HUMEROUS FX AND BICEPS TENODESIS  11/09/2015  . OTHER SURGICAL HISTORY  1996   reconstruction left toe (amputation repair)  . PROSTATE BIOPSY N/A 07/22/2016   Procedure: BIOPSY TRANSRECTAL ULTRASONIC PROSTATE (TUBP);  Surgeon: [facility] Aloe, MD;  Location: [facility];  Service: Urology;  Laterality: N/A;  . TONSILLECTOMY  1951  . TRANSTHORACIC ECHOCARDIOGRAM  08/23/2015    mild LVH, ef 55-60%, grade 1 diastolitc dysfunction  . WISDOM TOOTH EXTRACTION  1985       Family History  Problem Relation Age of Onset  . Cancer Sister        lung to brain/smoker  . Colon cancer Neg Hx   . Rectal cancer Neg Hx   . Stomach cancer Neg Hx   . CAD Neg Hx   . Dementia Neg Hx     Social History   Tobacco Use  . Smoking status: Former Smoker    Packs/day: 2.00    Years: 20.00    Pack years: 40.00    Quit date: 05/09/1983    Years since quitting: 36.3  . Smokeless tobacco: Never Used  Substance Use Topics  . Alcohol use: No  . Drug use: No    Home Medications Prior to Admission medications   Medication Sig Start Date End Date Taking? Authorizing Provider  acetaminophen (TYLENOL) 500 MG tablet Take 1,000 mg by mouth every 6 (six) hours as needed for mild pain, moderate pain, fever or headache.    [provider]  amLODipine (NORVASC) 5 MG tablet Take by mouth.    [provider]  atorvastatin (LIPITOR) 20 MG tablet Take 1 tablet (20 mg total) by mouth daily. Patient taking differently: Take 20 mg by mouth every evening.  09/22/13   [facility] Blanks, MD  donepezil (ARICEPT) 10 MG tablet Take 1 tablet (10 mg total) by mouth at bedtime. 11/09/17   [facility] Beam, MD  dorzolamide (TRUSOPT) 2 % ophthalmic solution Place 1 drop into both eyes 2 (two) times daily.  06/20/15   [provider]  finasteride (PROSCAR) 5 MG tablet Take 5 mg by mouth every evening.  05/16/16   [provider]  furosemide (LASIX) 40 MG tablet Take two (2) tablets (80 mg total) by mouth each morning and take one (1) tablet (40 mg total) by mouth each afternoon. Please make appt. 01/07/18   [facility]rell Blanks, MD  Hyoscyamine Sulfate SL (LEVSIN/SL) 0.125 MG SUBL Dissolve 1 tablet on the tongue at the first sign of diarrhea episodes, you may use 1 tablet every 6 hours as needed. 06/03/18   Mcclain, Darrell S, PA-C  insulin aspart (NOVOLOG) 100 UNIT/ML  injection Inject 6 Units into the skin 3 (three) times daily before meals.     [provider]  insulin glargine (LANTUS) 100 UNIT/ML injection Inject 40 Units into the skin at bedtime.     [provider]  lisinopril (PRINIVIL,ZESTRIL) 20 MG tablet Take 10 mg by mouth daily.     [provider]  metFORMIN (GLUCOPHAGE) 1000 MG tablet Take 1,000 mg by mouth 2 (two) times daily with  a meal.    [provider]  metoprolol succinate (TOPROL-XL) 50 MG 24 hr tablet Take 1 tablet (50 mg total) by mouth 2 (two) times daily. Take with or immediately following a meal. Patient taking differently: Take 25 mg by mouth daily. Take with or immediately following a meal. 04/04/15   [facility] Blanks, MD  omeprazole (PRILOSEC) 20 MG capsule Take 20 mg by mouth every morning.  04/26/11   [provider]  polyethylene glycol (MIRALAX / GLYCOLAX) packet Take 17 g by mouth daily.    [provider]  potassium chloride (MICRO-K) 10 MEQ CR capsule TK 1 C PO D 09/09/18   [provider]  PRESCRIPTION MEDICATION every 6 (six) weeks. Injections in eyes at dr's office    [provider]  rivaroxaban (XARELTO) 20 MG TABS tablet Take 1 tablet (20 mg total) by mouth daily with supper. 07/23/16   [facility] Aloe, MD  tamsulosin (FLOMAX) 0.4 MG CAPS capsule Take 0.4 mg by mouth daily with breakfast.  01/09/14   [provider]    Allergies    Ciprofloxacin and Oxycodone  Review of Systems   Review of Systems  Constitutional: Negative for chills and fever.  HENT: Negative for congestion and facial swelling.   Eyes: Negative for discharge and visual disturbance.  Respiratory: Positive for cough. Negative for shortness of breath.   Cardiovascular: Negative for chest pain and palpitations.  Gastrointestinal: Negative for abdominal pain, diarrhea and vomiting.  Musculoskeletal: Negative for arthralgias and myalgias.  Skin: Negative for color  change and rash.  Neurological: Negative for tremors, syncope and headaches.  Psychiatric/Behavioral: Negative for confusion and dysphoric mood.    Physical Exam Updated Vital Signs BP 118/70   Pulse 73   Temp 98.7 F (37.1 C) (Oral)   Resp (!) 21   SpO2 99%   Physical Exam Vitals and nursing note reviewed.  Constitutional:      Appearance: He is well-developed.  HENT:     Head: Normocephalic and atraumatic.  Eyes:     Pupils: Pupils are equal, round, and reactive to light.  Neck:     Vascular: No JVD.  Cardiovascular:     Rate and Rhythm: Normal rate and regular rhythm.     Heart sounds: No murmur. No friction rub. No gallop.   Pulmonary:     Effort: No respiratory distress.     Breath sounds: No wheezing.  Abdominal:     General: There is no distension.     Tenderness: There is no abdominal tenderness. There is no guarding or rebound.  Musculoskeletal:        General: Normal range of motion.     Cervical back: Normal range of motion and neck supple.  Skin:    Coloration: Skin is not pale.     Findings: No rash.  Neurological:     Mental Status: He is alert and oriented to person, place, and time.  Psychiatric:        Behavior: Behavior normal.     ED Results / Procedures / Treatments   Labs (all labs ordered are listed, but only abnormal results are displayed) Labs Reviewed  BASIC METABOLIC PANEL - Abnormal; Notable for the following components:      Result Value   Glucose, Bld 266 (*)    BUN 42 (*)    Creatinine, Ser 1.68 (*)    Calcium 8.2 (*)    GFR calc non Af Amer 39 (*)    GFR  calc Af Amer 45 (*)    All other components within normal limits  CBC WITH DIFFERENTIAL/PLATELET - Abnormal; Notable for the following components:   RBC 4.01 (*)    Hemoglobin 10.2 (*)    HCT 36.2 (*)    MCH 25.4 (*)    MCHC 28.2 (*)    RDW 18.5 (*)    All other components within normal limits  URINALYSIS, ROUTINE W REFLEX MICROSCOPIC  POC SARS CORONAVIRUS 2 AG -  ED      EKG None  Radiology DG Chest Port 1 View  Result Date: 08/20/2019 CLINICAL DATA:  Cough EXAM: PORTABLE CHEST 1 VIEW COMPARISON:  12/01/2018 FINDINGS: Stable cardiomediastinal silhouette. Moderate loculated appearing right-sided pleural effusion with persistent right basilar opacity. Probable small left pleural effusion. No pneumothorax. IMPRESSION: 1. Moderate loculated right pleural effusion with persistent right basilar opacity. 2. Probable small left pleural effusion. Electronically Signed   By: Davina Poke M.D.   On: 08/20/2019 23:13    Procedures Procedures (including critical care time)  Medications Ordered in ED Medications - No data to display  ED Course  I have reviewed the triage vital signs and the nursing notes.  Pertinent labs & imaging results that were available during my care of the patient were reviewed by me and considered in my medical decision making (see chart for details).    MDM Rules/Calculators/A&P     CHA2DS2/VAS Stroke Risk Points  Current as of 28 minutes ago     5 >= 2 Points: High Risk  1 - 1.99 Points: Medium Risk  0 Points: Low Risk    This is the only CHA2DS2/VAS Stroke Risk Points available for the past  year.: Last Change: N/A     Details    This score determines the patient's risk of having a stroke if the  patient has atrial fibrillation.       Points Metrics  1 Has Congestive Heart Failure:  Yes    Current as of 28 minutes ago  0 Has Vascular Disease:  No    Current as of 28 minutes ago  1 Has Hypertension:  Yes    Current as of 28 minutes ago  2 Age:  49    Current as of 28 minutes ago  1 Has Diabetes:  Yes    Current as of 28 minutes ago  0 Had Stroke:  No  Had TIA:  No  Had thromboembolism:  No    Current as of 28 minutes ago  0 Male:  No    Current as of 28 minutes ago                         78 yo M with a chief complaints of cough and feeling unwell.  Going on for the past 48 hours but actually improving  over the past 24.  Patient was sent by his facility for a coronavirus test.  Patient states he feels fine and would not like to be tested.  Had a rapid test that had been done prior to this discussion.  That test is negative.  Patient's chest x-ray shows a persistent right-sided pleural effusion.  He actually had a CT scan done 4 days ago.  Mild drop in his hemoglobin from last.  Patient would like to go back to his facility.  PCP follow-up.   12:19 AM:  I have discussed the diagnosis/risks/treatment options with the patient and believe the pt to be  eligible for discharge home to follow-up with PCP. We also discussed returning to the ED immediately if new or worsening sx occur. We discussed the sx which are most concerning (e.g., sudden worsening pain, fever, inability to tolerate by mouth) that necessitate immediate return. Medications administered to the patient during their visit and any new prescriptions provided to the patient are listed below.  Medications given during this visit Medications - No data to display   The patient appears reasonably screen and/or stabilized for discharge and I doubt any other medical condition or other [facility] requiring further screening, evaluation, or treatment in the ED at this time prior to discharge.    Final Clinical Impression(s) / ED Diagnoses Final diagnoses:  Fatigue, unspecified type    Rx / DC Orders ED Discharge Orders    None       Deno Etienne, DO 08/21/19 0019

## 2019-08-21 NOTE — ED Notes (Signed)
Called [facility]tar  Paper work complete  Called facility report for dc back

## 2019-09-09 ENCOUNTER — Emergency Department (HOSPITAL_COMMUNITY): Payer: Medicare Other

## 2019-09-09 ENCOUNTER — Encounter (HOSPITAL_COMMUNITY): Payer: Self-pay | Admitting: Emergency Medicine

## 2019-09-09 ENCOUNTER — Inpatient Hospital Stay (HOSPITAL_COMMUNITY)
Admission: EM | Admit: 2019-09-09 | Discharge: 2019-09-22 | DRG: 616 | Disposition: A | Discharge status: Skilled Nursing Facility | Payer: Medicare Other | Attending: Internal Medicine | Admitting: Internal Medicine

## 2019-09-09 DIAGNOSIS — I130 Hypertensive heart and chronic kidney disease with heart failure and stage 1 through stage 4 chronic kidney disease, or unspecified chronic kidney disease: Secondary | ICD-10-CM | POA: Diagnosis present

## 2019-09-09 DIAGNOSIS — L89620 Pressure ulcer of left heel, unstageable: Secondary | ICD-10-CM | POA: Diagnosis present

## 2019-09-09 DIAGNOSIS — M86672 Other chronic osteomyelitis, left ankle and foot: Secondary | ICD-10-CM

## 2019-09-09 DIAGNOSIS — I4892 Unspecified atrial flutter: Secondary | ICD-10-CM | POA: Diagnosis present

## 2019-09-09 DIAGNOSIS — Z885 Allergy status to narcotic agent status: Secondary | ICD-10-CM

## 2019-09-09 DIAGNOSIS — E785 Hyperlipidemia, unspecified: Secondary | ICD-10-CM | POA: Diagnosis present

## 2019-09-09 DIAGNOSIS — D509 Iron deficiency anemia, unspecified: Secondary | ICD-10-CM | POA: Diagnosis present

## 2019-09-09 DIAGNOSIS — Z881 Allergy status to other antibiotic agents status: Secondary | ICD-10-CM

## 2019-09-09 DIAGNOSIS — C61 Malignant neoplasm of prostate: Secondary | ICD-10-CM | POA: Diagnosis present

## 2019-09-09 DIAGNOSIS — R0689 Other abnormalities of breathing: Secondary | ICD-10-CM

## 2019-09-09 DIAGNOSIS — E11621 Type 2 diabetes mellitus with foot ulcer: Secondary | ICD-10-CM | POA: Diagnosis present

## 2019-09-09 DIAGNOSIS — E876 Hypokalemia: Secondary | ICD-10-CM

## 2019-09-09 DIAGNOSIS — Z20822 Contact with and (suspected) exposure to covid-19: Secondary | ICD-10-CM | POA: Diagnosis present

## 2019-09-09 DIAGNOSIS — Z515 Encounter for palliative care: Secondary | ICD-10-CM

## 2019-09-09 DIAGNOSIS — I872 Venous insufficiency (chronic) (peripheral): Secondary | ICD-10-CM | POA: Diagnosis present

## 2019-09-09 DIAGNOSIS — E440 Moderate protein-calorie malnutrition: Secondary | ICD-10-CM

## 2019-09-09 DIAGNOSIS — E669 Obesity, unspecified: Secondary | ICD-10-CM | POA: Diagnosis present

## 2019-09-09 DIAGNOSIS — E1169 Type 2 diabetes mellitus with other specified complication: Principal | ICD-10-CM | POA: Diagnosis present

## 2019-09-09 DIAGNOSIS — E875 Hyperkalemia: Secondary | ICD-10-CM

## 2019-09-09 DIAGNOSIS — Z8673 Personal history of transient ischemic attack (TIA), and cerebral infarction without residual deficits: Secondary | ICD-10-CM

## 2019-09-09 DIAGNOSIS — E11628 Type 2 diabetes mellitus with other skin complications: Secondary | ICD-10-CM

## 2019-09-09 DIAGNOSIS — L97529 Non-pressure chronic ulcer of other part of left foot with unspecified severity: Secondary | ICD-10-CM | POA: Diagnosis present

## 2019-09-09 DIAGNOSIS — F0150 Vascular dementia without behavioral disturbance: Secondary | ICD-10-CM

## 2019-09-09 DIAGNOSIS — Z86010 Personal history of colonic polyps: Secondary | ICD-10-CM

## 2019-09-09 DIAGNOSIS — E1165 Type 2 diabetes mellitus with hyperglycemia: Secondary | ICD-10-CM | POA: Diagnosis present

## 2019-09-09 DIAGNOSIS — I5033 Acute on chronic diastolic (congestive) heart failure: Secondary | ICD-10-CM | POA: Diagnosis present

## 2019-09-09 DIAGNOSIS — Z79899 Other long term (current) drug therapy: Secondary | ICD-10-CM

## 2019-09-09 DIAGNOSIS — I5032 Chronic diastolic (congestive) heart failure: Secondary | ICD-10-CM

## 2019-09-09 DIAGNOSIS — G9341 Metabolic encephalopathy: Secondary | ICD-10-CM | POA: Diagnosis not present

## 2019-09-09 DIAGNOSIS — N400 Enlarged prostate without lower urinary tract symptoms: Secondary | ICD-10-CM | POA: Diagnosis present

## 2019-09-09 DIAGNOSIS — G9340 Encephalopathy, unspecified: Secondary | ICD-10-CM

## 2019-09-09 DIAGNOSIS — I5042 Chronic combined systolic (congestive) and diastolic (congestive) heart failure: Secondary | ICD-10-CM | POA: Diagnosis present

## 2019-09-09 DIAGNOSIS — Z87891 Personal history of nicotine dependence: Secondary | ICD-10-CM

## 2019-09-09 DIAGNOSIS — M869 Osteomyelitis, unspecified: Secondary | ICD-10-CM | POA: Diagnosis not present

## 2019-09-09 DIAGNOSIS — M86172 Other acute osteomyelitis, left ankle and foot: Secondary | ICD-10-CM | POA: Diagnosis present

## 2019-09-09 DIAGNOSIS — E1152 Type 2 diabetes mellitus with diabetic peripheral angiopathy with gangrene: Secondary | ICD-10-CM | POA: Diagnosis present

## 2019-09-09 DIAGNOSIS — J9611 Chronic respiratory failure with hypoxia: Secondary | ICD-10-CM

## 2019-09-09 DIAGNOSIS — R413 Other amnesia: Secondary | ICD-10-CM | POA: Diagnosis present

## 2019-09-09 DIAGNOSIS — H409 Unspecified glaucoma: Secondary | ICD-10-CM | POA: Diagnosis present

## 2019-09-09 DIAGNOSIS — Z66 Do not resuscitate: Secondary | ICD-10-CM | POA: Diagnosis present

## 2019-09-09 DIAGNOSIS — N179 Acute kidney failure, unspecified: Secondary | ICD-10-CM

## 2019-09-09 DIAGNOSIS — I452 Bifascicular block: Secondary | ICD-10-CM | POA: Diagnosis present

## 2019-09-09 DIAGNOSIS — Z6837 Body mass index (BMI) 37.0-37.9, adult: Secondary | ICD-10-CM

## 2019-09-09 DIAGNOSIS — D631 Anemia in chronic kidney disease: Secondary | ICD-10-CM | POA: Diagnosis present

## 2019-09-09 DIAGNOSIS — Z794 Long term (current) use of insulin: Secondary | ICD-10-CM

## 2019-09-09 DIAGNOSIS — K5909 Other constipation: Secondary | ICD-10-CM | POA: Diagnosis present

## 2019-09-09 DIAGNOSIS — J90 Pleural effusion, not elsewhere classified: Secondary | ICD-10-CM

## 2019-09-09 DIAGNOSIS — N1831 Chronic kidney disease, stage 3a: Secondary | ICD-10-CM | POA: Diagnosis present

## 2019-09-09 DIAGNOSIS — K219 Gastro-esophageal reflux disease without esophagitis: Secondary | ICD-10-CM | POA: Diagnosis present

## 2019-09-09 DIAGNOSIS — Z7189 Other specified counseling: Secondary | ICD-10-CM

## 2019-09-09 DIAGNOSIS — N390 Urinary tract infection, site not specified: Secondary | ICD-10-CM

## 2019-09-09 DIAGNOSIS — E872 Acidosis: Secondary | ICD-10-CM | POA: Diagnosis present

## 2019-09-09 DIAGNOSIS — E1122 Type 2 diabetes mellitus with diabetic chronic kidney disease: Secondary | ICD-10-CM | POA: Diagnosis present

## 2019-09-09 DIAGNOSIS — L89613 Pressure ulcer of right heel, stage 3: Secondary | ICD-10-CM | POA: Diagnosis present

## 2019-09-09 DIAGNOSIS — I959 Hypotension, unspecified: Secondary | ICD-10-CM | POA: Diagnosis not present

## 2019-09-09 DIAGNOSIS — Z9889 Other specified postprocedural states: Secondary | ICD-10-CM

## 2019-09-09 DIAGNOSIS — Z7901 Long term (current) use of anticoagulants: Secondary | ICD-10-CM

## 2019-09-09 DIAGNOSIS — E11649 Type 2 diabetes mellitus with hypoglycemia without coma: Secondary | ICD-10-CM | POA: Diagnosis not present

## 2019-09-09 DIAGNOSIS — Z9981 Dependence on supplemental oxygen: Secondary | ICD-10-CM

## 2019-09-09 DIAGNOSIS — E1140 Type 2 diabetes mellitus with diabetic neuropathy, unspecified: Secondary | ICD-10-CM | POA: Diagnosis present

## 2019-09-09 DIAGNOSIS — H9190 Unspecified hearing loss, unspecified ear: Secondary | ICD-10-CM | POA: Diagnosis present

## 2019-09-09 DIAGNOSIS — I10 Essential (primary) hypertension: Secondary | ICD-10-CM | POA: Diagnosis present

## 2019-09-09 DIAGNOSIS — L089 Local infection of the skin and subcutaneous tissue, unspecified: Secondary | ICD-10-CM

## 2019-09-09 DIAGNOSIS — IMO0002 Reserved for concepts with insufficient information to code with codable children: Secondary | ICD-10-CM | POA: Diagnosis present

## 2019-09-09 DIAGNOSIS — E538 Deficiency of other specified B group vitamins: Secondary | ICD-10-CM | POA: Diagnosis present

## 2019-09-09 LAB — CBC WITH DIFFERENTIAL/PLATELET
Abs Immature Granulocytes: 0.06 10*3/uL (ref 0.00–0.07)
Basophils Absolute: 0 10*3/uL (ref 0.0–0.1)
Basophils Relative: 0 %
Eosinophils Absolute: 0.1 10*3/uL (ref 0.0–0.5)
Eosinophils Relative: 1 %
HCT: 42.1 % (ref 39.0–52.0)
Hemoglobin: 11.7 g/dL — ABNORMAL LOW (ref 13.0–17.0)
Immature Granulocytes: 1 %
Lymphocytes Relative: 8 %
Lymphs Abs: 0.8 10*3/uL (ref 0.7–4.0)
MCH: 24.7 pg — ABNORMAL LOW (ref 26.0–34.0)
MCHC: 27.8 g/dL — ABNORMAL LOW (ref 30.0–36.0)
MCV: 89 fL (ref 80.0–100.0)
Monocytes Absolute: 0.6 10*3/uL (ref 0.1–1.0)
Monocytes Relative: 7 %
Neutro Abs: 8.1 10*3/uL — ABNORMAL HIGH (ref 1.7–7.7)
Neutrophils Relative %: 83 %
Platelets: 255 10*3/uL (ref 150–400)
RBC: 4.73 MIL/uL (ref 4.22–5.81)
RDW: 17.6 % — ABNORMAL HIGH (ref 11.5–15.5)
WBC: 9.7 10*3/uL (ref 4.0–10.5)
nRBC: 0 % (ref 0.0–0.2)

## 2019-09-09 LAB — COMPREHENSIVE METABOLIC PANEL
ALT: 16 U/L (ref 0–44)
AST: 16 U/L (ref 15–41)
Albumin: 2.7 g/dL — ABNORMAL LOW (ref 3.5–5.0)
Alkaline Phosphatase: 102 U/L (ref 38–126)
Anion gap: 10 (ref 5–15)
BUN: 22 mg/dL (ref 8–23)
CO2: 34 mmol/L — ABNORMAL HIGH (ref 22–32)
Calcium: 8.5 mg/dL — ABNORMAL LOW (ref 8.9–10.3)
Chloride: 100 mmol/L (ref 98–111)
Creatinine, Ser: 1.26 mg/dL — ABNORMAL HIGH (ref 0.61–1.24)
GFR calc Af Amer: >60 mL/min (ref >60)
GFR calc non Af Amer: 55 mL/min — ABNORMAL LOW (ref >60)
Glucose, Bld: 180 mg/dL — ABNORMAL HIGH (ref 70–99)
Potassium: 4.2 mmol/L (ref 3.5–5.1)
Sodium: 144 mmol/L (ref 135–145)
Total Bilirubin: 1.1 mg/dL (ref 0.3–1.2)
Total Protein: 6 g/dL — ABNORMAL LOW (ref 6.5–8.1)

## 2019-09-09 LAB — C-REACTIVE PROTEIN: CRP: 3.4 mg/dL — ABNORMAL HIGH (ref <1.0)

## 2019-09-09 LAB — HEMOGLOBIN A1C
Hgb A1c MFr Bld: 6.8 % — ABNORMAL HIGH (ref 4.8–5.6)
Mean Plasma Glucose: 148.46 mg/dL

## 2019-09-09 LAB — SEDIMENTATION RATE: Sed Rate: 14 mm/hr (ref 0–16)

## 2019-09-09 LAB — CBG MONITORING, ED: Glucose-Capillary: 149 mg/dL — ABNORMAL HIGH (ref 70–99)

## 2019-09-09 MED ORDER — SODIUM CHLORIDE 0.9% FLUSH: 3.0000 mL | Freq: Once | INTRAVENOUS | Status: DC

## 2019-09-09 NOTE — ED Provider Notes (Signed)
[facility] EMERGENCY DEPARTMENT Provider Note   CSN: [id number] Arrival date & time: 09/09/19  1417     History Chief Complaint  Patient presents with  . Foot Pain    Darrell Mcclain is a 79 y.o. male.  Presents to ER with concern for left foot infection.  Patient states history of diabetes, long history of leg and foot pain.  Unsure how long he has had a wound on his heels.  Facility obtained a plain film yesterday which is concerning for possible osteomyelitis of left calcaneus and sent to ER for further eval.  Patient states he has no new complaints today, specifically he denies knowing of any new rash, no fever, no chest pain, no difficulty in breathing, no known medication changes.  HPI     Past Medical History:  Diagnosis Date  . Anticoagulant long-term use    xarelto  . Arthritis    right hand  . At risk for sleep apnea    STOP-BANG==  6      PT ALREADY SET UP TO HAVE STUDY AT [facility] , PCP AWARE  . Atrial flutter, chronic [facility]) dx 01/ 2015   cardiologist-  dr Angelena Form  . Bilateral lower extremity edema   . BPH (benign prostatic hyperplasia)   . Cerebral microvascular disease    advanced and multiple foci throughout hemispheres and brain stem-- residual memory issues  . Chronic constipation   . Chronic kidney disease (CKD), stage III (moderate)    nephrologist-  dr Marval Regal [facility])  . Diastolic CHF, chronic ([place])   . Exertional dyspnea   . Foley catheter in place   . GERD (gastroesophageal reflux disease)   . Glaucoma, both eyes   . History of adenomatous polyp of colon    2012  . History of CVA (cerebrovascular accident)    (pt had no symptoms other than memory issues) per MRI 11-29-2015  very small infarct right frontal lobe and multiple foci throughout hemispheres and brain stem  . 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 Hyperlipidemia   . Hypertension   . Iron deficiency anemia   . Memory loss   .  Prostate cancer ([place])   . RBBB (right bundle branch block with left anterior fascicular block)   . Rosacea   . Type 2 diabetes mellitus ([place])   . Urine retention   . Vitamin B 12 deficiency     Patient Active Problem List   Diagnosis Date Noted  . Malignant neoplasm of prostate ([place]) 09/10/2016  . Falls 11/21/2015  . Memory loss 11/21/2015  . Uncontrolled diabetes mellitus ([place]) 11/21/2015  . Morbid obesity ([place]) 11/21/2015  . Hyperlipidemia 11/21/2015  . Essential hypertension 11/21/2015  . 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 Chronic diastolic CHF (congestive heart failure), NYHA class 2 ([place]) 02/28/2014  . Atrial flutter ([place]) 10/13/2013    Past Surgical History:  Procedure Laterality Date  . CATARACT EXTRACTION W/ INTRAOCULAR LENS  IMPLANT, BILATERAL  2016  . IR THORACENTESIS ASP PLEURAL SPACE W/IMG GUIDE  12/01/2018  . ORIF PROXIMAL HUMEROUS FX AND BICEPS TENODESIS  11/09/2015  . OTHER SURGICAL HISTORY  1996   reconstruction left toe (amputation repair)  . PROSTATE BIOPSY N/A 07/22/2016   Procedure: BIOPSY TRANSRECTAL ULTRASONIC PROSTATE (TUBP);  Surgeon: Festus Aloe, MD;  Location: [facility];  Service: Urology;  Laterality: N/A;  . TONSILLECTOMY  1951  . TRANSTHORACIC ECHOCARDIOGRAM  08/23/2015  mild LVH, ef 55-60%, grade 1 diastolitc dysfunction  . WISDOM TOOTH EXTRACTION  1985       Family History  Problem Relation Age of Onset  . Cancer Sister        lung to brain/smoker  . Colon cancer Neg Hx   . Rectal cancer Neg Hx   . Stomach cancer Neg Hx   . CAD Neg Hx   . Dementia Neg Hx     Social History   Tobacco Use  . Smoking status: Former Smoker    Packs/day: 2.00    Years: 20.00    Pack years: 40.00    Quit date: 05/09/1983    Years since quitting: 36.3  . Smokeless tobacco: Never Used  Substance Use Topics  . Alcohol use: No  . Drug use: No    Home Medications Prior to Admission medications   Medication Sig  Start Date End Date Taking? Authorizing Provider  acetaminophen (TYLENOL) 500 MG tablet Take 1,000 mg by mouth every 6 (six) hours as needed for mild pain, moderate pain, fever or headache.    [provider]  amLODipine (NORVASC) 5 MG tablet Take by mouth.    [provider]  atorvastatin (LIPITOR) 20 MG tablet Take 1 tablet (20 mg total) by mouth daily. Patient taking differently: Take 20 mg by mouth every evening.  09/22/13   Burnell Blanks, MD  donepezil (ARICEPT) 10 MG tablet Take 1 tablet (10 mg total) by mouth at bedtime. 11/09/17   Melvenia Beam, MD  dorzolamide (TRUSOPT) 2 % ophthalmic solution Place 1 drop into both eyes 2 (two) times daily.  06/20/15   [provider]  finasteride (PROSCAR) 5 MG tablet Take 5 mg by mouth every evening.  05/16/16   [provider]  furosemide (LASIX) 40 MG tablet Take two (2) tablets (80 mg total) by mouth each morning and take one (1) tablet (40 mg total) by mouth each afternoon. Please make appt. 01/07/18   Burnell Blanks, MD  Hyoscyamine Sulfate SL (LEVSIN/SL) 0.125 MG SUBL Dissolve 1 tablet on the tongue at the first sign of diarrhea episodes, you may use 1 tablet every 6 hours as needed. 06/03/18   Esterwood, Amy S, PA-C  insulin aspart (NOVOLOG) 100 UNIT/ML injection Inject 6 Units into the skin 3 (three) times daily before meals.     [provider]  insulin glargine (LANTUS) 100 UNIT/ML injection Inject 40 Units into the skin at bedtime.     [provider]  lisinopril (PRINIVIL,ZESTRIL) 20 MG tablet Take 10 mg by mouth daily.     [provider]  metFORMIN (GLUCOPHAGE) 1000 MG tablet Take 1,000 mg by mouth 2 (two) times daily with a meal.    [provider]  metoprolol succinate (TOPROL-XL) 50 MG 24 hr tablet Take 1 tablet (50 mg total) by mouth 2 (two) times daily. Take with or immediately following a meal. Patient taking differently: Take 25 mg by mouth daily.  Take with or immediately following a meal. 04/04/15   Burnell Blanks, MD  omeprazole (PRILOSEC) 20 MG capsule Take 20 mg by mouth every morning.  04/26/11   [provider]  polyethylene glycol (MIRALAX / GLYCOLAX) packet Take 17 g by mouth daily.    [provider]  potassium chloride (MICRO-K) 10 MEQ CR capsule TK 1 C PO D 09/09/18   [provider]  PRESCRIPTION MEDICATION every 6 (six) weeks. Injections in eyes at dr's office  [provider]  rivaroxaban (XARELTO) 20 MG TABS tablet Take 1 tablet (20 mg total) by mouth daily with supper. 07/23/16   Festus Aloe, MD  tamsulosin (FLOMAX) 0.4 MG CAPS capsule Take 0.4 mg by mouth daily with breakfast.  01/09/14   [provider]    Allergies    Ciprofloxacin and Oxycodone  Review of Systems   Review of Systems  Constitutional: Negative for chills and fever.  HENT: Negative for ear pain and sore throat.   Eyes: Negative for pain and visual disturbance.  Respiratory: Negative for cough and shortness of breath.   Cardiovascular: Negative for chest pain and palpitations.  Gastrointestinal: Negative for abdominal pain and vomiting.  Genitourinary: Negative for dysuria and hematuria.  Musculoskeletal: Positive for arthralgias. Negative for back pain.  Skin: Negative for color change and rash.  Neurological: Negative for seizures and syncope.  All other systems reviewed and are negative.   Physical Exam Updated Vital Signs BP (!) 155/76   Pulse 62   Temp 97.9 F (36.6 C) (Oral)   Resp 17   SpO2 99%   Physical Exam Vitals and nursing note reviewed.  Constitutional:      Appearance: He is well-developed.  HENT:     Head: Normocephalic and atraumatic.  Eyes:     Conjunctiva/sclera: Conjunctivae normal.  Cardiovascular:     Rate and Rhythm: Normal rate and regular rhythm.     Heart sounds: No murmur.  Pulmonary:     Effort: Pulmonary effort is normal. No respiratory distress.      Breath sounds: Normal breath sounds.  Abdominal:     Palpations: Abdomen is soft.     Tenderness: There is no abdominal tenderness.  Musculoskeletal:     Cervical back: Neck supple.     Comments: LLE: 3cm dia ulcer at base of heel, fluctuant, foul smelling, no active ooze but noted purulence in bandage RLE: 1cm dia area of redness at base of heel, no fluctuance, no induration, no TTP  Skin:    General: Skin is warm and dry.  Neurological:     General: No focal deficit present.     Mental Status: He is alert and oriented to person, place, and time.   difficult to palpate DP/PT pulses but readily dopperlabe b/l  ED Results / Procedures / Treatments   Labs (all labs ordered are listed, but only abnormal results are displayed) Labs Reviewed  COMPREHENSIVE METABOLIC PANEL - Abnormal; Notable for the following components:      Result Value   CO2 34 (*)    Glucose, Bld 180 (*)    Creatinine, Ser 1.26 (*)    Calcium 8.5 (*)    Total Protein 6.0 (*)    Albumin 2.7 (*)    GFR calc non Af Amer 55 (*)    All other components within normal limits  CBC WITH DIFFERENTIAL/PLATELET - Abnormal; Notable for the following components:   Hemoglobin 11.7 (*)    MCH 24.7 (*)    MCHC 27.8 (*)    RDW 17.6 (*)    Neutro Abs 8.1 (*)    All other components within normal limits  C-REACTIVE PROTEIN - Abnormal; Notable for the following components:   CRP 3.4 (*)    All other components within normal limits  HEMOGLOBIN A1C - Abnormal; Notable for the following components:   Hgb A1c MFr Bld 6.8 (*)    All other components within normal limits  CBG MONITORING, ED - Abnormal; Notable for the  following components:   Glucose-Capillary 149 (*)    All other components within normal limits  CULTURE, BLOOD (ROUTINE X 2)  CULTURE, BLOOD (ROUTINE X 2)  SEDIMENTATION RATE    EKG None  Radiology DG Foot Complete Left  Result Date: 09/09/2019 CLINICAL DATA:  Left heel wound. Concern for osteomyelitis.  Recent left hip fracture. EXAM: LEFT FOOT - COMPLETE 3+ VIEW COMPARISON:  None. FINDINGS: Presumed surgical absence of the second phalanx. Vascular calcifications. Lucency superficial to the calcaneus, likely the site of soft tissue ulcer. No soft tissue gas. No radiopaque foreign object. No osseous destruction. Diffuse soft tissue swelling about the dorsal forefoot. IMPRESSION: No plain film evidence of osteomyelitis. Forefoot soft tissue swelling. Electronically Signed   By: Abigail Miyamoto M.D.   On: 09/09/2019 17:00    Procedures Procedures (including critical care time)  Medications Ordered in ED Medications  sodium chloride flush (NS) 0.9 % injection 3 mL (has no administration in time range)    ED Course  I have reviewed the triage vital signs and the nursing notes.  Pertinent labs & imaging results that were available during my care of the patient were reviewed by me and considered in my medical decision making (see chart for details).  Clinical Course as of Sep 08 2336  Fri Sep 09, 2019  1621 Complete initial assessment, will check inflammatory markers, plain film, try to obtain more history   [RD]  1715 D/w ex wife, emergency contact, attempted to call [facility], no answer at nursing station, left VM   [RD]  1956 D/w ortho, Veverly Fells, recommends MRI, if no frank abscess, likely they dont' need to be involved but will need referral to wound care   [RD]  2335 Signed out to [facility]   [RD]    Clinical Course User Index [RD] Lucrezia Starch, MD   MDM Rules/Calculators/A&P                       79 year old male presents to ER with chief complaint of concern for left heel wound.  Unfortunately not able to get a hold of facility to ascertain good timeline.  Suspect this may be chronic.  Discussed with orthopedic, recommended getting MRI to further evaluate.  While awaiting MRI results, patient signed out to Dr. Estevan Ryder.  If MRI negative for abscess or osteomyelitis, will plan to  treat for cellulitis with doxycycline.    Please see Dr. Rosalene Billings note for final plan and dispo.  Final Clinical Impression(s) / ED Diagnoses Final diagnoses:  Diabetic foot infection [facility])    Rx / DC Orders ED Discharge Orders    None       Lucrezia Starch, MD 09/09/19 2339

## 2019-09-09 NOTE — ED Notes (Signed)
[facility]. Unable to reach staff to get information regarding patient's chief complaint. Left voicemail

## 2019-09-09 NOTE — ED Notes (Signed)
Pt calls out frequently, asks to go home or for someone to sit in the room with him "because I am lonely." Pt pulls off o2 often, desats to 89% without 2L [place]. Encouraged pt to keep on monitor.

## 2019-09-09 NOTE — ED Notes (Signed)
Called [facility] for more information about pt wound and medications, was directed to a nursing line that went to voicemail, left message on machine requesting call back to this RN direct line.

## 2019-09-09 NOTE — ED Triage Notes (Signed)
Pt to ER by [facility] from facility for foot evaluation, he reports a wound is present and has been for 4-5 days, is currently dressed. Denies pain at this time. EMS reports poor circulation history. He is in NAD.

## 2019-09-10 ENCOUNTER — Encounter (HOSPITAL_COMMUNITY): Payer: Medicare Other

## 2019-09-10 ENCOUNTER — Emergency Department (HOSPITAL_COMMUNITY): Payer: Medicare Other

## 2019-09-10 ENCOUNTER — Other Ambulatory Visit: Payer: Self-pay

## 2019-09-10 ENCOUNTER — Inpatient Hospital Stay (HOSPITAL_COMMUNITY): Payer: Medicare Other

## 2019-09-10 DIAGNOSIS — L89620 Pressure ulcer of left heel, unstageable: Secondary | ICD-10-CM | POA: Diagnosis present

## 2019-09-10 DIAGNOSIS — N1831 Chronic kidney disease, stage 3a: Secondary | ICD-10-CM | POA: Diagnosis present

## 2019-09-10 DIAGNOSIS — I5032 Chronic diastolic (congestive) heart failure: Secondary | ICD-10-CM | POA: Diagnosis not present

## 2019-09-10 DIAGNOSIS — N400 Enlarged prostate without lower urinary tract symptoms: Secondary | ICD-10-CM | POA: Diagnosis present

## 2019-09-10 DIAGNOSIS — D631 Anemia in chronic kidney disease: Secondary | ICD-10-CM | POA: Diagnosis present

## 2019-09-10 DIAGNOSIS — G9341 Metabolic encephalopathy: Secondary | ICD-10-CM | POA: Diagnosis not present

## 2019-09-10 DIAGNOSIS — E1122 Type 2 diabetes mellitus with diabetic chronic kidney disease: Secondary | ICD-10-CM | POA: Diagnosis present

## 2019-09-10 DIAGNOSIS — E872 Acidosis: Secondary | ICD-10-CM | POA: Diagnosis present

## 2019-09-10 DIAGNOSIS — L089 Local infection of the skin and subcutaneous tissue, unspecified: Secondary | ICD-10-CM

## 2019-09-10 DIAGNOSIS — K219 Gastro-esophageal reflux disease without esophagitis: Secondary | ICD-10-CM | POA: Diagnosis present

## 2019-09-10 DIAGNOSIS — C61 Malignant neoplasm of prostate: Secondary | ICD-10-CM

## 2019-09-10 DIAGNOSIS — J9611 Chronic respiratory failure with hypoxia: Secondary | ICD-10-CM | POA: Diagnosis present

## 2019-09-10 DIAGNOSIS — J90 Pleural effusion, not elsewhere classified: Secondary | ICD-10-CM | POA: Diagnosis not present

## 2019-09-10 DIAGNOSIS — M86172 Other acute osteomyelitis, left ankle and foot: Secondary | ICD-10-CM | POA: Diagnosis present

## 2019-09-10 DIAGNOSIS — E440 Moderate protein-calorie malnutrition: Secondary | ICD-10-CM | POA: Diagnosis not present

## 2019-09-10 DIAGNOSIS — M869 Osteomyelitis, unspecified: Secondary | ICD-10-CM | POA: Diagnosis present

## 2019-09-10 DIAGNOSIS — I452 Bifascicular block: Secondary | ICD-10-CM | POA: Diagnosis present

## 2019-09-10 DIAGNOSIS — E08649 Diabetes mellitus due to underlying condition with hypoglycemia without coma: Secondary | ICD-10-CM

## 2019-09-10 DIAGNOSIS — Z515 Encounter for palliative care: Secondary | ICD-10-CM | POA: Diagnosis not present

## 2019-09-10 DIAGNOSIS — R413 Other amnesia: Secondary | ICD-10-CM | POA: Diagnosis not present

## 2019-09-10 DIAGNOSIS — I483 Typical atrial flutter: Secondary | ICD-10-CM | POA: Diagnosis not present

## 2019-09-10 DIAGNOSIS — I10 Essential (primary) hypertension: Secondary | ICD-10-CM | POA: Diagnosis not present

## 2019-09-10 DIAGNOSIS — E1169 Type 2 diabetes mellitus with other specified complication: Secondary | ICD-10-CM | POA: Diagnosis present

## 2019-09-10 DIAGNOSIS — N179 Acute kidney failure, unspecified: Secondary | ICD-10-CM | POA: Diagnosis not present

## 2019-09-10 DIAGNOSIS — H409 Unspecified glaucoma: Secondary | ICD-10-CM | POA: Diagnosis present

## 2019-09-10 DIAGNOSIS — E11628 Type 2 diabetes mellitus with other skin complications: Secondary | ICD-10-CM | POA: Diagnosis not present

## 2019-09-10 DIAGNOSIS — M86171 Other acute osteomyelitis, right ankle and foot: Secondary | ICD-10-CM | POA: Diagnosis not present

## 2019-09-10 DIAGNOSIS — Z66 Do not resuscitate: Secondary | ICD-10-CM | POA: Diagnosis present

## 2019-09-10 DIAGNOSIS — Z7189 Other specified counseling: Secondary | ICD-10-CM | POA: Diagnosis not present

## 2019-09-10 DIAGNOSIS — E11621 Type 2 diabetes mellitus with foot ulcer: Secondary | ICD-10-CM | POA: Diagnosis present

## 2019-09-10 DIAGNOSIS — I96 Gangrene, not elsewhere classified: Secondary | ICD-10-CM | POA: Diagnosis not present

## 2019-09-10 DIAGNOSIS — I130 Hypertensive heart and chronic kidney disease with heart failure and stage 1 through stage 4 chronic kidney disease, or unspecified chronic kidney disease: Secondary | ICD-10-CM | POA: Diagnosis present

## 2019-09-10 DIAGNOSIS — N390 Urinary tract infection, site not specified: Secondary | ICD-10-CM | POA: Diagnosis not present

## 2019-09-10 DIAGNOSIS — M86272 Subacute osteomyelitis, left ankle and foot: Secondary | ICD-10-CM | POA: Diagnosis not present

## 2019-09-10 DIAGNOSIS — F0150 Vascular dementia without behavioral disturbance: Secondary | ICD-10-CM | POA: Diagnosis not present

## 2019-09-10 DIAGNOSIS — L89613 Pressure ulcer of right heel, stage 3: Secondary | ICD-10-CM | POA: Diagnosis present

## 2019-09-10 DIAGNOSIS — Z20822 Contact with and (suspected) exposure to covid-19: Secondary | ICD-10-CM | POA: Diagnosis present

## 2019-09-10 DIAGNOSIS — K5909 Other constipation: Secondary | ICD-10-CM | POA: Diagnosis present

## 2019-09-10 DIAGNOSIS — I4892 Unspecified atrial flutter: Secondary | ICD-10-CM | POA: Diagnosis not present

## 2019-09-10 DIAGNOSIS — I5042 Chronic combined systolic (congestive) and diastolic (congestive) heart failure: Secondary | ICD-10-CM | POA: Diagnosis present

## 2019-09-10 DIAGNOSIS — E1152 Type 2 diabetes mellitus with diabetic peripheral angiopathy with gangrene: Secondary | ICD-10-CM | POA: Diagnosis present

## 2019-09-10 LAB — CBG MONITORING, ED
Glucose-Capillary: 52 mg/dL — ABNORMAL LOW (ref 70–99)
Glucose-Capillary: 90 mg/dL (ref 70–99)

## 2019-09-10 LAB — PREALBUMIN: Prealbumin: 12.4 mg/dL — ABNORMAL LOW (ref 18–38)

## 2019-09-10 LAB — SEDIMENTATION RATE: Sed Rate: 13 mm/hr (ref 0–16)

## 2019-09-10 LAB — GLUCOSE, CAPILLARY
Glucose-Capillary: 85 mg/dL (ref 70–99)
Glucose-Capillary: 98 mg/dL (ref 70–99)
Glucose-Capillary: 303 mg/dL — ABNORMAL HIGH (ref 70–99)

## 2019-09-10 LAB — MRSA PCR SCREENING: MRSA by PCR: NEGATIVE

## 2019-09-10 LAB — SARS CORONAVIRUS 2 (TAT 6-24 HRS): SARS Coronavirus 2: NEGATIVE

## 2019-09-10 MED ORDER — DEXTROSE 50 % IV SOLN
INTRAVENOUS | Status: AC
  Filled 2019-09-10: qty 50

## 2019-09-10 MED ORDER — FINASTERIDE 5 MG PO TABS
5.0000 mg | ORAL_TABLET | Freq: Every evening | ORAL | Status: DC
  Administered 2019-09-10 – 2019-09-22 (×12): 5 mg via ORAL
  Filled 2019-09-10 (×13): qty 1

## 2019-09-10 MED ORDER — INSULIN ASPART 100 UNIT/ML ~~LOC~~ SOLN
0.0000 [IU] | Freq: Every day | SUBCUTANEOUS | Status: DC
  Administered 2019-09-10: 23:00:00 4 [IU] via SUBCUTANEOUS

## 2019-09-10 MED ORDER — ONDANSETRON HCL 4 MG PO TABS
4.0000 mg | ORAL_TABLET | Freq: Four times a day (QID) | ORAL | Status: DC | PRN
  Filled 2019-09-10: qty 1

## 2019-09-10 MED ORDER — PANTOPRAZOLE SODIUM 40 MG PO TBEC
40.0000 mg | DELAYED_RELEASE_TABLET | Freq: Every day | ORAL | Status: DC
  Administered 2019-09-10 – 2019-09-22 (×13): 40 mg via ORAL
  Filled 2019-09-10 (×13): qty 1

## 2019-09-10 MED ORDER — INSULIN GLARGINE 100 UNIT/ML ~~LOC~~ SOLN
40.0000 [IU] | Freq: Every day | SUBCUTANEOUS | Status: DC
  Administered 2019-09-10 – 2019-09-12 (×3): 40 [IU] via SUBCUTANEOUS
  Filled 2019-09-10 (×5): qty 0.4

## 2019-09-10 MED ORDER — LISINOPRIL 10 MG PO TABS
10.0000 mg | ORAL_TABLET | Freq: Every day | ORAL | Status: DC
  Administered 2019-09-10 – 2019-09-14 (×4): 10 mg via ORAL
  Filled 2019-09-10 (×5): qty 1

## 2019-09-10 MED ORDER — FUROSEMIDE 40 MG PO TABS
80.0000 mg | ORAL_TABLET | Freq: Every day | ORAL | Status: DC
  Administered 2019-09-11 – 2019-09-13 (×3): 80 mg via ORAL
  Filled 2019-09-10 (×5): qty 2

## 2019-09-10 MED ORDER — GADOBUTROL 1 MMOL/ML IV SOLN
10.0000 mL | Freq: Once | INTRAVENOUS | Status: AC | PRN
  Administered 2019-09-10: 10 mL via INTRAVENOUS

## 2019-09-10 MED ORDER — POLYETHYLENE GLYCOL 3350 17 G PO PACK: 17.0000 g | PACK | Freq: Every day | ORAL | Status: DC

## 2019-09-10 MED ORDER — VANCOMYCIN HCL 2000 MG/400ML IV SOLN
2000.0000 mg | Freq: Once | INTRAVENOUS | Status: AC
  Administered 2019-09-10: 12:00:00 2000 mg via INTRAVENOUS
  Filled 2019-09-10 (×2): qty 400

## 2019-09-10 MED ORDER — ACETAMINOPHEN 325 MG PO TABS
650.0000 mg | ORAL_TABLET | Freq: Four times a day (QID) | ORAL | Status: DC | PRN
  Administered 2019-09-11 – 2019-09-14 (×4): 650 mg via ORAL
  Filled 2019-09-10 (×4): qty 2

## 2019-09-10 MED ORDER — DOCUSATE SODIUM 100 MG PO CAPS
100.0000 mg | ORAL_CAPSULE | Freq: Two times a day (BID) | ORAL | Status: DC
  Administered 2019-09-10 – 2019-09-13 (×7): 100 mg via ORAL
  Filled 2019-09-10 (×11): qty 1

## 2019-09-10 MED ORDER — ONDANSETRON HCL 4 MG/2ML IJ SOLN
4.0000 mg | Freq: Four times a day (QID) | INTRAMUSCULAR | Status: DC | PRN
  Administered 2019-09-11: 4 mg via INTRAVENOUS
  Filled 2019-09-10 (×2): qty 2

## 2019-09-10 MED ORDER — DONEPEZIL HCL 5 MG PO TABS
10.0000 mg | ORAL_TABLET | Freq: Every day | ORAL | Status: DC
  Administered 2019-09-10 – 2019-09-22 (×11): 10 mg via ORAL
  Filled 2019-09-10: qty 2
  Filled 2019-09-10 (×3): qty 1
  Filled 2019-09-10 (×2): qty 2
  Filled 2019-09-10: qty 1
  Filled 2019-09-10: qty 2
  Filled 2019-09-10 (×3): qty 1
  Filled 2019-09-10: qty 2

## 2019-09-10 MED ORDER — VANCOMYCIN HCL 1250 MG/250ML IV SOLN
1250.0000 mg | INTRAVENOUS | Status: DC
  Filled 2019-09-10: qty 250

## 2019-09-10 MED ORDER — DEXTROSE 50 % IV SOLN
1.0000 | Freq: Once | INTRAVENOUS | Status: AC
  Administered 2019-09-10: 50 mL via INTRAVENOUS

## 2019-09-10 MED ORDER — JUVEN PO PACK
1.0000 | PACK | Freq: Two times a day (BID) | ORAL | Status: DC
  Administered 2019-09-10 – 2019-09-22 (×22): 1 via ORAL
  Filled 2019-09-10 (×22): qty 1

## 2019-09-10 MED ORDER — DORZOLAMIDE HCL 2 % OP SOLN
1.0000 [drp] | Freq: Two times a day (BID) | OPHTHALMIC | Status: DC
  Administered 2019-09-10 – 2019-09-22 (×24): 1 [drp] via OPHTHALMIC
  Filled 2019-09-10 (×2): qty 10

## 2019-09-10 MED ORDER — METOPROLOL SUCCINATE ER 25 MG PO TB24
25.0000 mg | ORAL_TABLET | Freq: Every day | ORAL | Status: DC
  Administered 2019-09-10 – 2019-09-14 (×6): 25 mg via ORAL
  Filled 2019-09-10 (×8): qty 1

## 2019-09-10 MED ORDER — METRONIDAZOLE IN NACL 5-0.79 MG/ML-% IV SOLN
500.0000 mg | Freq: Three times a day (TID) | INTRAVENOUS | Status: DC
  Administered 2019-09-10 – 2019-09-16 (×17): 500 mg via INTRAVENOUS
  Filled 2019-09-10 (×17): qty 100

## 2019-09-10 MED ORDER — GADOBUTROL 1 MMOL/ML IV SOLN
10.0000 mL | Freq: Once | INTRAVENOUS | Status: AC | PRN
  Administered 2019-09-10: 17:00:00 10 mL via INTRAVENOUS

## 2019-09-10 MED ORDER — TAMSULOSIN HCL 0.4 MG PO CAPS
0.4000 mg | ORAL_CAPSULE | Freq: Every day | ORAL | Status: DC
  Administered 2019-09-10 – 2019-09-22 (×13): 0.4 mg via ORAL
  Filled 2019-09-10 (×13): qty 1

## 2019-09-10 MED ORDER — FUROSEMIDE 20 MG PO TABS: 40.0000 mg | ORAL_TABLET | ORAL | Status: DC

## 2019-09-10 MED ORDER — FUROSEMIDE 40 MG PO TABS
40.0000 mg | ORAL_TABLET | Freq: Every day | ORAL | Status: DC
  Administered 2019-09-10 – 2019-09-14 (×5): 40 mg via ORAL
  Filled 2019-09-10 (×5): qty 1

## 2019-09-10 MED ORDER — PIPERACILLIN-TAZOBACTAM 3.375 G IVPB
3.3750 g | Freq: Three times a day (TID) | INTRAVENOUS | Status: DC
  Administered 2019-09-10: 05:00:00 3.375 g via INTRAVENOUS
  Filled 2019-09-10: qty 50

## 2019-09-10 MED ORDER — ATORVASTATIN CALCIUM 10 MG PO TABS
20.0000 mg | ORAL_TABLET | Freq: Every evening | ORAL | Status: DC
  Administered 2019-09-10 – 2019-09-22 (×12): 20 mg via ORAL
  Filled 2019-09-10 (×13): qty 2

## 2019-09-10 MED ORDER — SODIUM CHLORIDE 0.9 % IV SOLN
2.0000 g | INTRAVENOUS | Status: DC
  Administered 2019-09-10 – 2019-09-15 (×5): 2 g via INTRAVENOUS
  Filled 2019-09-10 (×5): qty 20

## 2019-09-10 MED ORDER — AMLODIPINE BESYLATE 5 MG PO TABS
5.0000 mg | ORAL_TABLET | Freq: Every day | ORAL | Status: DC
  Administered 2019-09-10 – 2019-09-15 (×5): 5 mg via ORAL
  Filled 2019-09-10 (×6): qty 1

## 2019-09-10 MED ORDER — GLUCERNA SHAKE PO LIQD
237.0000 mL | Freq: Three times a day (TID) | ORAL | Status: DC
  Administered 2019-09-10 – 2019-09-13 (×10): 237 mL via ORAL

## 2019-09-10 MED ORDER — RIVAROXABAN 20 MG PO TABS
20.0000 mg | ORAL_TABLET | Freq: Every day | ORAL | Status: DC
  Administered 2019-09-10: 20 mg via ORAL
  Filled 2019-09-10: qty 1

## 2019-09-10 MED ORDER — ACETAMINOPHEN 650 MG RE SUPP: 650.0000 mg | Freq: Four times a day (QID) | RECTAL | Status: DC | PRN

## 2019-09-10 MED ORDER — PROSIGHT PO TABS
1.0000 | ORAL_TABLET | Freq: Every day | ORAL | Status: DC
  Administered 2019-09-10 – 2019-09-22 (×13): 1 via ORAL
  Filled 2019-09-10 (×13): qty 1

## 2019-09-10 MED ORDER — INSULIN ASPART 100 UNIT/ML ~~LOC~~ SOLN: 0.0000 [IU] | Freq: Three times a day (TID) | SUBCUTANEOUS | Status: DC

## 2019-09-10 MED ORDER — INSULIN ASPART 100 UNIT/ML ~~LOC~~ SOLN
6.0000 [IU] | Freq: Three times a day (TID) | SUBCUTANEOUS | Status: DC
  Administered 2019-09-11 – 2019-09-12 (×3): 6 [IU] via SUBCUTANEOUS

## 2019-09-10 NOTE — Progress Notes (Signed)
RN spoke with pt's daughter, Colletta [place], and updated her.

## 2019-09-10 NOTE — Plan of Care (Signed)
  Problem: Education: Goal: Knowledge of General Education information will improve Description: Including pain rating scale, medication(s)/side effects and non-pharmacologic comfort measures Outcome: Progressing   Problem: Health Behavior/Discharge Planning: Goal: Ability to manage health-related needs will improve Outcome: Progressing   Problem: Clinical Measurements: Goal: Ability to maintain clinical measurements within normal limits will improve Outcome: Progressing   Problem: Activity: Goal: Risk for activity intolerance will decrease Outcome: Progressing   Problem: Coping: Goal: Level of anxiety will decrease Outcome: Progressing   Problem: Safety: Goal: Ability to remain free from injury will improve Outcome: Progressing

## 2019-09-10 NOTE — Progress Notes (Signed)
Initial Nutrition Assessment  DOCUMENTATION CODES:   Obesity unspecified  INTERVENTION:  -Glucerna Shake po TID, each supplement provides 220 kcal and 10 grams of protein  -1 packet Juven BID, each packet provides 95 calories, 2.5 grams of protein (collagen), and 9.8 grams of carbohydrate (3 grams sugar); also contains 7 grams of L-arginine and L-glutamine, 300 mg vitamin C, 15 mg vitamin E, 1.2 mcg vitamin B-12, 9.5 mg zinc, 200 mg calcium, and 1.5 g  Calcium Beta-hydroxy-Beta-methylbutyrate to support wound healing  -Ocuvite-lutein daily, provides 200 mg vit C, 40 mg zinc, 55 mcg selenium, 2 mg copper, 2 mg luetin to support wound healing  NUTRITION DIAGNOSIS:   Increased nutrient needs related to wound healing as evidenced by estimated needs.    GOAL:   Patient will meet greater than or equal to 90% of their needs    MONITOR:   PO intake, Weight trends, Supplement acceptance, Skin, I & O's, Labs  REASON FOR ASSESSMENT:   Consult Wound healing  ASSESSMENT:  RD working remotely.  79 year old male with past medical history of T2DM, prostate cancer, vascular dementia, HTN, HLD, h/o CVA, BPH with h/o chronic indwelling foley (not present on admission), chronic diastolic CHF, CKD3, chronic respiratory failure on home O2, and afib who presented with left foot ulceration and admitted for osteomyelitis of left foot.  Unable to obtain nutrition history at this time, patient in diagnostic radiology per chart review. Patient resides at facility and noted to be a poor historian. No meals documented at this time for review. Will continue to monitor for po intake and provide Glucerna to aid with calorie and protein needs as well as Juven to support wound healing.   Per chart review, arterial doppler &ABIs pending. Patient will require surgical intervention and most likely a transtibial amputation; wound care for the right heel. Ortho to follow-up on Monday to determine timing for  surgical intervention.  Current wt 95 kg (209 lbs) +2 BLE edema per review of RN flowsheet No recent weight history for review. Patient UBW 108-109.5 kg in 2018-2019  Medications reviewed and include: SS novolog, Lantus 40 units at bedtime  Labs: CBGs 85-90   NUTRITION - FOCUSED PHYSICAL EXAM: Unable to complete at this time, RD working remotely.    Diet Order:   Diet Order            Diet heart healthy/carb modified Room service appropriate? Yes; Fluid consistency: Thin  Diet effective now              EDUCATION NEEDS:   Not appropriate for education at this time  Skin:  Skin Assessment: Reviewed RN Assessment(unstageable;right heel; DPI; left heel)  Last BM:  1/01  Height:   Ht Readings from Last 1 Encounters:  09/10/19 5\' 7"  (1.702 m)    Weight:   Wt Readings from Last 1 Encounters:  09/10/19 95 kg    Ideal Body Weight:  67.3 kg  BMI:  Body mass index is 32.8 kg/m.  Estimated Nutritional Needs:   Kcal:  2000-2200  Protein:  114-133  Fluid:  >/= 2 L/day   Lajuan Lines, RD, LDN Clinical Nutrition [place] Telephone [phone] After Hours/Weekend Pager: [phone]

## 2019-09-10 NOTE — ED Provider Notes (Signed)
Patient signed out to me by Dr. Roslynn Amble to follow-up MRI.  Patient seen for wound on his left heel.  Preliminary MRI report was called to me.  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  Based on this finding, patient will require hospitalization for initiation of IV antibiotics.   Orpah Greek, MD 09/10/19 [phone]

## 2019-09-10 NOTE — ED Notes (Signed)
Message sent to pharmacy to send vanc

## 2019-09-10 NOTE — ED Triage Notes (Signed)
This Probation officer spoke with Pt's daughter with update.

## 2019-09-10 NOTE — Consult Note (Signed)
Reason for Consult:L heel osteo Referring Physician: Dr. Roslynn Amble (ED)  Darrell Mcclain Surgeon is an 79 y.o. male.  HPI: Poor historian, poor timeline. Pt resting and hx obtained from records. Hx DM, apparent long hx of foot pain. Pt resides in a facility. Wound noted on heel, xrays obtained which were concerning for osteo. Denies fever or chills. Resting comfortably at time of my exam.  Past Medical History:  Diagnosis Date  . Anticoagulant long-term use    xarelto  . Arthritis    right hand  . At risk for sleep apnea    STOP-BANG==  6      PT ALREADY SET UP TO HAVE STUDY AT [facility] , PCP AWARE  . Atrial flutter, chronic [facility]) dx 01/ 2015   cardiologist-  dr Angelena Form  . Bilateral lower extremity edema   . BPH (benign prostatic hyperplasia)   . Cerebral microvascular disease    advanced and multiple foci throughout hemispheres and brain stem-- residual memory issues  . Chronic constipation   . Chronic kidney disease (CKD), stage III (moderate)    nephrologist-  dr Marval Regal [facility])  . Diastolic CHF, chronic ([place])   . Exertional dyspnea   . Foley catheter in place   . GERD (gastroesophageal reflux disease)   . Glaucoma, both eyes   . History of adenomatous polyp of colon    2012  . History of CVA (cerebrovascular accident)    (pt had no symptoms other than memory issues) per MRI 11-29-2015  very small infarct right frontal lobe and multiple foci throughout hemispheres and brain stem  . 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 Hyperlipidemia   . Hypertension   . Iron deficiency anemia   . Memory loss   . Prostate cancer ([place])   . RBBB (right bundle branch block with left anterior fascicular block)   . Rosacea   . Type 2 diabetes mellitus ([place])   . Urine retention   . Vitamin B 12 deficiency     Past Surgical History:  Procedure Laterality Date  . CATARACT EXTRACTION W/ INTRAOCULAR LENS  IMPLANT, BILATERAL  2016  . IR THORACENTESIS ASP PLEURAL  SPACE W/IMG GUIDE  12/01/2018  . ORIF PROXIMAL HUMEROUS FX AND BICEPS TENODESIS  11/09/2015  . OTHER SURGICAL HISTORY  1996   reconstruction left toe (amputation repair)  . PROSTATE BIOPSY N/A 07/22/2016   Procedure: BIOPSY TRANSRECTAL ULTRASONIC PROSTATE (TUBP);  Surgeon: Festus Aloe, MD;  Location: [facility];  Service: Urology;  Laterality: N/A;  . TONSILLECTOMY  1951  . TRANSTHORACIC ECHOCARDIOGRAM  08/23/2015   mild LVH, ef 55-60%, grade 1 diastolitc dysfunction  . WISDOM TOOTH EXTRACTION  1985    Family History  Problem Relation Age of Onset  . Cancer Sister        lung to brain/smoker  . Colon cancer Neg Hx   . Rectal cancer Neg Hx   . Stomach cancer Neg Hx   . CAD Neg Hx   . Dementia Neg Hx     Social History:  reports that he quit smoking about 36 years ago. He has a 40.00 pack-year smoking history. He has never used smokeless tobacco. He reports that he does not drink alcohol or use drugs.  Allergies:  Allergies  Allergen Reactions  . Ciprofloxacin Diarrhea  . Oxycodone Other (See Comments)    "confusion and loopy"    Medications: I have reviewed the patient's current medications.  Results  for orders placed or performed during the hospital encounter of 09/09/19 (from the past 48 hour(s))  Comprehensive metabolic panel     Status: Abnormal   Collection Time: 09/09/19  3:23 PM  Result Value Ref Range   Sodium 144 135 - 145 mmol/L   Potassium 4.2 3.5 - 5.1 mmol/L   Chloride 100 98 - 111 mmol/L   CO2 34 (H) 22 - 32 mmol/L   Glucose, Bld 180 (H) 70 - 99 mg/dL   BUN 22 8 - 23 mg/dL   Creatinine, Ser 1.26 (H) 0.61 - 1.24 mg/dL   Calcium 8.5 (L) 8.9 - 10.3 mg/dL   Total Protein 6.0 (L) 6.5 - 8.1 g/dL   Albumin 2.7 (L) 3.5 - 5.0 g/dL   AST 16 15 - 41 U/L   ALT 16 0 - 44 U/L   Alkaline Phosphatase 102 38 - 126 U/L   Total Bilirubin 1.1 0.3 - 1.2 mg/dL   GFR calc non Af Amer 55 (L) >60 mL/min   GFR calc Af Amer >60 >60 mL/min   Anion gap 10 5  - 15    Comment: Performed at [facility], [address], [place], [place] [address]  CBC with Differential     Status: Abnormal   Collection Time: 09/09/19  3:23 PM  Result Value Ref Range   WBC 9.7 4.0 - 10.5 K/uL   RBC 4.73 4.22 - 5.81 MIL/uL   Hemoglobin 11.7 (L) 13.0 - 17.0 g/dL   HCT 42.1 39.0 - 52.0 %   MCV 89.0 80.0 - 100.0 fL   MCH 24.7 (L) 26.0 - 34.0 pg   MCHC 27.8 (L) 30.0 - 36.0 g/dL   RDW 17.6 (H) 11.5 - 15.5 %   Platelets 255 150 - 400 K/uL   nRBC 0.0 0.0 - 0.2 %   Neutrophils Relative % 83 %   Neutro Abs 8.1 (H) 1.7 - 7.7 K/uL   Lymphocytes Relative 8 %   Lymphs Abs 0.8 0.7 - 4.0 K/uL   Monocytes Relative 7 %   Monocytes Absolute 0.6 0.1 - 1.0 K/uL   Eosinophils Relative 1 %   Eosinophils Absolute 0.1 0.0 - 0.5 K/uL   Basophils Relative 0 %   Basophils Absolute 0.0 0.0 - 0.1 K/uL   Immature Granulocytes 1 %   Abs Immature Granulocytes 0.06 0.00 - 0.07 K/uL    Comment: Performed at [facility], [place] [address], [place], [place] [address]  CBG monitoring, ED     Status: Abnormal   Collection Time: 09/09/19  3:31 PM  Result Value Ref Range   Glucose-Capillary 149 (H) 70 - 99 mg/dL   Comment 1 Notify RN    Comment 2 Document in Chart   Blood culture (routine x 2)     Status: None (Preliminary result)   Collection Time: 09/09/19  3:48 PM   Specimen: BLOOD  Result Value Ref Range   Specimen Description BLOOD RIGHT ANTECUBITAL    Special Requests      BOTTLES DRAWN AEROBIC AND ANAEROBIC Blood Culture adequate volume   Culture      NO GROWTH < 24 HOURS Performed at [facility], [place] [address], [place], [place] [address]    Report Status PENDING   Sedimentation rate     Status: None   Collection Time: 09/09/19  4:05 PM  Result Value Ref Range   Sed Rate 14 0 - 16 mm/hr    Comment: Performed at [facility], [place] [address] [place], [place] [address]  C-reactive protein     Status: Abnormal   Collection Time: 09/09/19  4:05 PM   Result Value Ref Range   CRP 3.4 (H) <1.0 mg/dL    Comment: Performed at [facility] [address], [place], [place] [address]  Hemoglobin A1c     Status: Abnormal   Collection Time: 09/09/19  4:17 PM  Result Value Ref Range   Hgb A1c MFr Bld 6.8 (H) 4.8 - 5.6 %    Comment: (NOTE) Pre diabetes:          5.7%-6.4% Diabetes:              >6.4% Glycemic control for   <7.0% adults with diabetes    Mean Plasma Glucose 148.46 mg/dL    Comment: Performed at [facility] [address] [place], [place] [address]  Blood culture (routine x 2)     Status: None (Preliminary result)   Collection Time: 09/09/19  4:37 PM   Specimen: BLOOD  Result Value Ref Range   Specimen Description BLOOD LEFT ANTECUBITAL    Special Requests      BOTTLES DRAWN AEROBIC AND ANAEROBIC Blood Culture results may not be optimal due to an inadequate volume of blood received in culture bottles   Culture      NO GROWTH < 24 HOURS Performed at [facility] [address], [place], [place] [address]    Report Status PENDING   SARS CORONAVIRUS 2 (TAT 6-24 HRS) Nasopharyngeal Nasopharyngeal Swab     Status: None   Collection Time: 09/10/19  3:55 AM   Specimen: Nasopharyngeal Swab  Result Value Ref Range   SARS Coronavirus 2 NEGATIVE NEGATIVE    Comment: (NOTE) SARS-CoV-2 target nucleic acids are NOT DETECTED. The SARS-CoV-2 RNA is generally detectable in upper and lower respiratory specimens during the acute phase of infection. Negative results do not preclude SARS-CoV-2 infection, do not rule out co-infections with other pathogens, and should not be used as the sole basis for treatment or other patient management decisions. Negative results must be combined with clinical observations, patient history, and epidemiological information. The expected result is Negative. Fact Sheet for Patients: SugarRoll.be Fact Sheet for Healthcare  Providers: https://www.woods-mathews.com/ This test is not yet approved or cleared by the [place] FDA and  has been authorized for detection and/or diagnosis of SARS-CoV-2 by FDA under an Emergency Use Authorization (EUA). This EUA will remain  in effect (meaning this test can be used) for the duration of the COVID-19 declaration under Section 56 4(b)(1) of the Act, 21 U.S.C. section 360bbb-3(b)(1), unless the authorization is terminated or revoked sooner. Performed at [facility], [place] [address] [place], [place] [address]   POC CBG, ED     Status: Abnormal   Collection Time: 09/10/19  8:20 AM  Result Value Ref Range   Glucose-Capillary 52 (L) 70 - 99 mg/dL   Comment 1 Notify RN    Comment 2 Document in Chart   POC CBG, ED     Status: None   Collection Time: 09/10/19  9:05 AM  Result Value Ref Range   Glucose-Capillary 90 70 - 99 mg/dL    DG Orbits  Result Date: 09/10/2019 CLINICAL DATA:  Initial evaluation for MRI clearance. EXAM: ORBITS - COMPLETE 4+ VIEW COMPARISON:  None. FINDINGS: There is no evidence of fracture or other significant bone abnormality. No orbital emphysema or sinus air-fluid  levels are seen. IMPRESSION: Negative.  No metallic foreign body seen overlying either orbit. Electronically Signed   By: Jeannine Boga M.D.   On: 09/10/2019 00:47   MR HEEL LEFT W WO CONTRAST  Result Date: 09/10/2019 CLINICAL DATA:  Left heel wound, diabetes EXAM: MRI OF LOWER LEFT EXTREMITY WITHOUT AND WITH CONTRAST TECHNIQUE: Multiplanar, multisequence MR imaging of the left hindfoot was performed both before and after administration of intravenous contrast. CONTRAST:  69mL GADAVIST GADOBUTROL 1 MMOL/ML IV SOLN COMPARISON:  X-ray 09/09/2019 FINDINGS: Bones/Joint/Cartilage Loss of cortical definition of the posterolateral aspect of the left calcaneus with extensive bone marrow edema and enhancement. There is confluent low T1 signal compatible with acute  osteomyelitis (series 6, images 24-28; series 12, image 14). The abnormal marrow signal is confined to the posterolateral calcaneus and measures up to 1 cm in AP depth with a thin amount of abnormal signal extending along the lateral aspect of the posterior calcaneus closely approximating the peroneal tubercle (series 4, image 24). There is involvement of the lateral most aspect of the distal Achilles tendon insertion (series 4, image 23) as well as the attachment of the plantar fascia (series 13, image 14). The remaining marrow signal of the calcaneal body is preserved. Remaining marrow structures of the hindfoot are intact. There are no fractures. Scattered degenerative changes involving the midfoot and subtalar joints. No talar dome chondral defect. No tibiotalar or subtalar joint effusion. Ligaments Medial and lateral ankle ligaments are grossly intact. Muscles and Tendons Diffuse intramuscular edema which may reflect denervation changes versus myositis. Achilles tendon intact. Peroneal tendons, posteromedial ankle tendons, and anterior ankle tendons are intact. Plantar fascia remains intact. Soft tissues Large soft tissue ulceration overlying the posterolateral aspect of the calcaneus closely approximating the underlying bone. There is poorly enhancing tissue in this region compatible with tissue necrosis (series 19, image 17). Marked circumferential subcutaneous edema without a well-defined fluid collection. IMPRESSION: 1. 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 Abnormal marrow signal involves the lateral most aspect of the distal Achilles tendon insertion as well as the attachment of the plantar fascia. 2. Marked circumferential subcutaneous edema without a well-defined fluid collection. 3. Diffuse intramuscular edema which may reflect denervation changes versus myositis. Electronically Signed   By:  Davina Poke D.O.   On: 09/10/2019 09:13   DG Foot Complete Left  Result Date: 09/09/2019 CLINICAL DATA:  Left heel wound. Concern for osteomyelitis. Recent left hip fracture. EXAM: LEFT FOOT - COMPLETE 3+ VIEW COMPARISON:  None. FINDINGS: Presumed surgical absence of the second phalanx. Vascular calcifications. Lucency superficial to the calcaneus, likely the site of soft tissue ulcer. No soft tissue gas. No radiopaque foreign object. No osseous destruction. Diffuse soft tissue swelling about the dorsal forefoot. IMPRESSION: No plain film evidence of osteomyelitis. Forefoot soft tissue swelling. Electronically Signed   By: Abigail Miyamoto M.D.   On: 09/09/2019 17:00    Review of Systems  Constitutional: Positive for fatigue.  HENT: Negative.   Respiratory: Positive for cough.   Cardiovascular: Negative.   Gastrointestinal: Negative.   Musculoskeletal: Positive for arthralgias.   Blood pressure (!) 156/82, pulse 76, temperature 98.2 F (36.8 C), temperature source Oral, resp. rate 18, weight 95 kg, SpO2 97 %. Physical Exam  Constitutional: He is oriented to person, place, and time.  HENT:  Head: Normocephalic.  Eyes: Pupils are equal, round, and reactive to light.  Cardiovascular: Normal rate.  Respiratory: Effort normal.  GI: Soft.  Musculoskeletal:     Cervical back: Normal range of motion.     Comments: L heel: 3cm dia ulcer at base of heel, fluctuant, foul smelling, no active ooze but noted purulence in bandage RLE: 1cm dia area of redness at base of heel, no fluctuance, no induration, nontender  Unable to palpate DP/PT pulses B/L  Neurological: He is alert and oriented to person, place, and time.    Assessment/Plan: Admitted to medical service IV abx Consult Dr. [name] to eval for possible amputation Arterial doppler & ABIs (appears to have already been ordered) for eval Discussed with Dr. Billy Coast Deatra Robinson PA-C 09/10/2019, 10:06 AM

## 2019-09-10 NOTE — ED Triage Notes (Signed)
CBG 90 following D50  1amp

## 2019-09-10 NOTE — ED Notes (Signed)
Pt CBG 52. Notified Luellen Pucker, Therapist, sports.

## 2019-09-10 NOTE — ED Notes (Signed)
Message sent to pharmacy to send pharmacy

## 2019-09-10 NOTE — ED Notes (Signed)
Patient transported to MRI 

## 2019-09-10 NOTE — H&P (View-Only) (Signed)
ORTHOPAEDIC CONSULTATION  REQUESTING PHYSICIAN: Karmen Bongo, MD  Chief Complaint: Gangrenous ulcer left heel with osteomyelitis of the calcaneus and superficial ulcer right heel.  HPI: Darrell Mcclain is a 79 y.o. male who presents with gangrenous ulcer and osteomyelitis left calcaneus with superficial ulcer on the right.  Patient has diabetic insensate neuropathy peripheral vascular disease and protein caloric malnutrition.  Past Medical History:  Diagnosis Date  . Anticoagulant long-term use    xarelto  . Arthritis    right hand  . At risk for sleep apnea    STOP-BANG==  6      PT ALREADY SET UP TO HAVE STUDY AT [facility] , PCP AWARE  . Atrial flutter, chronic [facility]) dx 01/ 2015   cardiologist-  dr Angelena Form  . Bilateral lower extremity edema   . BPH (benign prostatic hyperplasia)   . Cerebral microvascular disease    advanced and multiple foci throughout hemispheres and brain stem-- residual memory issues  . Chronic constipation   . Chronic kidney disease (CKD), stage III (moderate)    nephrologist-  dr Marval Regal [facility])  . Diastolic CHF, chronic ([place])   . Exertional dyspnea   . Foley catheter in place   . GERD (gastroesophageal reflux disease)   . Glaucoma, both eyes   . History of adenomatous polyp of colon    2012  . History of CVA (cerebrovascular accident)    (pt had no symptoms other than memory issues) per MRI 11-29-2015  very small infarct right frontal lobe and multiple foci throughout hemispheres and brain stem  . 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 Hyperlipidemia   . Hypertension   . Iron deficiency anemia   . Memory loss   . Prostate cancer ([place])   . RBBB (right bundle branch block with left anterior fascicular block)   . Rosacea   . Type 2 diabetes mellitus ([place])   . Urine retention   . Vitamin B 12 deficiency    Past Surgical History:  Procedure Laterality Date  . CATARACT EXTRACTION W/ INTRAOCULAR LENS   IMPLANT, BILATERAL  2016  . IR THORACENTESIS ASP PLEURAL SPACE W/IMG GUIDE  12/01/2018  . ORIF PROXIMAL HUMEROUS FX AND BICEPS TENODESIS  11/09/2015  . OTHER SURGICAL HISTORY  1996   reconstruction left toe (amputation repair)  . PROSTATE BIOPSY N/A 07/22/2016   Procedure: BIOPSY TRANSRECTAL ULTRASONIC PROSTATE (TUBP);  Surgeon: Festus Aloe, MD;  Location: [facility];  Service: Urology;  Laterality: N/A;  . TONSILLECTOMY  1951  . TRANSTHORACIC ECHOCARDIOGRAM  08/23/2015   mild LVH, ef 55-60%, grade 1 diastolitc dysfunction  . [place] EXTRACTION  1985   Social History   Socioeconomic History  . Marital status: Divorced    Spouse name: Not on file  . Number of children: Not on file  . Years of education: Not on file  . Highest education level: Not on file  Occupational History  . Not on file  Tobacco Use  . Smoking status: Former Smoker    Packs/day: 2.00    Years: 20.00    Pack years: 40.00    Quit date: 05/09/1983    Years since quitting: 36.3  . Smokeless tobacco: Never Used  Substance and Sexual Activity  . Alcohol use: No  . Drug use: No  . Sexual activity: Not Currently  Other Topics Concern  . Not on file  Social History Narrative   Lives at [facility]  [place] through rehab after falling and breaking right shoulder. Phone: [phone] Fax: [phone]      Social Determinants of Health   Financial Resource Strain:   . Difficulty of Paying Living Expenses: Not on file  Food Insecurity:   . Worried About Charity fundraiser in the Last Year: Not on file  . Ran Out of Food in the Last Year: Not on file  Transportation Needs:   . Lack of Transportation (Medical): Not on file  . Lack of Transportation (Non-Medical): Not on file  Physical Activity:   . Days of Exercise per Week: Not on file  . Minutes of Exercise per Session: Not on file  Stress:   . Feeling of Stress : Not on file  Social Connections:   . Frequency of  Communication with Friends and Family: Not on file  . Frequency of Social Gatherings with Friends and Family: Not on file  . Attends Religious Services: Not on file  . Active Member of Clubs or Organizations: Not on file  . Attends Archivist Meetings: Not on file  . Marital Status: Not on file   Family History  Problem Relation Age of Onset  . Cancer Sister        lung to brain/smoker  . Colon cancer Neg Hx   . Rectal cancer Neg Hx   . Stomach cancer Neg Hx   . CAD Neg Hx   . Dementia Neg Hx    - negative except otherwise stated in the family history section Allergies  Allergen Reactions  . Ciprofloxacin Diarrhea  . Oxycodone Other (See Comments)    "confusion and loopy"   Prior to Admission medications   Medication Sig Start Date End Date Taking? Authorizing Provider  acetaminophen (TYLENOL) 500 MG tablet Take 1,000 mg by mouth every 6 (six) hours as needed for mild pain, moderate pain, fever or headache.    [provider]  amLODipine (NORVASC) 5 MG tablet Take by mouth.    [provider]  atorvastatin (LIPITOR) 20 MG tablet Take 1 tablet (20 mg total) by mouth daily. Patient taking differently: Take 20 mg by mouth every evening.  09/22/13   Burnell Blanks, MD  donepezil (ARICEPT) 10 MG tablet Take 1 tablet (10 mg total) by mouth at bedtime. 11/09/17   Melvenia Beam, MD  dorzolamide (TRUSOPT) 2 % ophthalmic solution Place 1 drop into both eyes 2 (two) times daily.  06/20/15   [provider]  finasteride (PROSCAR) 5 MG tablet Take 5 mg by mouth every evening.  05/16/16   [provider]  furosemide (LASIX) 40 MG tablet Take two (2) tablets (80 mg total) by mouth each morning and take one (1) tablet (40 mg total) by mouth each afternoon. Please make appt. 01/07/18   Burnell Blanks, MD  Hyoscyamine Sulfate SL (LEVSIN/SL) 0.125 MG SUBL Dissolve 1 tablet on the tongue at the first sign of diarrhea episodes, you may use 1  tablet every 6 hours as needed. 06/03/18   Esterwood, Amy S, PA-C  insulin aspart (NOVOLOG) 100 UNIT/ML injection Inject 6 Units into the skin 3 (three) times daily before meals.     [provider]  insulin glargine (LANTUS) 100 UNIT/ML injection Inject 40 Units into the skin at bedtime.     [provider]  lisinopril (PRINIVIL,ZESTRIL) 20 MG tablet Take 10 mg by mouth daily.     [provider]  metFORMIN (GLUCOPHAGE) 1000 MG tablet Take  1,000 mg by mouth 2 (two) times daily with a meal.    [provider]  metoprolol succinate (TOPROL-XL) 50 MG 24 hr tablet Take 1 tablet (50 mg total) by mouth 2 (two) times daily. Take with or immediately following a meal. Patient taking differently: Take 25 mg by mouth daily. Take with or immediately following a meal. 04/04/15   Burnell Blanks, MD  omeprazole (PRILOSEC) 20 MG capsule Take 20 mg by mouth every morning.  04/26/11   [provider]  polyethylene glycol (MIRALAX / GLYCOLAX) packet Take 17 g by mouth daily.    [provider]  potassium chloride (MICRO-K) 10 MEQ CR capsule TK 1 C PO D 09/09/18   [provider]  PRESCRIPTION MEDICATION every 6 (six) weeks. Injections in eyes at dr's office    [provider]  rivaroxaban (XARELTO) 20 MG TABS tablet Take 1 tablet (20 mg total) by mouth daily with supper. 07/23/16   Festus Aloe, MD  tamsulosin (FLOMAX) 0.4 MG CAPS capsule Take 0.4 mg by mouth daily with breakfast.  01/09/14   [provider]   DG Orbits  Result Date: 09/10/2019 CLINICAL DATA:  Initial evaluation for MRI clearance. EXAM: ORBITS - COMPLETE 4+ VIEW COMPARISON:  None. FINDINGS: There is no evidence of fracture or other significant bone abnormality. No orbital emphysema or sinus air-fluid levels are seen. IMPRESSION: Negative.  No metallic foreign body seen overlying either orbit. Electronically Signed   By: Jeannine Boga M.D.   On: 09/10/2019  00:47   MR HEEL LEFT W WO CONTRAST  Result Date: 09/10/2019 CLINICAL DATA:  Left heel wound, diabetes EXAM: MRI OF LOWER LEFT EXTREMITY WITHOUT AND WITH CONTRAST TECHNIQUE: Multiplanar, multisequence MR imaging of the left hindfoot was performed both before and after administration of intravenous contrast. CONTRAST:  46mL GADAVIST GADOBUTROL 1 MMOL/ML IV SOLN COMPARISON:  X-ray 09/09/2019 FINDINGS: Bones/Joint/Cartilage Loss of cortical definition of the posterolateral aspect of the left calcaneus with extensive bone marrow edema and enhancement. There is confluent low T1 signal compatible with acute osteomyelitis (series 6, images 24-28; series 12, image 14). The abnormal marrow signal is confined to the posterolateral calcaneus and measures up to 1 cm in AP depth with a thin amount of abnormal signal extending along the lateral aspect of the posterior calcaneus closely approximating the peroneal tubercle (series 4, image 24). There is involvement of the lateral most aspect of the distal Achilles tendon insertion (series 4, image 23) as well as the attachment of the plantar fascia (series 13, image 14). The remaining marrow signal of the calcaneal body is preserved. Remaining marrow structures of the hindfoot are intact. There are no fractures. Scattered degenerative changes involving the midfoot and subtalar joints. No talar dome chondral defect. No tibiotalar or subtalar joint effusion. Ligaments Medial and lateral ankle ligaments are grossly intact. Muscles and Tendons Diffuse intramuscular edema which may reflect denervation changes versus myositis. Achilles tendon intact. Peroneal tendons, posteromedial ankle tendons, and anterior ankle tendons are intact. Plantar fascia remains intact. Soft tissues Large soft tissue ulceration overlying the posterolateral aspect of the calcaneus closely approximating the underlying bone. There is poorly enhancing tissue in this region compatible with tissue necrosis  (series 19, image 17). Marked circumferential subcutaneous edema without a well-defined fluid collection. IMPRESSION: 1. 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 Abnormal marrow signal involves the lateral most aspect of the distal Achilles tendon insertion  as well as the attachment of the plantar fascia. 2. Marked circumferential subcutaneous edema without a well-defined fluid collection. 3. Diffuse intramuscular edema which may reflect denervation changes versus myositis. Electronically Signed   By: Davina Poke D.O.   On: 09/10/2019 09:13   DG Foot Complete Left  Result Date: 09/09/2019 CLINICAL DATA:  Left heel wound. Concern for osteomyelitis. Recent left hip fracture. EXAM: LEFT FOOT - COMPLETE 3+ VIEW COMPARISON:  None. FINDINGS: Presumed surgical absence of the second phalanx. Vascular calcifications. Lucency superficial to the calcaneus, likely the site of soft tissue ulcer. No soft tissue gas. No radiopaque foreign object. No osseous destruction. Diffuse soft tissue swelling about the dorsal forefoot. IMPRESSION: No plain film evidence of osteomyelitis. Forefoot soft tissue swelling. Electronically Signed   By: Abigail Miyamoto M.D.   On: 09/09/2019 17:00   - pertinent xrays, CT, MRI studies were reviewed and independently interpreted  Positive ROS: All other systems have been reviewed and were otherwise negative with the exception of those mentioned in the HPI and as above.  Physical Exam: General: Alert, no acute distress Psychiatric: Patient is competent for consent with normal mood and affect Lymphatic: No axillary or cervical lymphadenopathy Cardiovascular: No pedal edema Respiratory: No cyanosis, no use of accessory musculature GI: No organomegaly, abdomen is soft and non-tender    Images:  @ENCIMAGES @  Labs:  Lab Results  Component Value Date   HGBA1C 6.8 (H)  09/09/2019   ESRSEDRATE 14 09/09/2019   CRP 3.4 (H) 09/09/2019   REPTSTATUS PENDING 09/09/2019   GRAMSTAIN  12/01/2018    MODERATE WBC PRESENT, PREDOMINANTLY MONONUCLEAR NO ORGANISMS SEEN Performed at [facility], [address], [place], [place] [address]    CULT  09/09/2019    NO GROWTH < 24 HOURS Performed at [facility] [address], [place], [place] [address]     Lab Results  Component Value Date   ALBUMIN 2.7 (L) 09/09/2019    Neurologic: Patient does not have protective sensation bilateral lower extremities.   MUSCULOSKELETAL:   Skin: Examination patient has a full-thickness ulcer over the left heel that extends down to the calcaneus.  This is approximately 5 cm in diameter.  The MRI scan shows underlying osteomyelitis of the calcaneus.  Patient has a superficial ulcer on the right heel without full-thickness skin loss.  Patient does not have a palpable pulse bilaterally he has pitting edema both lower extremities with venous insufficiency.  No venous ulcers.  Patient does not have a palpable pulse bilaterally.  Assessment: Assessment: Diabetic insensate neuropathy with gangrenous left heel ulcer osteomyelitis of the calcaneus with peripheral vascular disease and protein caloric malnutrition  Plan: Plan: Ankle-brachial indices are ordered.  Patient will require surgical intervention on the left and most likely a transtibial amputation.  Wound care for the right heel.  I will follow-up on Monday to determine timing for surgical intervention.  Thank you for the consult and the opportunity to see Darrell Mcclain, [place] [phone] 11:04 AM

## 2019-09-10 NOTE — Consult Note (Signed)
ORTHOPAEDIC CONSULTATION  REQUESTING PHYSICIAN: Karmen Bongo, MD  Chief Complaint: Gangrenous ulcer left heel with osteomyelitis of the calcaneus and superficial ulcer right heel.  HPI: Darrell Mcclain is a 79 y.o. male who presents with gangrenous ulcer and osteomyelitis left calcaneus with superficial ulcer on the right.  Patient has diabetic insensate neuropathy peripheral vascular disease and protein caloric malnutrition.  Past Medical History:  Diagnosis Date  . Anticoagulant long-term use    xarelto  . Arthritis    right hand  . At risk for sleep apnea    STOP-BANG==  6      PT ALREADY SET UP TO HAVE STUDY AT [facility] , PCP AWARE  . Atrial flutter, chronic [facility]) dx 01/ 2015   cardiologist-  dr Angelena Form  . Bilateral lower extremity edema   . BPH (benign prostatic hyperplasia)   . Cerebral microvascular disease    advanced and multiple foci throughout hemispheres and brain stem-- residual memory issues  . Chronic constipation   . Chronic kidney disease (CKD), stage III (moderate)    nephrologist-  dr Marval Regal [facility])  . Diastolic CHF, chronic ([place])   . Exertional dyspnea   . Foley catheter in place   . GERD (gastroesophageal reflux disease)   . Glaucoma, both eyes   . History of adenomatous polyp of colon    2012  . History of CVA (cerebrovascular accident)    (pt had no symptoms other than memory issues) per MRI 11-29-2015  very small infarct right frontal lobe and multiple foci throughout hemispheres and brain stem  . 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 Hyperlipidemia   . Hypertension   . Iron deficiency anemia   . Memory loss   . Prostate cancer ([place])   . RBBB (right bundle branch block with left anterior fascicular block)   . Rosacea   . Type 2 diabetes mellitus ([place])   . Urine retention   . Vitamin B 12 deficiency    Past Surgical History:  Procedure Laterality Date  . CATARACT EXTRACTION W/ INTRAOCULAR LENS   IMPLANT, BILATERAL  2016  . IR THORACENTESIS ASP PLEURAL SPACE W/IMG GUIDE  12/01/2018  . ORIF PROXIMAL HUMEROUS FX AND BICEPS TENODESIS  11/09/2015  . OTHER SURGICAL HISTORY  1996   reconstruction left toe (amputation repair)  . PROSTATE BIOPSY N/A 07/22/2016   Procedure: BIOPSY TRANSRECTAL ULTRASONIC PROSTATE (TUBP);  Surgeon: Festus Aloe, MD;  Location: [facility];  Service: Urology;  Laterality: N/A;  . TONSILLECTOMY  1951  . TRANSTHORACIC ECHOCARDIOGRAM  08/23/2015   mild LVH, ef 55-60%, grade 1 diastolitc dysfunction  . [place] EXTRACTION  1985   Social History   Socioeconomic History  . Marital status: Divorced    Spouse name: Not on file  . Number of children: Not on file  . Years of education: Not on file  . Highest education level: Not on file  Occupational History  . Not on file  Tobacco Use  . Smoking status: Former Smoker    Packs/day: 2.00    Years: 20.00    Pack years: 40.00    Quit date: 05/09/1983    Years since quitting: 36.3  . Smokeless tobacco: Never Used  Substance and Sexual Activity  . Alcohol use: No  . Drug use: No  . Sexual activity: Not Currently  Other Topics Concern  . Not on file  Social History Narrative   Lives at [facility]  [place] through rehab after falling and breaking right shoulder. Phone: [phone] Fax: [phone]      Social Determinants of Health   Financial Resource Strain:   . Difficulty of Paying Living Expenses: Not on file  Food Insecurity:   . Worried About Charity fundraiser in the Last Year: Not on file  . Ran Out of Food in the Last Year: Not on file  Transportation Needs:   . Lack of Transportation (Medical): Not on file  . Lack of Transportation (Non-Medical): Not on file  Physical Activity:   . Days of Exercise per Week: Not on file  . Minutes of Exercise per Session: Not on file  Stress:   . Feeling of Stress : Not on file  Social Connections:   . Frequency of  Communication with Friends and Family: Not on file  . Frequency of Social Gatherings with Friends and Family: Not on file  . Attends Religious Services: Not on file  . Active Member of Clubs or Organizations: Not on file  . Attends Archivist Meetings: Not on file  . Marital Status: Not on file   Family History  Problem Relation Age of Onset  . Cancer Sister        lung to brain/smoker  . Colon cancer Neg Hx   . Rectal cancer Neg Hx   . Stomach cancer Neg Hx   . CAD Neg Hx   . Dementia Neg Hx    - negative except otherwise stated in the family history section Allergies  Allergen Reactions  . Ciprofloxacin Diarrhea  . Oxycodone Other (See Comments)    "confusion and loopy"   Prior to Admission medications   Medication Sig Start Date End Date Taking? Authorizing Provider  acetaminophen (TYLENOL) 500 MG tablet Take 1,000 mg by mouth every 6 (six) hours as needed for mild pain, moderate pain, fever or headache.    [provider]  amLODipine (NORVASC) 5 MG tablet Take by mouth.    [provider]  atorvastatin (LIPITOR) 20 MG tablet Take 1 tablet (20 mg total) by mouth daily. Patient taking differently: Take 20 mg by mouth every evening.  09/22/13   Burnell Blanks, MD  donepezil (ARICEPT) 10 MG tablet Take 1 tablet (10 mg total) by mouth at bedtime. 11/09/17   Melvenia Beam, MD  dorzolamide (TRUSOPT) 2 % ophthalmic solution Place 1 drop into both eyes 2 (two) times daily.  06/20/15   [provider]  finasteride (PROSCAR) 5 MG tablet Take 5 mg by mouth every evening.  05/16/16   [provider]  furosemide (LASIX) 40 MG tablet Take two (2) tablets (80 mg total) by mouth each morning and take one (1) tablet (40 mg total) by mouth each afternoon. Please make appt. 01/07/18   Burnell Blanks, MD  Hyoscyamine Sulfate SL (LEVSIN/SL) 0.125 MG SUBL Dissolve 1 tablet on the tongue at the first sign of diarrhea episodes, you may use 1  tablet every 6 hours as needed. 06/03/18   Esterwood, Amy S, PA-C  insulin aspart (NOVOLOG) 100 UNIT/ML injection Inject 6 Units into the skin 3 (three) times daily before meals.     [provider]  insulin glargine (LANTUS) 100 UNIT/ML injection Inject 40 Units into the skin at bedtime.     [provider]  lisinopril (PRINIVIL,ZESTRIL) 20 MG tablet Take 10 mg by mouth daily.     [provider]  metFORMIN (GLUCOPHAGE) 1000 MG tablet Take  1,000 mg by mouth 2 (two) times daily with a meal.    [provider]  metoprolol succinate (TOPROL-XL) 50 MG 24 hr tablet Take 1 tablet (50 mg total) by mouth 2 (two) times daily. Take with or immediately following a meal. Patient taking differently: Take 25 mg by mouth daily. Take with or immediately following a meal. 04/04/15   Burnell Blanks, MD  omeprazole (PRILOSEC) 20 MG capsule Take 20 mg by mouth every morning.  04/26/11   [provider]  polyethylene glycol (MIRALAX / GLYCOLAX) packet Take 17 g by mouth daily.    [provider]  potassium chloride (MICRO-K) 10 MEQ CR capsule TK 1 C PO D 09/09/18   [provider]  PRESCRIPTION MEDICATION every 6 (six) weeks. Injections in eyes at dr's office    [provider]  rivaroxaban (XARELTO) 20 MG TABS tablet Take 1 tablet (20 mg total) by mouth daily with supper. 07/23/16   Festus Aloe, MD  tamsulosin (FLOMAX) 0.4 MG CAPS capsule Take 0.4 mg by mouth daily with breakfast.  01/09/14   [provider]   DG Orbits  Result Date: 09/10/2019 CLINICAL DATA:  Initial evaluation for MRI clearance. EXAM: ORBITS - COMPLETE 4+ VIEW COMPARISON:  None. FINDINGS: There is no evidence of fracture or other significant bone abnormality. No orbital emphysema or sinus air-fluid levels are seen. IMPRESSION: Negative.  No metallic foreign body seen overlying either orbit. Electronically Signed   By: Jeannine Boga M.D.   On: 09/10/2019  00:47   MR HEEL LEFT W WO CONTRAST  Result Date: 09/10/2019 CLINICAL DATA:  Left heel wound, diabetes EXAM: MRI OF LOWER LEFT EXTREMITY WITHOUT AND WITH CONTRAST TECHNIQUE: Multiplanar, multisequence MR imaging of the left hindfoot was performed both before and after administration of intravenous contrast. CONTRAST:  23mL GADAVIST GADOBUTROL 1 MMOL/ML IV SOLN COMPARISON:  X-ray 09/09/2019 FINDINGS: Bones/Joint/Cartilage Loss of cortical definition of the posterolateral aspect of the left calcaneus with extensive bone marrow edema and enhancement. There is confluent low T1 signal compatible with acute osteomyelitis (series 6, images 24-28; series 12, image 14). The abnormal marrow signal is confined to the posterolateral calcaneus and measures up to 1 cm in AP depth with a thin amount of abnormal signal extending along the lateral aspect of the posterior calcaneus closely approximating the peroneal tubercle (series 4, image 24). There is involvement of the lateral most aspect of the distal Achilles tendon insertion (series 4, image 23) as well as the attachment of the plantar fascia (series 13, image 14). The remaining marrow signal of the calcaneal body is preserved. Remaining marrow structures of the hindfoot are intact. There are no fractures. Scattered degenerative changes involving the midfoot and subtalar joints. No talar dome chondral defect. No tibiotalar or subtalar joint effusion. Ligaments Medial and lateral ankle ligaments are grossly intact. Muscles and Tendons Diffuse intramuscular edema which may reflect denervation changes versus myositis. Achilles tendon intact. Peroneal tendons, posteromedial ankle tendons, and anterior ankle tendons are intact. Plantar fascia remains intact. Soft tissues Large soft tissue ulceration overlying the posterolateral aspect of the calcaneus closely approximating the underlying bone. There is poorly enhancing tissue in this region compatible with tissue necrosis  (series 19, image 17). Marked circumferential subcutaneous edema without a well-defined fluid collection. IMPRESSION: 1. 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 Abnormal marrow signal involves the lateral most aspect of the distal Achilles tendon insertion  as well as the attachment of the plantar fascia. 2. Marked circumferential subcutaneous edema without a well-defined fluid collection. 3. Diffuse intramuscular edema which may reflect denervation changes versus myositis. Electronically Signed   By: Davina Poke D.O.   On: 09/10/2019 09:13   DG Foot Complete Left  Result Date: 09/09/2019 CLINICAL DATA:  Left heel wound. Concern for osteomyelitis. Recent left hip fracture. EXAM: LEFT FOOT - COMPLETE 3+ VIEW COMPARISON:  None. FINDINGS: Presumed surgical absence of the second phalanx. Vascular calcifications. Lucency superficial to the calcaneus, likely the site of soft tissue ulcer. No soft tissue gas. No radiopaque foreign object. No osseous destruction. Diffuse soft tissue swelling about the dorsal forefoot. IMPRESSION: No plain film evidence of osteomyelitis. Forefoot soft tissue swelling. Electronically Signed   By: Abigail Miyamoto M.D.   On: 09/09/2019 17:00   - pertinent xrays, CT, MRI studies were reviewed and independently interpreted  Positive ROS: All other systems have been reviewed and were otherwise negative with the exception of those mentioned in the HPI and as above.  Physical Exam: General: Alert, no acute distress Psychiatric: Patient is competent for consent with normal mood and affect Lymphatic: No axillary or cervical lymphadenopathy Cardiovascular: No pedal edema Respiratory: No cyanosis, no use of accessory musculature GI: No organomegaly, abdomen is soft and non-tender    Images:  @ENCIMAGES @  Labs:  Lab Results  Component Value Date   HGBA1C 6.8 (H)  09/09/2019   ESRSEDRATE 14 09/09/2019   CRP 3.4 (H) 09/09/2019   REPTSTATUS PENDING 09/09/2019   GRAMSTAIN  12/01/2018    MODERATE WBC PRESENT, PREDOMINANTLY MONONUCLEAR NO ORGANISMS SEEN Performed at [facility], [address] [place], [place] [address]    CULT  09/09/2019    NO GROWTH < 24 HOURS Performed at [facility] [address], [place], [place] [address]     Lab Results  Component Value Date   ALBUMIN 2.7 (L) 09/09/2019    Neurologic: Patient does not have protective sensation bilateral lower extremities.   MUSCULOSKELETAL:   Skin: Examination patient has a full-thickness ulcer over the left heel that extends down to the calcaneus.  This is approximately 5 cm in diameter.  The MRI scan shows underlying osteomyelitis of the calcaneus.  Patient has a superficial ulcer on the right heel without full-thickness skin loss.  Patient does not have a palpable pulse bilaterally he has pitting edema both lower extremities with venous insufficiency.  No venous ulcers.  Patient does not have a palpable pulse bilaterally.  Assessment: Assessment: Diabetic insensate neuropathy with gangrenous left heel ulcer osteomyelitis of the calcaneus with peripheral vascular disease and protein caloric malnutrition  Plan: Plan: Ankle-brachial indices are ordered.  Patient will require surgical intervention on the left and most likely a transtibial amputation.  Wound care for the right heel.  I will follow-up on Monday to determine timing for surgical intervention.  Thank you for the consult and the opportunity to see Mr. Abiel Antrim, [place] [phone] 11:04 AM

## 2019-09-10 NOTE — H&P (Signed)
History and Physical    Brahm Barbeau KZS:[id number] DOB: April 08, 1941 DOA: 09/09/2019  PCP: Burnard Bunting, MD Consultants:  Laurance Flatten - orthopedics; Michela Pitcher - pulmonology; McAlhany/Saliashvili - cardiology; Henrene Pastor - GI; Prudence Davidson - podiatry; [facility] - nephrology Patient coming from: [facility]; NOK: Ramon Dredge, [phone]; [phone] - ex-wife; Daughter, Colletta [place], [phone]  Chief Complaint: Foot infection  HPI: Darrell Mcclain is a 79 y.o. male with medical history significant of DM; prostate CA; dementia; HTN; HLD; h/o CVA; BPH with h/o chronic indwelling foley (not currently present); chronic diastolic CHF; stage 3 CKD; chronic respiratory failure on home O2; and atrial flutter on Xarelto presenting with a foot infection.  "My feet, I got some kind of an infection in both of them.  They got me in the hospital treating me with this stuff.  I guess it works pretty good."  He is having pain.  Fevers are "not as bad as they were."  I spoke with his ex-wife - they haven't been married in 20+ years. She thinks he has been on an antibiotics, maybe Doxycycline.  He does have vascular dementia.  He fell a few months ago and broke his femur and then went to rehab, where he is likely to stay for the remainder of his life.  He has a "lung issue" and wears chronic O2.      ED Course:  Carryover, per Dr. Hal Hope:  79 year old male with known history of diabetes presents with left foot ulceration MRI of the left foot shows osteomyelitis. ER physician discussed with Dr. Veverly Fells who advised to infectious disease consult.   Review of Systems: As per HPI; otherwise review of systems reviewed and negative.   Ambulatory Status:  Ambulates with a walker per patient; his ex-wife reports that he is non-ambulatory  Past Medical History:  Diagnosis Date  . Anticoagulant long-term use    xarelto  . Arthritis    right hand  . At risk for sleep apnea    STOP-BANG==  6      PT ALREADY SET UP TO  HAVE STUDY AT [facility] , PCP AWARE  . Atrial flutter, chronic [facility]) dx 01/ 2015   cardiologist-  dr Angelena Form  . Bilateral lower extremity edema   . BPH (benign prostatic hyperplasia)   . Cerebral microvascular disease    advanced and multiple foci throughout hemispheres and brain stem-- residual memory issues  . Chronic constipation   . Chronic kidney disease (CKD), stage III (moderate)    nephrologist-  dr Marval Regal [facility])  . Diastolic CHF, chronic ([place])   . Exertional dyspnea   . Foley catheter in place   . GERD (gastroesophageal reflux disease)   . Glaucoma, both eyes   . History of adenomatous polyp of colon    2012  . History of CVA (cerebrovascular accident)    (pt had no symptoms other than memory issues) per MRI 11-29-2015  very small infarct right frontal lobe and multiple foci throughout hemispheres and brain stem  . 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 Hyperlipidemia   . Hypertension   . Iron deficiency anemia   . Memory loss   . Prostate cancer ([place])   . RBBB (right bundle branch block with left anterior fascicular block)   . Rosacea   . Type 2 diabetes mellitus ([place])   . Urine retention   . Vitamin B 12 deficiency     Past Surgical History:  Procedure Laterality Date  . CATARACT  EXTRACTION W/ INTRAOCULAR LENS  IMPLANT, BILATERAL  2016  . IR THORACENTESIS ASP PLEURAL SPACE W/IMG GUIDE  12/01/2018  . ORIF PROXIMAL HUMEROUS FX AND BICEPS TENODESIS  11/09/2015  . OTHER SURGICAL HISTORY  1996   reconstruction left toe (amputation repair)  . PROSTATE BIOPSY N/A 07/22/2016   Procedure: BIOPSY TRANSRECTAL ULTRASONIC PROSTATE (TUBP);  Surgeon: Festus Aloe, MD;  Location: [facility];  Service: Urology;  Laterality: N/A;  . TONSILLECTOMY  1951  . TRANSTHORACIC ECHOCARDIOGRAM  08/23/2015   mild LVH, ef 55-60%, grade 1 diastolitc dysfunction  . [place] EXTRACTION  1985    Social History   Socioeconomic  History  . Marital status: Divorced    Spouse name: Not on file  . Number of children: Not on file  . Years of education: Not on file  . Highest education level: Not on file  Occupational History  . Not on file  Tobacco Use  . Smoking status: Former Smoker    Packs/day: 2.00    Years: 20.00    Pack years: 40.00    Quit date: 05/09/1983    Years since quitting: 36.3  . Smokeless tobacco: Never Used  Substance and Sexual Activity  . Alcohol use: No  . Drug use: No  . Sexual activity: Not Currently  Other Topics Concern  . Not on file  Social History Narrative   Lives at [facility] through rehab after falling and breaking right shoulder. Phone: [phone] Fax: [phone]      Social Determinants of Health   Financial Resource Strain:   . Difficulty of Paying Living Expenses: Not on file  Food Insecurity:   . Worried About Charity fundraiser in the Last Year: Not on file  . Ran Out of Food in the Last Year: Not on file  Transportation Needs:   . Lack of Transportation (Medical): Not on file  . Lack of Transportation (Non-Medical): Not on file  Physical Activity:   . Days of Exercise per Week: Not on file  . Minutes of Exercise per Session: Not on file  Stress:   . Feeling of Stress : Not on file  Social Connections:   . Frequency of Communication with Friends and Family: Not on file  . Frequency of Social Gatherings with Friends and Family: Not on file  . Attends Religious Services: Not on file  . Active Member of Clubs or Organizations: Not on file  . Attends Archivist Meetings: Not on file  . Marital Status: Not on file  Intimate Partner Violence:   . Fear of Current or Ex-Partner: Not on file  . Emotionally Abused: Not on file  . Physically Abused: Not on file  . Sexually Abused: Not on file    Allergies  Allergen Reactions  . Ciprofloxacin Diarrhea  . Oxycodone Other (See Comments)    "confusion and loopy"    Family  History  Problem Relation Age of Onset  . Cancer Sister        lung to brain/smoker  . Colon cancer Neg Hx   . Rectal cancer Neg Hx   . Stomach cancer Neg Hx   . CAD Neg Hx   . Dementia Neg Hx     Prior to Admission medications   Medication Sig Start Date End Date Taking? Authorizing Provider  acetaminophen (TYLENOL) 500 MG tablet Take 1,000 mg by mouth every 6 (six) hours as needed for mild pain, moderate pain, fever or headache.  [provider]  amLODipine (NORVASC) 5 MG tablet Take by mouth.    [provider]  atorvastatin (LIPITOR) 20 MG tablet Take 1 tablet (20 mg total) by mouth daily. Patient taking differently: Take 20 mg by mouth every evening.  09/22/13   Burnell Blanks, MD  donepezil (ARICEPT) 10 MG tablet Take 1 tablet (10 mg total) by mouth at bedtime. 11/09/17   Melvenia Beam, MD  dorzolamide (TRUSOPT) 2 % ophthalmic solution Place 1 drop into both eyes 2 (two) times daily.  06/20/15   [provider]  finasteride (PROSCAR) 5 MG tablet Take 5 mg by mouth every evening.  05/16/16   [provider]  furosemide (LASIX) 40 MG tablet Take two (2) tablets (80 mg total) by mouth each morning and take one (1) tablet (40 mg total) by mouth each afternoon. Please make appt. 01/07/18   Burnell Blanks, MD  Hyoscyamine Sulfate SL (LEVSIN/SL) 0.125 MG SUBL Dissolve 1 tablet on the tongue at the first sign of diarrhea episodes, you may use 1 tablet every 6 hours as needed. 06/03/18   Esterwood, Amy S, PA-C  insulin aspart (NOVOLOG) 100 UNIT/ML injection Inject 6 Units into the skin 3 (three) times daily before meals.     [provider]  insulin glargine (LANTUS) 100 UNIT/ML injection Inject 40 Units into the skin at bedtime.     [provider]  lisinopril (PRINIVIL,ZESTRIL) 20 MG tablet Take 10 mg by mouth daily.     [provider]  metFORMIN (GLUCOPHAGE) 1000 MG tablet Take 1,000 mg by mouth 2 (two) times  daily with a meal.    [provider]  metoprolol succinate (TOPROL-XL) 50 MG 24 hr tablet Take 1 tablet (50 mg total) by mouth 2 (two) times daily. Take with or immediately following a meal. Patient taking differently: Take 25 mg by mouth daily. Take with or immediately following a meal. 04/04/15   Burnell Blanks, MD  omeprazole (PRILOSEC) 20 MG capsule Take 20 mg by mouth every morning.  04/26/11   [provider]  polyethylene glycol (MIRALAX / GLYCOLAX) packet Take 17 g by mouth daily.    [provider]  potassium chloride (MICRO-K) 10 MEQ CR capsule TK 1 C PO D 09/09/18   [provider]  PRESCRIPTION MEDICATION every 6 (six) weeks. Injections in eyes at dr's office    [provider]  rivaroxaban (XARELTO) 20 MG TABS tablet Take 1 tablet (20 mg total) by mouth daily with supper. 07/23/16   Festus Aloe, MD  tamsulosin (FLOMAX) 0.4 MG CAPS capsule Take 0.4 mg by mouth daily with breakfast.  01/09/14   [provider]    Physical Exam: Vitals:   09/10/19 0830 09/10/19 0845 09/10/19 0944 09/10/19 0947  BP: (!) 153/78 (!) 143/79  (!) 156/82  Pulse: 66 61  76  Resp: (!) 21 12  18   Temp:    98.2 F (36.8 C)  TempSrc:    Oral  SpO2: 99% 100%  97%  Weight:   95 kg      . General:  Appears calm and comfortable and is NAD . Eyes:  PERRL, EOMI, normal lids, iris . ENT:  grossly normal hearing, lips & tongue, mmm . Neck:  no LAD, masses or thyromegaly . Cardiovascular:  RRR, no m/r/g. . Respiratory:   CTA bilaterally with no wheezes/rales/rhonchi.  Normal respiratory effort. . Abdomen:  soft, NT, ND, NABS . Skin:  B heel ulcerations -  on the L there is eschar present without frank drainage; on the R the ulceration appears to be newer without eschar but with purulent drainage.  Both feet/legs appear to have skin sloughing that may be c/w fungal infection.       . Musculoskeletal:  Mildly atrophic tone BUE/BLE, no bony  abnormality, mild B pedal edema, 1+ distal pulses B . Psychiatric:  grossly normal mood and affect, speech fluent and appropriate at times and at other times the history is not able to be effectively obtained due to patient confusion, AOx2-3 . Neurologic:  CN 2-12 grossly intact, moves all extremities in coordinated fashion    Radiological Exams on Admission: DG Orbits  Result Date: 09/10/2019 CLINICAL DATA:  Initial evaluation for MRI clearance. EXAM: ORBITS - COMPLETE 4+ VIEW COMPARISON:  None. FINDINGS: There is no evidence of fracture or other significant bone abnormality. No orbital emphysema or sinus air-fluid levels are seen. IMPRESSION: Negative.  No metallic foreign body seen overlying either orbit. Electronically Signed   By: Jeannine Boga M.D.   On: 09/10/2019 00:47   MR HEEL LEFT W WO CONTRAST  Result Date: 09/10/2019 CLINICAL DATA:  Left heel wound, diabetes EXAM: MRI OF LOWER LEFT EXTREMITY WITHOUT AND WITH CONTRAST TECHNIQUE: Multiplanar, multisequence MR imaging of the left hindfoot was performed both before and after administration of intravenous contrast. CONTRAST:  29m GADAVIST GADOBUTROL 1 MMOL/ML IV SOLN COMPARISON:  X-ray 09/09/2019 FINDINGS: Bones/Joint/Cartilage Loss of cortical definition of the posterolateral aspect of the left calcaneus with extensive bone marrow edema and enhancement. There is confluent low T1 signal compatible with acute osteomyelitis (series 6, images 24-28; series 12, image 14). The abnormal marrow signal is confined to the posterolateral calcaneus and measures up to 1 cm in AP depth with a thin amount of abnormal signal extending along the lateral aspect of the posterior calcaneus closely approximating the peroneal tubercle (series 4, image 24). There is involvement of the lateral most aspect of the distal Achilles tendon insertion (series 4, image 23) as well as the attachment of the plantar fascia (series 13, image 14). The remaining marrow signal  of the calcaneal body is preserved. Remaining marrow structures of the hindfoot are intact. There are no fractures. Scattered degenerative changes involving the midfoot and subtalar joints. No talar dome chondral defect. No tibiotalar or subtalar joint effusion. Ligaments Medial and lateral ankle ligaments are grossly intact. Muscles and Tendons Diffuse intramuscular edema which may reflect denervation changes versus myositis. Achilles tendon intact. Peroneal tendons, posteromedial ankle tendons, and anterior ankle tendons are intact. Plantar fascia remains intact. Soft tissues Large soft tissue ulceration overlying the posterolateral aspect of the calcaneus closely approximating the underlying bone. There is poorly enhancing tissue in this region compatible with tissue necrosis (series 19, image 17). Marked circumferential subcutaneous edema without a well-defined fluid collection. IMPRESSION: 1. 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 Abnormal marrow signal involves the lateral most aspect of the distal Achilles tendon insertion as well as the attachment of the plantar fascia. 2. Marked circumferential subcutaneous edema without a well-defined fluid collection. 3. Diffuse intramuscular edema which may reflect denervation changes versus myositis. Electronically Signed   By: NDavina PokeD.O.   On: 09/10/2019 09:13   DG Foot Complete Left  Result Date: 09/09/2019 CLINICAL DATA:  Left heel wound. Concern for osteomyelitis. Recent left hip fracture. EXAM: LEFT FOOT - COMPLETE 3+ VIEW COMPARISON:  None. FINDINGS:  Presumed surgical absence of the second phalanx. Vascular calcifications. Lucency superficial to the calcaneus, likely the site of soft tissue ulcer. No soft tissue gas. No radiopaque foreign object. No osseous destruction. Diffuse soft tissue swelling about the dorsal forefoot. IMPRESSION: No  plain film evidence of osteomyelitis. Forefoot soft tissue swelling. Electronically Signed   By: Abigail Miyamoto M.D.   On: 09/09/2019 17:00    EKG: Independently reviewed.  Atrial flutter with rate 73; RBBB; nonspecific ST changes with no evidence of acute ischemia   Labs on Admission: I have personally reviewed the available labs and imaging studies at the time of the admission.  Pertinent labs:   CO2 34 Glucose 180 BUN 22/Creatinine 1.26/GFR 55 Albumin 2.7 CRP 3.4 WBC 9.7 Hgb 11.7 A1c 6.8 COVID negative   Assessment/Plan Principal Problem:   Osteomyelitis (HCC) Active Problems:   Atrial flutter (HCC)   Chronic diastolic CHF (congestive heart failure), NYHA class 2 (HCC)   Memory loss   Uncontrolled diabetes mellitus ([place])   Morbid obesity (HCC)   Hyperlipidemia   Essential hypertension   Malignant neoplasm of prostate (HCC)   Stage 3a chronic kidney disease    B heel ulcerations with at least L heel osteomyelitis -Patient with h/o poorly controlled DM (currently better) presenting with c/o B foot pain -His left heel has an ulceration with eschar, his right heel has a smaller and apparently more recent ulceration with purulent drainage -MRI already done of L heel with evidence of osteomyelitis, will also order MRI of R heel -Orthopedics recommended ID consult to recommend antibiotics; I discussed with Dr. Linus Salmons and can use the order set and check ABIs, likely to need amputation(s) if insufficient blood flow or may benefit from IV/PO antibiotics to suppress the infection if adequate blood flow and amputation is not desired by patient/family.  Formal ID consultation does not appear to be indicated at this time. -Will treat with IV antibiotics (Rocephin/Flagyl/Vanc as per the lower extremity wound infection algorithm -LE wound order set utilized including labs (CRP, ESR, A1c, prealbumin, and blood cultures) and consults (TOC team; diabetes coordinator; peripheral vascular  navigator; wound care; and nutrition) -He appears likely to need consultation by Dr. [name] on Monday -Recent hip fracture (06/2019) s/p repair  Chronic respiratory failure, on home O2 -Saw pulm for office visit on 11/24 -Started on O2 during hospital stay for hip fracture without stated diagnosis ("Sob) -Had a R basilar opacity and CT was ordered -Unable to complete PFTs due to patient inability to follow directions -O2 was continued -However, report from cards in 07/2019 reports that 07/01/19 echo showed severely elevated pulm artery pressure (not mentioned by pulmonology) - thought to be due to diastolic CHF and ?lung disease.  -12/7 chest CT with increased size of L pleural effusion, now moderate; persistent stable R pleural effusion and suspected round atelectasis in the R lung base; also with stable mediastinal LAD -This patient appears to be a good candidate for the pulmonology rounding team to consult on when this service starts on 1/4; I have placed a consult for a second opinion at that time.  If acute pulmonary need arises sooner, please call for a consult at that time. -Will order CXR  Chronic diastolic CHF/aflutter -last seen by cardiology on 11/20 -Echo in 08/2015 with preserved EF and grade 1 diastolic dysfunction; echo 07/01/19 with EF 47-82%; no diastolic dysfunction; moderate TR; severely elevated pulmonary artery pressure -Rate control with Toprol XL -Xarelto for [facility], continued for now although this may  need to be held depending on orthopedics input -Continue home Lasix  DM -Improved control, A1c 6.8 -Continue home Lantus and meal coverage -Will add moderate-scale SSI -Hold Glucophage  Stage 3a CKD -Appears to be stable at this time -Has seen nephrology in the past  HTN -Continue Norvasc, Lisinopril, Toprol XL  HLD -Continue Lipitor  Obesity -BMI 36.5 -may have some component of OHS  Prostate CA -Treated in 2018  Vascular dementia -Patient has periods of  lucency and periods of apparent confusion -It appears we would need to rely on family to assist with decisions -He has an ex-wife who lives locally and 2 daughters who live out of town  -Continue Aricept for now -Full code for now as per ex-wife   Note: This patient has been tested and is negative for the novel coronavirus COVID-19.   DVT prophylaxis: Xarelto Code Status:  Full - confirmed with family Family Communication: None present; I spoke with his ex-wife by telephone Disposition Plan: Back to SNF once clinically improved Consults called: Orthopedics and ID (both telephone only); TOC team; diabetes coordinator; peripheral vascular navigator; wound care; and nutrition Admission status: Admit - It is my clinical opinion that admission to INPATIENT is reasonable and necessary because of the expectation that this patient will require hospital care that crosses at least 2 midnights to treat this condition based on the medical complexity of the problems presented.  Given the aforementioned information, the predictability of an adverse outcome is felt to be significant.    Karmen Bongo MD [facility]   How to contact the [facility] Attending or Consulting provider [place] or covering provider during after hours [place], for this patient?  1. Check the care team in [facility] and look for a) attending/consulting [facility] provider listed and b) the [facility] team listed 2. Log into [email or web address] and use [place]'s universal password to access. If you do not have the password, please contact the hospital operator. 3. Locate the [facility] provider you are looking for under [facility] and page to a number that you can be directly reached. 4. If you still have difficulty reaching the provider, please page the [facility] (Director on Call) for the Hospitalists listed on amion for assistance.   09/10/2019, 9:57 AM

## 2019-09-10 NOTE — ED Notes (Signed)
ED TO INPATIENT HANDOFF REPORT  ED Nurse Name and Phone #:   S Name/Age/Gender Darrell Mcclain 79 y.o. male Room/Bed: 026C/026C  Code Status   Code Status: Not on file  Home/SNF/Other Nursing Home Patient oriented to: self Is this baseline? Yes   Triage Complete: Triage complete  Chief Complaint Osteomyelitis [facility]) [M86.9]  Triage Note Pt to ER by [facility] from facility for foot evaluation, he reports a wound is present and has been for 4-5 days, is currently dressed. Denies pain at this time. EMS reports poor circulation history. He is in NAD.   This Probation officer spoke with Pt's daughter with update.  CBG 90 following D50  1amp    Allergies Allergies  Allergen Reactions  . Ciprofloxacin Diarrhea  . Oxycodone Other (See Comments)    "confusion and loopy"    Level of Care/Admitting Diagnosis ED Disposition    ED Disposition Condition Comment   Admit  Hospital Area: [place] [[id number]]  Level of Care: Med-Surg [16]  Covid Evaluation: Asymptomatic Screening Protocol (No Symptoms)  Diagnosis: Osteomyelitis [facility]) [[id number]]  Admitting Physician: Rise Patience [id number]  Attending Physician: Rise Patience [phone]  Estimated length of stay: past midnight tomorrow  Certification:: I certify this patient will need inpatient services for at least 2 midnights       B Medical/Surgery History Past Medical History:  Diagnosis Date  . Anticoagulant long-term use    xarelto  . Arthritis    right hand  . At risk for sleep apnea    STOP-BANG==  6      PT ALREADY SET UP TO HAVE STUDY AT [facility] , PCP AWARE  . Atrial flutter, chronic [facility]) dx 01/ 2015   cardiologist-  dr Angelena Form  . Bilateral lower extremity edema   . BPH (benign prostatic hyperplasia)   . Cerebral microvascular disease    advanced and multiple foci throughout hemispheres and brain stem-- residual memory issues  . Chronic constipation   . Chronic kidney disease (CKD),  stage III (moderate)    nephrologist-  dr Marval Regal [facility])  . Diastolic CHF, chronic ([place])   . Exertional dyspnea   . Foley catheter in place   . GERD (gastroesophageal reflux disease)   . Glaucoma, both eyes   . History of adenomatous polyp of colon    2012  . History of CVA (cerebrovascular accident)    (pt had no symptoms other than memory issues) per MRI 11-29-2015  very small infarct right frontal lobe and multiple foci throughout hemispheres and brain stem  . 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 Hyperlipidemia   . Hypertension   . Iron deficiency anemia   . Memory loss   . Prostate cancer ([place])   . RBBB (right bundle branch block with left anterior fascicular block)   . Rosacea   . Type 2 diabetes mellitus ([place])   . Urine retention   . Vitamin B 12 deficiency    Past Surgical History:  Procedure Laterality Date  . CATARACT EXTRACTION W/ INTRAOCULAR LENS  IMPLANT, BILATERAL  2016  . IR THORACENTESIS ASP PLEURAL SPACE W/IMG GUIDE  12/01/2018  . ORIF PROXIMAL HUMEROUS FX AND BICEPS TENODESIS  11/09/2015  . OTHER SURGICAL HISTORY  1996   reconstruction left toe (amputation repair)  . PROSTATE BIOPSY N/A 07/22/2016   Procedure: BIOPSY TRANSRECTAL ULTRASONIC PROSTATE (TUBP);  Surgeon: Festus Aloe, MD;  Location: [facility];  Service: Urology;  Laterality: N/A;  . TONSILLECTOMY  1951  . TRANSTHORACIC ECHOCARDIOGRAM  08/23/2015   mild LVH, ef 55-60%, grade 1 diastolitc dysfunction  . [place]     A IV Location/Drains/Wounds Patient Lines/Drains/Airways Status   Active Line/Drains/Airways    Name:   Placement date:   Placement time:   Site:   Days:   Peripheral IV 09/10/19 Right Wrist   09/10/19    0637    Wrist   less than 1   Urethral Catheter DR ESKRIDGE Latex;Coude 20 Fr.   07/22/16    1046    Latex;Coude   1145   Incision (Closed) 07/22/16 Penis Other (Comment)   07/22/16    0756     1145           Intake/Output Last 24 hours  Intake/Output Summary (Last 24 hours) at 09/10/2019 0906 Last data filed at 09/10/2019 1443 Gross per 24 hour  Intake 50 ml  Output --  Net 50 ml    Labs/Imaging Results for orders placed or performed during the hospital encounter of 09/09/19 (from the past 48 hour(s))  Comprehensive metabolic panel     Status: Abnormal   Collection Time: 09/09/19  3:23 PM  Result Value Ref Range   Sodium 144 135 - 145 mmol/L   Potassium 4.2 3.5 - 5.1 mmol/L   Chloride 100 98 - 111 mmol/L   CO2 34 (H) 22 - 32 mmol/L   Glucose, Bld 180 (H) 70 - 99 mg/dL   BUN 22 8 - 23 mg/dL   Creatinine, Ser 1.26 (H) 0.61 - 1.24 mg/dL   Calcium 8.5 (L) 8.9 - 10.3 mg/dL   Total Protein 6.0 (L) 6.5 - 8.1 g/dL   Albumin 2.7 (L) 3.5 - 5.0 g/dL   AST 16 15 - 41 U/L   ALT 16 0 - 44 U/L   Alkaline Phosphatase 102 38 - 126 U/L   Total Bilirubin 1.1 0.3 - 1.2 mg/dL   GFR calc non Af Amer 55 (L) >60 mL/min   GFR calc Af Amer >60 >60 mL/min   Anion gap 10 5 - 15    Comment: Performed at [facility], [address], [place], [place] [address]  CBC with Differential     Status: Abnormal   Collection Time: 09/09/19  3:23 PM  Result Value Ref Range   WBC 9.7 4.0 - 10.5 K/uL   RBC 4.73 4.22 - 5.81 MIL/uL   Hemoglobin 11.7 (L) 13.0 - 17.0 g/dL   HCT 42.1 39.0 - 52.0 %   MCV 89.0 80.0 - 100.0 fL   MCH 24.7 (L) 26.0 - 34.0 pg   MCHC 27.8 (L) 30.0 - 36.0 g/dL   RDW 17.6 (H) 11.5 - 15.5 %   Platelets 255 150 - 400 K/uL   nRBC 0.0 0.0 - 0.2 %   Neutrophils Relative % 83 %   Neutro Abs 8.1 (H) 1.7 - 7.7 K/uL   Lymphocytes Relative 8 %   Lymphs Abs 0.8 0.7 - 4.0 K/uL   Monocytes Relative 7 %   Monocytes Absolute 0.6 0.1 - 1.0 K/uL   Eosinophils Relative 1 %   Eosinophils Absolute 0.1 0.0 - 0.5 K/uL   Basophils Relative 0 %   Basophils Absolute 0.0 0.0 - 0.1 K/uL   Immature Granulocytes 1 %   Abs Immature Granulocytes 0.06 0.00 - 0.07 K/uL    Comment: Performed at [facility], [place],  [place] [address]  CBG monitoring, ED     Status: Abnormal   Collection Time: 09/09/19  3:31 PM  Result Value Ref Range   Glucose-Capillary 149 (H) 70 - 99 mg/dL   Comment 1 Notify RN    Comment 2 Document in Chart   Blood culture (routine x 2)     Status: None (Preliminary result)   Collection Time: 09/09/19  3:48 PM   Specimen: BLOOD  Result Value Ref Range   Specimen Description BLOOD RIGHT ANTECUBITAL    Special Requests      BOTTLES DRAWN AEROBIC AND ANAEROBIC Blood Culture adequate volume   Culture      NO GROWTH < 24 HOURS Performed at [facility], [place] [address], [place], [place] [address]    Report Status PENDING   Sedimentation rate     Status: None   Collection Time: 09/09/19  4:05 PM  Result Value Ref Range   Sed Rate 14 0 - 16 mm/hr    Comment: Performed at [facility] [address], [place], [place] [address]  C-reactive protein     Status: Abnormal   Collection Time: 09/09/19  4:05 PM  Result Value Ref Range   CRP 3.4 (H) <1.0 mg/dL    Comment: Performed at [facility] [address], [place], [place] [address]  Hemoglobin A1c     Status: Abnormal   Collection Time: 09/09/19  4:17 PM  Result Value Ref Range   Hgb A1c MFr Bld 6.8 (H) 4.8 - 5.6 %    Comment: (NOTE) Pre diabetes:          5.7%-6.4% Diabetes:              >6.4% Glycemic control for   <7.0% adults with diabetes    Mean Plasma Glucose 148.46 mg/dL    Comment: Performed at [facility] [address], [place], [place] [address]  Blood culture (routine x 2)     Status: None (Preliminary result)   Collection Time: 09/09/19  4:37 PM   Specimen: BLOOD  Result Value Ref Range   Specimen Description BLOOD LEFT ANTECUBITAL    Special Requests      BOTTLES DRAWN AEROBIC AND ANAEROBIC Blood Culture results may not be optimal due to an inadequate volume of blood received in culture bottles   Culture      NO GROWTH < 24 HOURS Performed at  [facility] [address] [place], [place] [address]    Report Status PENDING   SARS CORONAVIRUS 2 (TAT 6-24 HRS) Nasopharyngeal Nasopharyngeal Swab     Status: None   Collection Time: 09/10/19  3:55 AM   Specimen: Nasopharyngeal Swab  Result Value Ref Range   SARS Coronavirus 2 NEGATIVE NEGATIVE    Comment: (NOTE) SARS-CoV-2 target nucleic acids are NOT DETECTED. The SARS-CoV-2 RNA is generally detectable in upper and lower respiratory specimens during the acute phase of infection. Negative results do not preclude SARS-CoV-2 infection, do not rule out co-infections with other pathogens, and should not be used as the sole basis for treatment or other patient management decisions. Negative results must be combined with clinical observations, patient history, and epidemiological information. The expected result is Negative. Fact Sheet for Patients: SugarRoll.be Fact Sheet for Healthcare Providers: https://www.woods-mathews.com/ This test is not yet approved or cleared by the [place] FDA and  has been authorized for detection and/or diagnosis of SARS-CoV-2 by FDA under an Emergency Use Authorization (  EUA). This EUA will remain  in effect (meaning this test can be used) for the duration of the COVID-19 declaration under Section 56 4(b)(1) of the Act, 21 U.S.C. section 360bbb-3(b)(1), unless the authorization is terminated or revoked sooner. Performed at [facility], [place] [address], [place], [place] [address]   POC CBG, ED     Status: Abnormal   Collection Time: 09/10/19  8:20 AM  Result Value Ref Range   Glucose-Capillary 52 (L) 70 - 99 mg/dL   Comment 1 Notify RN    Comment 2 Document in Chart    DG Orbits  Result Date: 09/10/2019 CLINICAL DATA:  Initial evaluation for MRI clearance. EXAM: ORBITS - COMPLETE 4+ VIEW COMPARISON:  None. FINDINGS: There is no evidence of fracture or other significant bone abnormality. No  orbital emphysema or sinus air-fluid levels are seen. IMPRESSION: Negative.  No metallic foreign body seen overlying either orbit. Electronically Signed   By: Jeannine Boga M.D.   On: 09/10/2019 00:47   DG Foot Complete Left  Result Date: 09/09/2019 CLINICAL DATA:  Left heel wound. Concern for osteomyelitis. Recent left hip fracture. EXAM: LEFT FOOT - COMPLETE 3+ VIEW COMPARISON:  None. FINDINGS: Presumed surgical absence of the second phalanx. Vascular calcifications. Lucency superficial to the calcaneus, likely the site of soft tissue ulcer. No soft tissue gas. No radiopaque foreign object. No osseous destruction. Diffuse soft tissue swelling about the dorsal forefoot. IMPRESSION: No plain film evidence of osteomyelitis. Forefoot soft tissue swelling. Electronically Signed   By: Abigail Miyamoto M.D.   On: 09/09/2019 17:00    Pending Labs Unresulted Labs (From admission, onward)   None      Vitals/Pain Today's Vitals   09/10/19 0819 09/10/19 0822 09/10/19 0830 09/10/19 0845  BP: (!) 153/99  (!) 153/78 (!) 143/79  Pulse:   66 61  Resp:  (!) 27 (!) 21 12  Temp:      TempSrc:      SpO2:  99% 99% 100%  PainSc:        Isolation Precautions No active isolations  Medications Medications  sodium chloride flush (NS) 0.9 % injection 3 mL (has no administration in time range)  vancomycin (VANCOREADY) IVPB 2000 mg/400 mL (has no administration in time range)  piperacillin-tazobactam (ZOSYN) IVPB 3.375 g (0 g Intravenous Stopped 09/10/19 0821)  vancomycin (VANCOREADY) IVPB 1250 mg/250 mL (has no administration in time range)  dextrose 50 % solution (has no administration in time range)  gadobutrol (GADAVIST) 1 MMOL/ML injection 10 mL (10 mLs Intravenous Contrast Given 09/10/19 0301)  dextrose 50 % solution 50 mL (50 mLs Intravenous Given 09/10/19 0832)    Mobility manual wheelchair Moderate fall risk   Focused Assessments Cardiac Assessment Handoff:    No results found for: CKTOTAL,  CKMB, CKMBINDEX, TROPONINI No results found for: DDIMER Does the Patient currently have chest pain? No      R Recommendations: See Admitting Provider Note  Report given to:   Additional Notes:

## 2019-09-10 NOTE — Progress Notes (Signed)
Pharmacy Antibiotic Note  Darrell Mcclain is a 79 y.o. male admitted on 09/09/2019 with osteomyelitis.  Pharmacy has been consulted for Vancomycin/Zosyn dosing. WBC WNL. Scr 1.26. Preliminary MRI report with osteomyelitis of the heel.   Plan: Vancomycin 2000 mg IV x 1, then 1250 mg IV q24h >>Estimated AUC: 468 Zosyn 3.375G IV q8h to be infused over 4 hours Trend WBC, temp, renal function  F/U infectious work-up Drug levels as indicated  Temp (24hrs), Avg:97.9 F (36.6 C), Min:97.9 F (36.6 C), Max:97.9 F (36.6 C)  Recent Labs  Lab 09/09/19 1523  WBC 9.7  CREATININE 1.26*    CrCl cannot be calculated (Unknown ideal weight.).    Allergies  Allergen Reactions  . Ciprofloxacin Diarrhea  . Oxycodone Other (See Comments)    "confusion and loopy"    Narda Bonds, PharmD, BCPS Clinical Pharmacist Phone: [phone]

## 2019-09-10 NOTE — Consult Note (Signed)
[facility] Nurse wound consult note Consultation was completed by review of records, images and assistance from the bedside nurse/clinical staff.    Reason for Consult: wounds Wound type: 1. Stage 3 Pressure Injury; right heel; 95% pink, clean/5% area of dark tissue 2. Unstageable Pressure Injury; left heel; 100% black eschar Pressure Injury POA: Yes Measurement: see nursing flow sheet Wound bed: see above  Drainage (amount, consistency, odor) see nursing flow sheet Periwound: intact Dressing procedure/placement/frequency: Topical care ordered based on review of orthopedic notes and the presence of osteomyelitis  in the left calcaneous.  Added Prevalon boots for offloading heels.   Re consult if needed, will not follow at this time. Thanks  Bernetha Anschutz [employer], RN,CWOCN, CNS, [place] [phone])

## 2019-09-11 ENCOUNTER — Inpatient Hospital Stay (HOSPITAL_COMMUNITY): Payer: Medicare Other

## 2019-09-11 DIAGNOSIS — I96 Gangrene, not elsewhere classified: Secondary | ICD-10-CM

## 2019-09-11 LAB — BASIC METABOLIC PANEL
Anion gap: 7 (ref 5–15)
BUN: 25 mg/dL — ABNORMAL HIGH (ref 8–23)
CO2: 33 mmol/L — ABNORMAL HIGH (ref 22–32)
Calcium: 8.3 mg/dL — ABNORMAL LOW (ref 8.9–10.3)
Chloride: 103 mmol/L (ref 98–111)
Creatinine, Ser: 1.18 mg/dL (ref 0.61–1.24)
GFR calc Af Amer: >60 mL/min (ref >60)
GFR calc non Af Amer: 60 mL/min — ABNORMAL LOW (ref >60)
Glucose, Bld: 181 mg/dL — ABNORMAL HIGH (ref 70–99)
Potassium: 3.7 mmol/L (ref 3.5–5.1)
Sodium: 143 mmol/L (ref 135–145)

## 2019-09-11 LAB — GLUCOSE, CAPILLARY
Glucose-Capillary: 115 mg/dL — ABNORMAL HIGH (ref 70–99)
Glucose-Capillary: 114 mg/dL — ABNORMAL HIGH (ref 70–99)
Glucose-Capillary: 92 mg/dL (ref 70–99)
Glucose-Capillary: 144 mg/dL — ABNORMAL HIGH (ref 70–99)

## 2019-09-11 LAB — CBC
HCT: 37 % — ABNORMAL LOW (ref 39.0–52.0)
Hemoglobin: 10.4 g/dL — ABNORMAL LOW (ref 13.0–17.0)
MCH: 24.6 pg — ABNORMAL LOW (ref 26.0–34.0)
MCHC: 28.1 g/dL — ABNORMAL LOW (ref 30.0–36.0)
MCV: 87.7 fL (ref 80.0–100.0)
Platelets: 220 10*3/uL (ref 150–400)
RBC: 4.22 MIL/uL (ref 4.22–5.81)
RDW: 18 % — ABNORMAL HIGH (ref 11.5–15.5)
WBC: 9.1 10*3/uL (ref 4.0–10.5)
nRBC: 0 % (ref 0.0–0.2)

## 2019-09-11 MED ORDER — VANCOMYCIN HCL IN DEXTROSE 1-5 GM/200ML-% IV SOLN
1000.0000 mg | INTRAVENOUS | Status: DC
  Administered 2019-09-11 – 2019-09-13 (×3): 1000 mg via INTRAVENOUS
  Filled 2019-09-11 (×4): qty 200

## 2019-09-11 NOTE — Progress Notes (Signed)
Patient ID: Darrell Mcclain, male   DOB: 1941-01-24, 79 y.o.   MRN: [id number]  PROGRESS NOTE    Naksh Radi  FKC:[id number] DOB: 02-Dec-1940 DOA: 09/09/2019 PCP: Burnard Bunting, MD   Brief Narrative:  79 year old male with history of diabetes mellitus type II, prostate cancer, dementia, hypertension, hyperlipidemia, unspecified CVA, BPH with history of chronic indwelling Foley (not currently present), chronic diastolic CHF, chronic kidney disease stage III, chronic respiratory failure on home oxygen, atrial flutter on Xarelto presented with foot infection.  MRI of the left foot showed osteomyelitis.  He was started on broad-spectrum antibiotics.  Orthopedics was consulted.  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  Orthopedics following and will decide timing for surgical intervention. -Follow ABI. -Continue broad-spectrum antibiotics.  Pain management.  Chronic hypoxic respiratory failure on home oxygen -Follows up with pulmonary as an outpatient.  08/15/2019: CT chest had shown increased size of left pleural effusion, now moderate; persistent stable right pleural effusion and suspect now atelectasis in the right lung base also with stable mediastinal LAD -Chest x-ray on admission showed slight increase in right loculated moderate pleural effusion.  Continue Lasix.  Might need pulmonary evaluation at some point.  Chronic diastolic CHF Paroxysmal atrial flutter -Echo on 07/01/2019 showed EF of 50 to 55% with moderate TR and severely elevated pulmonary artery pressure -Currently rate controlled with Toprol-XL.  Will hold Xarelto because of need for surgical intervention. -Strict input and output.  Daily weights.  Increase Lasix to twice a day.  Diabetes mellitus type 2 -A1c 6.8.  Continue Lantus with CBGs with SSI.  Glucophage on hold  Chronic kidney disease stage IIIa -Stable.   Monitor  Hypertension--continue Norvasc, lisinopril and Toprol-XL  Hyperlipidemia -Continue Lipitor  Obesity -Outpatient follow-up  History of prostate cancer -Treated in 2018  Vascular dementia -Intermittently gets confused, mostly at night -Currently alert awake oriented.  Discussed CODE STATUS with the patient and he wants to be a DNR -We will request palliative care consultation for goals of care discussion -Continue Aricept -Fall precautions.  Monitor mental status    DVT prophylaxis: We will discontinue Xarelto Code Status: DNR Family Communication: Spoke to patient at bedside Disposition Plan: Depends on clinical outcome  Consultants: Orthopedics  Procedures: None  Antimicrobials: Cefepime Flagyl and vancomycin from 09/10/2019 onwards  Subjective: Patient seen and examined at bedside.  He is awake and alert but poor historian.  Denies worsening leg pain.  No overnight fever or vomiting reported.  Objective: Vitals:   09/10/19 1721 09/10/19 2021 09/11/19 0431 09/11/19 0841  BP: (!) 134/56 131/63 125/62 119/71  Pulse: 76 79 63 61  Resp: 18 18 17 15   Temp: 97.9 F (36.6 C) 98.2 F (36.8 C) 98.5 F (36.9 C) 97.6 F (36.4 C)  TempSrc: Oral Oral  Oral  SpO2: 98% 92% 97% 94%  Weight:      Height:        Intake/Output Summary (Last 24 hours) at 09/11/2019 1019 Last data filed at 09/11/2019 0448 Gross per 24 hour  Intake 272.78 ml  Output 600 ml  Net -327.22 ml   Filed Weights   09/10/19 0944  Weight: 95 kg    Examination:  General exam: Appears calm and comfortable.  Awake, alert and oriented.  Poor historian.  Slow to respond to questions. Respiratory system: Bilateral decreased breath sounds at bases Cardiovascular system: S1 & S2 heard, Rate controlled Gastrointestinal system: Abdomen is nondistended,  soft and nontender. Normal bowel sounds heard. Extremities: No cyanosis, clubbing; left foot dressing present   Data Reviewed: I have personally  reviewed following labs and imaging studies  CBC: Recent Labs  Lab 09/09/19 1523 09/11/19 0339  WBC 9.7 9.1  NEUTROABS 8.1*  --   HGB 11.7* 10.4*  HCT 42.1 37.0*  MCV 89.0 87.7  PLT 255 144   Basic Metabolic Panel: Recent Labs  Lab 09/09/19 1523 09/11/19 0339  NA 144 143  K 4.2 3.7  CL 100 103  CO2 34* 33*  GLUCOSE 180* 181*  BUN 22 25*  CREATININE 1.26* 1.18  CALCIUM 8.5* 8.3*   GFR: Estimated Creatinine Clearance: 58.5 mL/min (by C-G formula based on SCr of 1.18 mg/dL). Liver Function Tests: Recent Labs  Lab 09/09/19 1523  AST 16  ALT 16  ALKPHOS 102  BILITOT 1.1  PROT 6.0*  ALBUMIN 2.7*   No results for input(s): LIPASE, AMYLASE in the last 168 hours. No results for input(s): AMMONIA in the last 168 hours. Coagulation Profile: No results for input(s): INR, PROTIME in the last 168 hours. Cardiac Enzymes: No results for input(s): CKTOTAL, CKMB, CKMBINDEX, TROPONINI in the last 168 hours. BNP (last 3 results) No results for input(s): PROBNP in the last 8760 hours. HbA1C: Recent Labs    09/09/19 1617  HGBA1C 6.8*   CBG: Recent Labs  Lab 09/10/19 0905 09/10/19 1128 09/10/19 1731 09/10/19 2141 09/11/19 0631  GLUCAP 90 85 98 303* 115*   Lipid Profile: No results for input(s): CHOL, HDL, LDLCALC, TRIG, CHOLHDL, LDLDIRECT in the last 72 hours. Thyroid Function Tests: No results for input(s): TSH, T4TOTAL, FREET4, T3FREE, THYROIDAB in the last 72 hours. Anemia Panel: No results for input(s): VITAMINB12, FOLATE, FERRITIN, TIBC, IRON, RETICCTPCT in the last 72 hours. Sepsis Labs: No results for input(s): PROCALCITON, LATICACIDVEN in the last 168 hours.  Recent Results (from the past 240 hour(s))  Blood culture (routine x 2)     Status: None (Preliminary result)   Collection Time: 09/09/19  3:48 PM   Specimen: BLOOD  Result Value Ref Range Status   Specimen Description BLOOD RIGHT ANTECUBITAL  Final   Special Requests   Final    BOTTLES DRAWN  AEROBIC AND ANAEROBIC Blood Culture adequate volume   Culture   Final    NO GROWTH 2 DAYS Performed at [facility], [address] [place], [place] [address]    Report Status PENDING  Incomplete  Blood culture (routine x 2)     Status: None (Preliminary result)   Collection Time: 09/09/19  4:37 PM   Specimen: BLOOD  Result Value Ref Range Status   Specimen Description BLOOD LEFT ANTECUBITAL  Final   Special Requests   Final    BOTTLES DRAWN AEROBIC AND ANAEROBIC Blood Culture results may not be optimal due to an inadequate volume of blood received in culture bottles   Culture   Final    NO GROWTH 2 DAYS Performed at [facility], [place] [address] [place], [place] [address]    Report Status PENDING  Incomplete  SARS CORONAVIRUS 2 (TAT 6-24 HRS) Nasopharyngeal Nasopharyngeal Swab     Status: None   Collection Time: 09/10/19  3:55 AM   Specimen: Nasopharyngeal Swab  Result Value Ref Range Status   SARS Coronavirus 2 NEGATIVE NEGATIVE Final    Comment: (NOTE) SARS-CoV-2 target nucleic acids are NOT DETECTED. The SARS-CoV-2 RNA is generally detectable in upper and lower respiratory specimens during the  acute phase of infection. Negative results do not preclude SARS-CoV-2 infection, do not rule out co-infections with other pathogens, and should not be used as the sole basis for treatment or other patient management decisions. Negative results must be combined with clinical observations, patient history, and epidemiological information. The expected result is Negative. Fact Sheet for Patients: SugarRoll.be Fact Sheet for Healthcare Providers: https://www.woods-mathews.com/ This test is not yet approved or cleared by the [place] FDA and  has been authorized for detection and/or diagnosis of SARS-CoV-2 by FDA under an Emergency Use Authorization (EUA). This EUA will remain  in effect (meaning this test can be used) for the  duration of the COVID-19 declaration under Section 56 4(b)(1) of the Act, 21 U.S.C. section 360bbb-3(b)(1), unless the authorization is terminated or revoked sooner. Performed at [facility], [place] [address], [place], [place] [address]   MRSA PCR Screening     Status: None   Collection Time: 2019/09/29  1:05 PM   Specimen: Nasal Mucosa; Nasopharyngeal  Result Value Ref Range Status   MRSA by PCR NEGATIVE NEGATIVE Final    Comment:        The GeneXpert MRSA Assay (FDA approved for NASAL specimens only), is one component of a comprehensive MRSA colonization surveillance program. It is not intended to diagnose MRSA infection nor to guide or monitor treatment for MRSA infections. Performed at [facility], [place] [address], [place], [place] [address]          Radiology Studies: DG Orbits  Result Date: 2019-09-29 CLINICAL DATA:  Initial evaluation for MRI clearance. EXAM: ORBITS - COMPLETE 4+ VIEW COMPARISON:  None. FINDINGS: There is no evidence of fracture or other significant bone abnormality. No orbital emphysema or sinus air-fluid levels are seen. IMPRESSION: Negative.  No metallic foreign body seen overlying either orbit. Electronically Signed   By: Jeannine Boga M.D.   On: 2019-09-29 00:47   MR HEEL LEFT W WO CONTRAST  Result Date: 2019-09-29 CLINICAL DATA:  Left heel wound, diabetes EXAM: MRI OF LOWER LEFT EXTREMITY WITHOUT AND WITH CONTRAST TECHNIQUE: Multiplanar, multisequence MR imaging of the left hindfoot was performed both before and after administration of intravenous contrast. CONTRAST:  5mL GADAVIST GADOBUTROL 1 MMOL/ML IV SOLN COMPARISON:  X-ray 09/09/2019 FINDINGS: Bones/Joint/Cartilage Loss of cortical definition of the posterolateral aspect of the left calcaneus with extensive bone marrow edema and enhancement. There is confluent low T1 signal compatible with acute osteomyelitis (series 6, images 24-28; series 12, image 14). The abnormal marrow  signal is confined to the posterolateral calcaneus and measures up to 1 cm in AP depth with a thin amount of abnormal signal extending along the lateral aspect of the posterior calcaneus closely approximating the peroneal tubercle (series 4, image 24). There is involvement of the lateral most aspect of the distal Achilles tendon insertion (series 4, image 23) as well as the attachment of the plantar fascia (series 13, image 14). The remaining marrow signal of the calcaneal body is preserved. Remaining marrow structures of the hindfoot are intact. There are no fractures. Scattered degenerative changes involving the midfoot and subtalar joints. No talar dome chondral defect. No tibiotalar or subtalar joint effusion. Ligaments Medial and lateral ankle ligaments are grossly intact. Muscles and Tendons Diffuse intramuscular edema which may reflect denervation changes versus myositis. Achilles tendon intact. Peroneal tendons, posteromedial ankle tendons, and anterior ankle tendons are intact. Plantar fascia remains intact. Soft tissues Large soft tissue ulceration overlying the posterolateral aspect of the calcaneus  closely approximating the underlying bone. There is poorly enhancing tissue in this region compatible with tissue necrosis (series 19, image 17). Marked circumferential subcutaneous edema without a well-defined fluid collection. IMPRESSION: 1. 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 Abnormal marrow signal involves the lateral most aspect of the distal Achilles tendon insertion as well as the attachment of the plantar fascia. 2. Marked circumferential subcutaneous edema without a well-defined fluid collection. 3. Diffuse intramuscular edema which may reflect denervation changes versus myositis. Electronically Signed   By: Davina Poke D.O.   On: 09/10/2019 09:13   MR HEEL RIGHT W WO  CONTRAST  Result Date: 09/10/2019 CLINICAL DATA:  Right heel ulcer, diabetic, peripheral vascular disease EXAM: MR OF THE RIGHT HEEL WITHOUT AND WITH CONTRAST TECHNIQUE: Multiplanar, multisequence MR imaging of the right heel was performed both before and after administration of intravenous contrast. CONTRAST:  57mL GADAVIST GADOBUTROL 1 MMOL/ML IV SOLN COMPARISON:  None available. FINDINGS: Bones/Joint/Cartilage No acute fracture or malalignment. Small os trigonum with associated marrow edema. Remaining visualized marrow structures are within normal limits without abnormal bone marrow signal. No cortical destruction. Tibiotalar joint is intact without chondral defect or joint effusion. Subtalar joints are intact without chondral defect or joint effusion. Ligaments The medial and lateral ankle ligaments are intact. Lisfranc ligament intact. Muscles and Tendons Diffuse muscular atrophy and fatty infiltration. Diffuse edema like signal throughout the visualized musculature suggesting denervation changes or myositis. Achilles tendon intact. Peroneal tendons intact. Posteromedial and anterior ankle tendons are intact. Soft tissues There are foci of metallic susceptibility artifact adjacent to the plantar cortex of the posterior calcaneus (series 5, image 10). Circumferential soft tissue edema and enhancement suggesting cellulitis. No deep soft tissue ulceration. No well-defined or drainable soft tissue fluid collection. IMPRESSION: 1. Circumferential soft tissue edema and enhancement suggesting cellulitis. No well-defined or drainable soft tissue fluid collection. 2. No evidence of osteomyelitis. 3. Os trigonum with associated mild marrow edema, which can be seen in the setting of posterior ankle impingement. 4. Diffuse edema-like signal throughout the visualized musculature suggesting denervation changes or myositis. 5. Small foci of susceptibility artifact adjacent to the plantar cortex of the posterior calcaneus,  suggesting small metallic foreign bodies. Radiographic correlation would be helpful for further evaluation if clinically indicated. Electronically Signed   By: Davina Poke D.O.   On: 09/10/2019 17:25   DG CHEST PORT 1 VIEW  Result Date: 09/10/2019 CLINICAL DATA:  Right pleural effusion. EXAM: PORTABLE CHEST 1 VIEW COMPARISON:  Chest radiograph 08/20/2019 FINDINGS: Stable cardiomediastinal contours with enlarged heart size. Mild diffuse bilateral interstitial opacities. Stable to slight increase in size of a loculated moderate right pleural effusion. Persistent right basilar opacity. Mild opacities at the left lung base may reflect atelectasis and or infiltrate with likely small volume pleural fluid. No evidence of pneumothorax IMPRESSION: 1. Stable to slight increase in size of a loculated moderate right pleural effusion. 2. Diffuse mild interstitial opacities suspect mild edema. 3. Persistent bibasilar pulmonary opacities. Electronically Signed   By: Audie Pinto M.D.   On: 09/10/2019 11:43   DG Foot Complete Left  Result Date: 09/09/2019 CLINICAL DATA:  Left heel wound. Concern for osteomyelitis. Recent left hip fracture. EXAM: LEFT FOOT - COMPLETE 3+ VIEW COMPARISON:  None. FINDINGS: Presumed surgical absence of the second phalanx. Vascular calcifications. Lucency superficial to the calcaneus, likely the site of soft tissue ulcer. No soft tissue gas. No radiopaque foreign  object. No osseous destruction. Diffuse soft tissue swelling about the dorsal forefoot. IMPRESSION: No plain film evidence of osteomyelitis. Forefoot soft tissue swelling. Electronically Signed   By: Abigail Miyamoto M.D.   On: 09/09/2019 17:00        Scheduled Meds: . amLODipine  5 mg Oral Daily  . atorvastatin  20 mg Oral QPM  . docusate sodium  100 mg Oral BID  . donepezil  10 mg Oral QHS  . dorzolamide  1 drop Both Eyes BID  . feeding supplement (GLUCERNA SHAKE)  237 mL Oral TID BM  . finasteride  5 mg Oral QPM  .  furosemide  40 mg Oral q1800  . furosemide  80 mg Oral Daily  . insulin aspart  0-15 Units Subcutaneous TID WC  . insulin aspart  0-5 Units Subcutaneous QHS  . insulin aspart  6 Units Subcutaneous TID AC  . insulin glargine  40 Units Subcutaneous QHS  . lisinopril  10 mg Oral Daily  . metoprolol succinate  25 mg Oral Q breakfast  . multivitamin  1 tablet Oral Daily  . nutrition supplement (JUVEN)  1 packet Oral BID BM  . pantoprazole  40 mg Oral Daily  . rivaroxaban  20 mg Oral Q supper  . tamsulosin  0.4 mg Oral Q breakfast   Continuous Infusions: . cefTRIAXone (ROCEPHIN)  IV 2 g (09/11/19 0850)  . metronidazole 500 mg (09/11/19 0851)  . vancomycin            Aline August, MD [facility] 09/11/2019, 10:19 AM

## 2019-09-11 NOTE — Plan of Care (Signed)

## 2019-09-11 NOTE — Progress Notes (Signed)
Pharmacy Antibiotic Note  Darrell Mcclain is a 79 y.o. male admitted on 09/09/2019 with gangrenous ulcer and osteo on L heel. Pharmacy has been consulted for Vancomycin dosing. Pt is also on ceftriaxone and Flagyl per MD.  Pt is afebrile, WBC WNL. Scr 1.18. Pt will likely need surgical intervention this week.  Plan: Adjust vancomycin dose to 1000 mg IV q24h (Estimated AUC 460, SCr used 1.18) Continue ceftriaxone 2 gm IV q24h Continue Flagyl 500 mg IV q8h Monitor cultures, renal function, and clinical improvement Vanc levels as needed F/u surgical plans and abx plan post-op   Temp (24hrs), Avg:98.1 F (36.7 C), Min:97.6 F (36.4 C), Max:98.5 F (36.9 C)  Recent Labs  Lab 09/09/19 1523 09/11/19 0339  WBC 9.7 9.1  CREATININE 1.26* 1.18    Estimated Creatinine Clearance: 58.5 mL/min (by C-G formula based on SCr of 1.18 mg/dL).    Allergies  Allergen Reactions  . Ciprofloxacin Diarrhea  . Oxycodone Other (See Comments)    Altered mental status    Antimicrobials this admission: 1/2 Vanc >> 1/2 CTX>> 1/2 Flagyl >> 1/2 Zosyn x 1 dose  Microbiology this admission:  1/2 COVID negative 1/2 MRSA PCR negative 1/1 BCx NG2D  Berenice Bouton, PharmD PGY1 Pharmacy Resident  Please check AMION for all [place] phone numbers After 10:00 PM, call [facility] [phone] 09/11/2019  8:59 AM

## 2019-09-11 NOTE — Progress Notes (Signed)
VASCULAR LAB PRELIMINARY  PRELIMINARY  PRELIMINARY  PRELIMINARY  ABIs completed.    Preliminary report:  See CV proc for preliminary results.  Javier Mamone, RVT 09/11/2019, 1:29 PM

## 2019-09-11 NOTE — Consult Note (Signed)
HPI: 79 year old male with left heel gangrenous ulcer. Patient diabetic with insensate foot. States he only has pain with movement of foot.   PE: General : Awake and alert. Appears comfortable.  Left foot in PRAFO. Dressings clean and dry.  Positive odor.   Plan:  Ankle -brachial indices as ordered. Surgical intervention this coming week with Dr. [name].

## 2019-09-12 ENCOUNTER — Inpatient Hospital Stay (HOSPITAL_COMMUNITY): Payer: Medicare Other

## 2019-09-12 ENCOUNTER — Other Ambulatory Visit: Payer: Self-pay | Admitting: Physician Assistant

## 2019-09-12 DIAGNOSIS — I4892 Unspecified atrial flutter: Secondary | ICD-10-CM

## 2019-09-12 DIAGNOSIS — J90 Pleural effusion, not elsewhere classified: Secondary | ICD-10-CM

## 2019-09-12 DIAGNOSIS — N1831 Chronic kidney disease, stage 3a: Secondary | ICD-10-CM

## 2019-09-12 DIAGNOSIS — I5032 Chronic diastolic (congestive) heart failure: Secondary | ICD-10-CM

## 2019-09-12 LAB — BODY FLUID CELL COUNT WITH DIFFERENTIAL
Eos, Fluid: 0 %
Lymphs, Fluid: 99 %
Monocyte-Macrophage-Serous Fluid: 1 % — ABNORMAL LOW (ref 50–90)
Neutrophil Count, Fluid: 0 % (ref 0–25)
Total Nucleated Cell Count, Fluid: 26 cu mm (ref 0–1000)

## 2019-09-12 LAB — CBC WITH DIFFERENTIAL/PLATELET
Abs Immature Granulocytes: 0.03 10*3/uL (ref 0.00–0.07)
Basophils Absolute: 0.1 10*3/uL (ref 0.0–0.1)
Basophils Relative: 1 %
Eosinophils Absolute: 0.1 10*3/uL (ref 0.0–0.5)
Eosinophils Relative: 2 %
HCT: 39.3 % (ref 39.0–52.0)
Hemoglobin: 11 g/dL — ABNORMAL LOW (ref 13.0–17.0)
Immature Granulocytes: 0 %
Lymphocytes Relative: 10 %
Lymphs Abs: 0.8 10*3/uL (ref 0.7–4.0)
MCH: 24.7 pg — ABNORMAL LOW (ref 26.0–34.0)
MCHC: 28 g/dL — ABNORMAL LOW (ref 30.0–36.0)
MCV: 88.3 fL (ref 80.0–100.0)
Monocytes Absolute: 0.7 10*3/uL (ref 0.1–1.0)
Monocytes Relative: 8 %
Neutro Abs: 6.4 10*3/uL (ref 1.7–7.7)
Neutrophils Relative %: 79 %
Platelets: 214 10*3/uL (ref 150–400)
RBC: 4.45 MIL/uL (ref 4.22–5.81)
RDW: 18.1 % — ABNORMAL HIGH (ref 11.5–15.5)
WBC: 8.1 10*3/uL (ref 4.0–10.5)
nRBC: 0 % (ref 0.0–0.2)

## 2019-09-12 LAB — BASIC METABOLIC PANEL
Anion gap: 8 (ref 5–15)
BUN: 32 mg/dL — ABNORMAL HIGH (ref 8–23)
CO2: 32 mmol/L (ref 22–32)
Calcium: 8.3 mg/dL — ABNORMAL LOW (ref 8.9–10.3)
Chloride: 103 mmol/L (ref 98–111)
Creatinine, Ser: 1.31 mg/dL — ABNORMAL HIGH (ref 0.61–1.24)
GFR calc Af Amer: >60 mL/min (ref >60)
GFR calc non Af Amer: 53 mL/min — ABNORMAL LOW (ref >60)
Glucose, Bld: 150 mg/dL — ABNORMAL HIGH (ref 70–99)
Potassium: 3.6 mmol/L (ref 3.5–5.1)
Sodium: 143 mmol/L (ref 135–145)

## 2019-09-12 LAB — LACTATE DEHYDROGENASE, PLEURAL OR PERITONEAL FLUID: LD, Fluid: 35 U/L — ABNORMAL HIGH (ref 3–23)

## 2019-09-12 LAB — GLUCOSE, CAPILLARY
Glucose-Capillary: 92 mg/dL (ref 70–99)
Glucose-Capillary: 75 mg/dL (ref 70–99)
Glucose-Capillary: 119 mg/dL — ABNORMAL HIGH (ref 70–99)
Glucose-Capillary: 85 mg/dL (ref 70–99)

## 2019-09-12 LAB — PROTEIN, PLEURAL OR PERITONEAL FLUID: Total protein, fluid: <3 g/dL

## 2019-09-12 LAB — GLUCOSE, PLEURAL OR PERITONEAL FLUID: Glucose, Fluid: 95 mg/dL

## 2019-09-12 LAB — MAGNESIUM: Magnesium: 1.9 mg/dL (ref 1.7–2.4)

## 2019-09-12 LAB — C-REACTIVE PROTEIN: CRP: 4.5 mg/dL — ABNORMAL HIGH (ref <1.0)

## 2019-09-12 MED ORDER — HEPARIN SODIUM (PORCINE) 5000 UNIT/ML IJ SOLN
5000.0000 [IU] | Freq: Three times a day (TID) | INTRAMUSCULAR | Status: DC
  Administered 2019-09-12 – 2019-09-18 (×16): 5000 [IU] via SUBCUTANEOUS
  Filled 2019-09-12 (×17): qty 1

## 2019-09-12 MED ORDER — HEPARIN SODIUM (PORCINE) 5000 UNIT/ML IJ SOLN: 5000.0000 [IU] | Freq: Three times a day (TID) | INTRAMUSCULAR | Status: DC

## 2019-09-12 NOTE — Consult Note (Signed)
NAME:  Darrell Mcclain, MRN:  [id number], DOB:  [birth year], LOS: 2 ADMISSION DATE:  09/09/2019, CONSULTATION DATE:  09/12/2019 REFERRING MD: Dr. Starla Link, CHIEF COMPLAINT:  Pleural effusion    Brief History   79 year old male past medical history type 2 diabetes, prostate cancer, hypertension, hyperlipidemia, CVA, history of chronic indwelling Foley, chronic diastolic heart failure, chronic kidney disease chronic respiratory failure on home oxygen therapy, atrial flutter on Xarelto presented with left-sided foot osteomyelitis started on broad-spectrum antibiotics.  Pulmonary was consulted for recommendations and management of chronic right-sided pleural effusion.  History of present illness   79 year old gentleman past medical history of type 2 diabetes, prostate cancer, hypertension, hyperlipidemia, CVA, history of chronic indwelling Foley, chronic diastolic heart failure, chronic kidney disease chronic respiratory failure on home oxygen therapy, atrial flutter on Xarelto admitted to the hospital for left-sided foot osteomyelitis.  Started on broad-spectrum antibiotics.  Patient has multiple hospital admissions.  He has also had a chronic right-sided pleural effusion greater than the left for several months now.  Past Medical History   Past Medical History:  Diagnosis Date  . Anticoagulant long-term use    xarelto  . Arthritis    right hand  . At risk for sleep apnea    STOP-BANG==  6      PT ALREADY SET UP TO HAVE STUDY AT [facility] , PCP AWARE  . Atrial flutter, chronic [facility]) dx 01/ 2015   cardiologist-  dr Angelena Form  . Bilateral lower extremity edema   . BPH (benign prostatic hyperplasia)   . Cerebral microvascular disease    advanced and multiple foci throughout hemispheres and brain stem-- residual memory issues  . Chronic constipation   . Chronic kidney disease (CKD), stage III (moderate)    nephrologist-  dr Marval Regal [facility])  . Diastolic CHF, chronic ([place])     . Exertional dyspnea   . Foley catheter in place   . GERD (gastroesophageal reflux disease)   . Glaucoma, both eyes   . History of adenomatous polyp of colon    2012  . History of CVA (cerebrovascular accident)    (pt had no symptoms other than memory issues) per MRI 11-29-2015  very small infarct right frontal lobe and multiple foci throughout hemispheres and brain stem  . 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 Hyperlipidemia   . Hypertension   . Iron deficiency anemia   . Memory loss   . Prostate cancer ([place])   . RBBB (right bundle branch block with left anterior fascicular block)   . Rosacea   . Type 2 diabetes mellitus ([place])   . Urine retention   . Vitamin B 12 deficiency      Significant Hospital Events     Consults:  Ortho  Procedures:  None   Significant Diagnostic Tests:  Chest x-ray 09/10/2019: Moderate size loculated right-sided pleural effusion The patient's images have been independently reviewed by me.    Micro Data:  Covid negative 09/09/2019: Blood cultures no growth to date MRSA PCR negative  Antimicrobials:  Vancomycin Ceftriaxone Metronidazole  Interim history/subjective:  See HPI above  Objective   Blood pressure (!) 102/58, pulse 83, temperature (!) 97.5 F (36.4 C), temperature source Oral, resp. rate 18, height 5\' 7"  (1.702 m), weight 95 kg, SpO2 91 %.        Intake/Output Summary (Last 24 hours) at 09/12/2019 1232 Last data filed at 09/12/2019 1100 Gross per 24 hour  Intake 887.26  ml  Output 1850 ml  Net -962.74 ml   Filed Weights   09/10/19 0944  Weight: 95 kg    Examination: General: Elderly male, chronically debilitated resting in bed no distress HENT: NCAT sclera clear, tracking appropriately Lungs: Diminished breath sounds near absent in the right base as compared to the left, no crackles Cardiovascular: Regular rate rhythm, S1-S2 no MRG Abdomen: Overly obese pannus, soft nontender Extremities: Lateral lower  extremity edema Neuro: Awake alert following commands, alert to person place does not know time, moves all 4 extremities spontaneously GU: Deferred  Resolved Hospital Problem list     Assessment & Plan:   Loculated right-sided pleural effusion. This appears to be present dating back at least since February 2020. Patient did have thoracentesis by interventional radiology in March 2020 Pleural fluid analysis at this time was exudative, lymphocyte predominant, 1100 total nucleated cells cytology had lymphocytosis but no malignancy identified. Plan: Patient has been off of Xarelto since admission. We will plan for bedside thoracentesis today. Send full pleural fluid studies, including repeat cytology and flow cytometry We will also send triglyceride levels as last effusion was lymphocyte predominant.. Patient was consented for thoracentesis.  All questions were answered. Witnessed by bedside nursing staff. Please see separate procedure note to follow. Once pleural effusion is drained would recommend sending for Noncontrasted CT of the chest.  Chronic hypoxemic respiratory failure Likely related to underlying chronic diastolic heart failure History of tobacco abuse, quit greater than 35 years ago.  Hypertension Diabetes Per primary   Labs   CBC: Recent Labs  Lab 09/09/19 1523 09/11/19 0339 09/12/19 0246  WBC 9.7 9.1 8.1  NEUTROABS 8.1*  --  6.4  HGB 11.7* 10.4* 11.0*  HCT 42.1 37.0* 39.3  MCV 89.0 87.7 88.3  PLT 255 220 778    Basic Metabolic Panel: Recent Labs  Lab 09/09/19 1523 09/11/19 0339 09/12/19 0246  NA 144 143 143  K 4.2 3.7 3.6  CL 100 103 103  CO2 34* 33* 32  GLUCOSE 180* 181* 150*  BUN 22 25* 32*  CREATININE 1.26* 1.18 1.31*  CALCIUM 8.5* 8.3* 8.3*  MG  --   --  1.9   GFR: Estimated Creatinine Clearance: 52.7 mL/min (A) (by C-G formula based on SCr of 1.31 mg/dL (H)). Recent Labs  Lab 09/09/19 1523 09/11/19 0339 09/12/19 0246  WBC 9.7 9.1  8.1    Liver Function Tests: Recent Labs  Lab 09/09/19 1523  AST 16  ALT 16  ALKPHOS 102  BILITOT 1.1  PROT 6.0*  ALBUMIN 2.7*   No results for input(s): LIPASE, AMYLASE in the last 168 hours. No results for input(s): AMMONIA in the last 168 hours.  ABG    Component Value Date/Time   TCO2 27 07/22/2016 0830     Coagulation Profile: No results for input(s): INR, PROTIME in the last 168 hours.  Cardiac Enzymes: No results for input(s): CKTOTAL, CKMB, CKMBINDEX, TROPONINI in the last 168 hours.  HbA1C: Hgb A1c MFr Bld  Date/Time Value Ref Range Status  09/09/2019 04:17 PM 6.8 (H) 4.8 - 5.6 % Final    Comment:    (NOTE) Pre diabetes:          5.7%-6.4% Diabetes:              >6.4% Glycemic control for   <7.0% adults with diabetes     CBG: Recent Labs  Lab 09/11/19 1125 09/11/19 1617 09/11/19 2051 09/12/19 0633 09/12/19 1151  GLUCAP 114* 92 144*  92 75    Review of Systems:   Review of Systems  Constitutional: Negative for chills, fever, malaise/fatigue and weight loss.  HENT: Negative for hearing loss, sore throat and tinnitus.   Eyes: Negative for blurred vision and double vision.  Respiratory: Positive for shortness of breath. Negative for cough, hemoptysis, sputum production, wheezing and stridor.   Cardiovascular: Positive for leg swelling. Negative for chest pain, palpitations, orthopnea and PND.  Gastrointestinal: Negative for abdominal pain, constipation, diarrhea, heartburn, nausea and vomiting.  Genitourinary: Negative for dysuria, hematuria and urgency.  Musculoskeletal: Negative for joint pain and myalgias.  Skin: Negative for itching and rash.  Neurological: Negative for dizziness, tingling, weakness and headaches.  Endo/Heme/Allergies: Negative for environmental allergies. Does not bruise/bleed easily.  Psychiatric/Behavioral: Negative for depression. The patient is not nervous/anxious and does not have insomnia.   All other systems reviewed  and are negative.    Past Medical History  He,  has a past medical history of Anticoagulant long-term use, Arthritis, At risk for sleep apnea, Atrial flutter, chronic (HCC) (dx 01/ 2015), Bilateral lower extremity edema, BPH (benign prostatic hyperplasia), Cerebral microvascular disease, Chronic constipation, Chronic kidney disease (CKD), stage III (moderate), Diastolic CHF, chronic (HCC), Exertional dyspnea, Foley catheter in place, GERD (gastroesophageal reflux disease), Glaucoma, both eyes, History of adenomatous polyp of colon, History of CVA (cerebrovascular accident), HOH (hard of hearing), Hyperlipidemia, Hypertension, Iron deficiency anemia, Memory loss, Prostate cancer ([place]), RBBB (right bundle branch block with left anterior fascicular block), Rosacea, Type 2 diabetes mellitus ([place]), Urine retention, and Vitamin B 12 deficiency.   Surgical History    Past Surgical History:  Procedure Laterality Date  . CATARACT EXTRACTION W/ INTRAOCULAR LENS  IMPLANT, BILATERAL  2016  . IR THORACENTESIS ASP PLEURAL SPACE W/IMG GUIDE  12/01/2018  . ORIF PROXIMAL HUMEROUS FX AND BICEPS TENODESIS  11/09/2015  . OTHER SURGICAL HISTORY  1996   reconstruction left toe (amputation repair)  . PROSTATE BIOPSY N/A 07/22/2016   Procedure: BIOPSY TRANSRECTAL ULTRASONIC PROSTATE (TUBP);  Surgeon: Festus Aloe, MD;  Location: [facility];  Service: Urology;  Laterality: N/A;  . TONSILLECTOMY  1951  . TRANSTHORACIC ECHOCARDIOGRAM  08/23/2015   mild LVH, ef 55-60%, grade 1 diastolitc dysfunction  . [place]     Social History   reports that he quit smoking about 36 years ago. He has a 40.00 pack-year smoking history. He has never used smokeless tobacco. He reports that he does not drink alcohol or use drugs.   Family History   His family history includes Cancer in his sister. There is no history of Colon cancer, Rectal cancer, Stomach cancer, CAD, or Dementia.    Allergies Allergies  Allergen Reactions  . Ciprofloxacin Diarrhea  . Oxycodone Other (See Comments)    Altered mental status     Home Medications  Prior to Admission medications   Medication Sig Start Date End Date Taking? Authorizing Provider  acetaminophen (TYLENOL) 500 MG tablet Take 500 mg by mouth every 6 (six) hours as needed for mild pain, moderate pain, fever or headache.    Yes [provider]  Amino Acids-Protein Hydrolys (FEEDING SUPPLEMENT, PRO-STAT SUGAR FREE 64,) LIQD Take 30 mLs by mouth 3 (three) times daily with meals.   Yes [provider]  atorvastatin (LIPITOR) 20 MG tablet Take 1 tablet (20 mg total) by mouth daily. Patient taking differently: Take 20 mg by mouth every evening.  09/22/13  Yes Burnell Blanks, MD  donepezil (  ARICEPT) 10 MG tablet Take 1 tablet (10 mg total) by mouth at bedtime. 11/09/17  Yes Melvenia Beam, MD  dorzolamide (TRUSOPT) 2 % ophthalmic solution Place 1 drop into both eyes 2 (two) times daily.  06/20/15  Yes [provider]  finasteride (PROSCAR) 5 MG tablet Take 5 mg by mouth every evening.  05/16/16  Yes [provider]  furosemide (LASIX) 40 MG tablet Take two (2) tablets (80 mg total) by mouth each morning and take one (1) tablet (40 mg total) by mouth each afternoon. Please make appt. Patient taking differently: Take 40-80 mg by mouth See admin instructions. Take 80mg  by mouth in the morning and 40mg  in the evening. 01/07/18  Yes Burnell Blanks, MD  HYDROcodone-acetaminophen (NORCO/VICODIN) 5-325 MG tablet Take 1 tablet by mouth every 4 (four) hours as needed for moderate pain.   Yes [provider]  insulin aspart (NOVOLOG) 100 UNIT/ML injection Inject 2-9 Units into the skin See admin instructions. Per Sliding Scale three times daily and at bedtime. 101-150 2 units 151-200 3 units 201-250 5 units 251-300 7 units 301-350 9 units > 350 11 units   Yes [provider]   insulin glargine (LANTUS) 100 UNIT/ML injection Inject 10 Units into the skin 2 (two) times daily.    Yes [provider]  Melatonin 3 MG TABS Take 3 mg by mouth at bedtime.   Yes [provider]  metoprolol succinate (TOPROL-XL) 50 MG 24 hr tablet Take 1 tablet (50 mg total) by mouth 2 (two) times daily. Take with or immediately following a meal. Patient taking differently: Take 25 mg by mouth daily. Take with or immediately following a meal. 04/04/15  Yes Burnell Blanks, MD  Multiple Vitamin (MULTIVITAMIN WITH MINERALS) TABS tablet Take 1 tablet by mouth daily.   Yes [provider]  neomycin-bacitracin-polymyxin (NEOSPORIN) [id number] ointment Apply 1 application topically daily. Left lateral arm skin tear   Yes [provider]  omeprazole (PRILOSEC) 20 MG capsule Take 20 mg by mouth every morning.  04/26/11  Yes [provider]  potassium chloride SA (KLOR-CON) 20 MEQ tablet Take 20 mEq by mouth daily.   Yes [provider]  rivaroxaban (XARELTO) 20 MG TABS tablet Take 1 tablet (20 mg total) by mouth daily with supper. Patient taking differently: Take 20 mg by mouth daily.  07/23/16  Yes Festus Aloe, MD  sertraline (ZOLOFT) 25 MG tablet Take 25 mg by mouth daily.   Yes [provider]  tamsulosin (FLOMAX) 0.4 MG CAPS capsule Take 0.4 mg by mouth daily with breakfast.  01/09/14  Yes [provider]  Vitamin D, Ergocalciferol, (DRISDOL) 1.25 MG (50000 UT) CAPS capsule Take 50,000 Units by mouth every Wednesday.   Yes [provider]  Hyoscyamine Sulfate SL (LEVSIN/SL) 0.125 MG SUBL Dissolve 1 tablet on the tongue at the first sign of diarrhea episodes, you may use 1 tablet every 6 hours as needed. Patient not taking: Reported on 09/10/2019 06/03/18   Alfredia Ferguson, PA-C     Garner Nash, DO [facility] 09/12/2019 12:33 PM

## 2019-09-12 NOTE — Progress Notes (Signed)
Patient ID: Darrell Mcclain, male   DOB: 08/11/1941, 79 y.o.   MRN: [id number]  PROGRESS NOTE    Darrell Mcclain  OYD:[id number] DOB: Jul 12, 1941 DOA: 09/09/2019 PCP: Burnard Bunting, MD   Brief Narrative:  79 year old male with history of diabetes mellitus type II, prostate cancer, dementia, hypertension, hyperlipidemia, unspecified CVA, BPH with history of chronic indwelling Foley (not currently present), chronic diastolic CHF, chronic kidney disease stage III, chronic respiratory failure on home oxygen, atrial flutter on Xarelto presented with foot infection.  MRI of the left foot showed osteomyelitis.  He was started on broad-spectrum antibiotics.  Orthopedics was consulted.  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  Orthopedics following and will decide timing for surgical intervention. -ABI was normal -Continue broad-spectrum antibiotics.  Pain management.  Chronic hypoxic respiratory failure on home oxygen -Follows up with pulmonary as an outpatient.  08/15/2019: CT chest had shown increased size of left pleural effusion, now moderate; persistent stable right pleural effusion and suspect now atelectasis in the right lung base also with stable mediastinal LAD -Chest x-ray on admission showed slight increase in right loculated moderate pleural effusion.  Continue Lasix.  Consulted pulmonary.  Will follow recommendations.  Chronic diastolic CHF Paroxysmal atrial flutter -Echo on 07/01/2019 showed EF of 50 to 55% with moderate TR and severely elevated pulmonary artery pressure -Currently rate controlled with Toprol-XL.   Xarelto held because of need for surgical intervention. -Strict input and output.  Daily weights.  Continue Lasix.  Diabetes mellitus type 2 -A1c 6.8.  Continue Lantus with CBGs with SSI.  Glucophage on hold  Chronic kidney disease stage IIIa -Stable.  Monitor  Hypertension--continue  Norvasc, lisinopril and Toprol-XL  Hyperlipidemia -Continue Lipitor  Obesity -Outpatient follow-up  History of prostate cancer -Treated in 2018  Vascular dementia - palliative care consultation for goals of care discussion is pending -Continue Aricept -Fall precautions.  Monitor mental status    DVT prophylaxis: Xarelto discontinued.  Will use heparin for prophylaxis Code Status: DNR Family Communication: Spoke to patient at bedside Disposition Plan: Depends on clinical outcome  Consultants: Orthopedics  Procedures: None  Antimicrobials: Cefepime Flagyl and vancomycin from 09/10/2019 onwards  Subjective: Patient seen and examined at bedside.  Awake, poor historian.  No overnight worsening leg pain.  No fever or vomiting reported. Objective: Vitals:   09/11/19 0841 09/11/19 1349 09/11/19 2014 09/12/19 0300  BP: 119/71 129/68 (!) 102/51 (!) 144/75  Pulse: 61 61 60 66  Resp: 15 15 16 16   Temp: 97.6 F (36.4 C) (!) 97.4 F (36.3 C) 97.8 F (36.6 C) 98.4 F (36.9 C)  TempSrc: Oral Oral Oral Oral  SpO2: 94% 96% 95% 98%  Weight:      Height:        Intake/Output Summary (Last 24 hours) at 09/12/2019 0807 Last data filed at 09/12/2019 0300 Gross per 24 hour  Intake 527.26 ml  Output 1500 ml  Net -972.74 ml   Filed Weights   09/10/19 0944  Weight: 95 kg    Examination:  General exam: No distress.  Poor historian.  Still slow to respond to questions. Respiratory system: Bilateral decreased breath sounds at bases with basilar crackles Cardiovascular system: Rate controlled, S1-S2 heard Gastrointestinal system: Abdomen is nondistended, soft and nontender. Normal bowel sounds heard. Extremities: No cyanosis; bilateral lower extremity edema present; left foot dressing present   Data Reviewed: I have personally reviewed following labs and imaging studies  CBC: Recent Labs  Lab 09/09/19 1523 09/11/19 0339 09/12/19 0246  WBC 9.7 9.1 8.1  NEUTROABS 8.1*  --  6.4    HGB 11.7* 10.4* 11.0*  HCT 42.1 37.0* 39.3  MCV 89.0 87.7 88.3  PLT 255 220 295   Basic Metabolic Panel: Recent Labs  Lab 09/09/19 1523 09/11/19 0339 09/12/19 0246  NA 144 143 143  K 4.2 3.7 3.6  CL 100 103 103  CO2 34* 33* 32  GLUCOSE 180* 181* 150*  BUN 22 25* 32*  CREATININE 1.26* 1.18 1.31*  CALCIUM 8.5* 8.3* 8.3*  MG  --   --  1.9   GFR: Estimated Creatinine Clearance: 52.7 mL/min (A) (by C-G formula based on SCr of 1.31 mg/dL (H)). Liver Function Tests: Recent Labs  Lab 09/09/19 1523  AST 16  ALT 16  ALKPHOS 102  BILITOT 1.1  PROT 6.0*  ALBUMIN 2.7*   No results for input(s): LIPASE, AMYLASE in the last 168 hours. No results for input(s): AMMONIA in the last 168 hours. Coagulation Profile: No results for input(s): INR, PROTIME in the last 168 hours. Cardiac Enzymes: No results for input(s): CKTOTAL, CKMB, CKMBINDEX, TROPONINI in the last 168 hours. BNP (last 3 results) No results for input(s): PROBNP in the last 8760 hours. HbA1C: Recent Labs    09/09/19 1617  HGBA1C 6.8*   CBG: Recent Labs  Lab 09/11/19 0631 09/11/19 1125 09/11/19 1617 09/11/19 2051 09/12/19 0633  GLUCAP 115* 114* 92 144* 92   Lipid Profile: No results for input(s): CHOL, HDL, LDLCALC, TRIG, CHOLHDL, LDLDIRECT in the last 72 hours. Thyroid Function Tests: No results for input(s): TSH, T4TOTAL, FREET4, T3FREE, THYROIDAB in the last 72 hours. Anemia Panel: No results for input(s): VITAMINB12, FOLATE, FERRITIN, TIBC, IRON, RETICCTPCT in the last 72 hours. Sepsis Labs: No results for input(s): PROCALCITON, LATICACIDVEN in the last 168 hours.  Recent Results (from the past 240 hour(s))  Blood culture (routine x 2)     Status: None (Preliminary result)   Collection Time: 09/09/19  3:48 PM   Specimen: BLOOD  Result Value Ref Range Status   Specimen Description BLOOD RIGHT ANTECUBITAL  Final   Special Requests   Final    BOTTLES DRAWN AEROBIC AND ANAEROBIC Blood Culture  adequate volume   Culture   Final    NO GROWTH 2 DAYS Performed at [facility], [address], [place], [place] [address]    Report Status PENDING  Incomplete  Blood culture (routine x 2)     Status: None (Preliminary result)   Collection Time: 09/09/19  4:37 PM   Specimen: BLOOD  Result Value Ref Range Status   Specimen Description BLOOD LEFT ANTECUBITAL  Final   Special Requests   Final    BOTTLES DRAWN AEROBIC AND ANAEROBIC Blood Culture results may not be optimal due to an inadequate volume of blood received in culture bottles   Culture   Final    NO GROWTH 2 DAYS Performed at [facility], [place] [address] [place],  [address]    Report Status PENDING  Incomplete  SARS CORONAVIRUS 2 (TAT 6-24 HRS) Nasopharyngeal Nasopharyngeal Swab     Status: None   Collection Time: 09/10/19  3:55 AM   Specimen: Nasopharyngeal Swab  Result Value Ref Range Status   SARS Coronavirus 2 NEGATIVE NEGATIVE Final    Comment: (NOTE) SARS-CoV-2 target nucleic acids are NOT DETECTED. The SARS-CoV-2 RNA is generally detectable in upper and lower respiratory specimens during  the acute phase of infection. Negative results do not preclude SARS-CoV-2 infection, do not rule out co-infections with other pathogens, and should not be used as the sole basis for treatment or other patient management decisions. Negative results must be combined with clinical observations, patient history, and epidemiological information. The expected result is Negative. Fact Sheet for Patients: SugarRoll.be Fact Sheet for Healthcare Providers: https://www.woods-mathews.com/ This test is not yet approved or cleared by the [place] FDA and  has been authorized for detection and/or diagnosis of SARS-CoV-2 by FDA under an Emergency Use Authorization (EUA). This EUA will remain  in effect (meaning this test can be used) for the duration of the COVID-19 declaration  under Section 56 4(b)(1) of the Act, 21 U.S.C. section 360bbb-3(b)(1), unless the authorization is terminated or revoked sooner. Performed at [facility], [place] [address] [place], [place] [address]   MRSA PCR Screening     Status: None   Collection Time: 09/10/19  1:05 PM   Specimen: Nasal Mucosa; Nasopharyngeal  Result Value Ref Range Status   MRSA by PCR NEGATIVE NEGATIVE Final    Comment:        The GeneXpert MRSA Assay (FDA approved for NASAL specimens only), is one component of a comprehensive MRSA colonization surveillance program. It is not intended to diagnose MRSA infection nor to guide or monitor treatment for MRSA infections. Performed at [facility], [place] [address], [place], [place] [address]          Radiology Studies: MR HEEL RIGHT W WO CONTRAST  Result Date: 09/10/2019 CLINICAL DATA:  Right heel ulcer, diabetic, peripheral vascular disease EXAM: MR OF THE RIGHT HEEL WITHOUT AND WITH CONTRAST TECHNIQUE: Multiplanar, multisequence MR imaging of the right heel was performed both before and after administration of intravenous contrast. CONTRAST:  61mL GADAVIST GADOBUTROL 1 MMOL/ML IV SOLN COMPARISON:  None available. FINDINGS: Bones/Joint/Cartilage No acute fracture or malalignment. Small os trigonum with associated marrow edema. Remaining visualized marrow structures are within normal limits without abnormal bone marrow signal. No cortical destruction. Tibiotalar joint is intact without chondral defect or joint effusion. Subtalar joints are intact without chondral defect or joint effusion. Ligaments The medial and lateral ankle ligaments are intact. Lisfranc ligament intact. Muscles and Tendons Diffuse muscular atrophy and fatty infiltration. Diffuse edema like signal throughout the visualized musculature suggesting denervation changes or myositis. Achilles tendon intact. Peroneal tendons intact. Posteromedial and anterior ankle tendons are intact. Soft  tissues There are foci of metallic susceptibility artifact adjacent to the plantar cortex of the posterior calcaneus (series 5, image 10). Circumferential soft tissue edema and enhancement suggesting cellulitis. No deep soft tissue ulceration. No well-defined or drainable soft tissue fluid collection. IMPRESSION: 1. Circumferential soft tissue edema and enhancement suggesting cellulitis. No well-defined or drainable soft tissue fluid collection. 2. No evidence of osteomyelitis. 3. Os trigonum with associated mild marrow edema, which can be seen in the setting of posterior ankle impingement. 4. Diffuse edema-like signal throughout the visualized musculature suggesting denervation changes or myositis. 5. Small foci of susceptibility artifact adjacent to the plantar cortex of the posterior calcaneus, suggesting small metallic foreign bodies. Radiographic correlation would be helpful for further evaluation if clinically indicated. Electronically Signed   By: Davina Poke D.O.   On: 09/10/2019 17:25   DG CHEST PORT 1 VIEW  Result Date: 09/10/2019 CLINICAL DATA:  Right pleural effusion. EXAM: PORTABLE CHEST 1 VIEW COMPARISON:  Chest radiograph 08/20/2019 FINDINGS: Stable cardiomediastinal contours with enlarged heart size. Mild  diffuse bilateral interstitial opacities. Stable to slight increase in size of a loculated moderate right pleural effusion. Persistent right basilar opacity. Mild opacities at the left lung base may reflect atelectasis and or infiltrate with likely small volume pleural fluid. No evidence of pneumothorax IMPRESSION: 1. Stable to slight increase in size of a loculated moderate right pleural effusion. 2. Diffuse mild interstitial opacities suspect mild edema. 3. Persistent bibasilar pulmonary opacities. Electronically Signed   By: Audie Pinto M.D.   On: 09/10/2019 11:43   VAS [place] ABI WITH/WO TBI  Result Date: 09/11/2019 LOWER EXTREMITY DOPPLER STUDY Indications: Ulceration, and  gangrene.  Comparison Study: No prior study. Performing Technologist: Sharion Dove RVS  Examination Guidelines: A complete evaluation includes at minimum, Doppler waveform signals and systolic blood pressure reading at the level of bilateral brachial, anterior tibial, and posterior tibial arteries, when vessel segments are accessible. Bilateral testing is considered an integral part of a complete examination. Photoelectric Plethysmograph (PPG) waveforms and toe systolic pressure readings are included as required and additional duplex testing as needed. Limited examinations for reoccurring indications may be performed as noted.  ABI Findings: +---------+------------------+-----+---------+--------+ Right    Rt Pressure (mmHg)IndexWaveform Comment  +---------+------------------+-----+---------+--------+ Brachial 127                    triphasic         +---------+------------------+-----+---------+--------+ PTA      136               1.07 biphasic          +---------+------------------+-----+---------+--------+ DP       156               1.23 biphasic          +---------+------------------+-----+---------+--------+ Great Toe79                0.62                   +---------+------------------+-----+---------+--------+ +---------+------------------+-----+---------+-------+ Left     Lt Pressure (mmHg)IndexWaveform Comment +---------+------------------+-----+---------+-------+ Brachial 124                    triphasic        +---------+------------------+-----+---------+-------+ PTA      112               0.88 biphasic         +---------+------------------+-----+---------+-------+ DP       159               1.25 biphasic         +---------+------------------+-----+---------+-------+ Great Toe70                0.55                  +---------+------------------+-----+---------+-------+ +-------+-----------+-----------+------------+------------+  ABI/TBIToday's ABIToday's TBIPrevious ABIPrevious TBI +-------+-----------+-----------+------------+------------+ Right  1.23       0.62                                +-------+-----------+-----------+------------+------------+ Left   1.25       0.55                                +-------+-----------+-----------+------------+------------+  Summary: Right: Resting right ankle-brachial index is within normal range. No evidence of significant right lower extremity arterial disease. The right toe-brachial index is  abnormal. Left: Resting left ankle-brachial index is within normal range. No evidence of significant left lower extremity arterial disease. The left toe-brachial index is abnormal.  *See table(s) above for measurements and observations.  Electronically signed by Deitra Mayo MD on 09/11/2019 at 5:04:43 PM.   Final         Scheduled Meds: . amLODipine  5 mg Oral Daily  . atorvastatin  20 mg Oral QPM  . docusate sodium  100 mg Oral BID  . donepezil  10 mg Oral QHS  . dorzolamide  1 drop Both Eyes BID  . feeding supplement (GLUCERNA SHAKE)  237 mL Oral TID BM  . finasteride  5 mg Oral QPM  . furosemide  40 mg Oral q1800  . furosemide  80 mg Oral Daily  . insulin aspart  0-15 Units Subcutaneous TID WC  . insulin aspart  0-5 Units Subcutaneous QHS  . insulin aspart  6 Units Subcutaneous TID AC  . insulin glargine  40 Units Subcutaneous QHS  . lisinopril  10 mg Oral Daily  . metoprolol succinate  25 mg Oral Q breakfast  . multivitamin  1 tablet Oral Daily  . nutrition supplement (JUVEN)  1 packet Oral BID BM  . pantoprazole  40 mg Oral Daily  . tamsulosin  0.4 mg Oral Q breakfast   Continuous Infusions: . cefTRIAXone (ROCEPHIN)  IV 2 g (09/11/19 0850)  . metronidazole 500 mg (09/12/19 0253)  . vancomycin 1,000 mg (09/11/19 1250)          Aline August, MD [facility] 09/12/2019, 8:07 AM

## 2019-09-12 NOTE — Procedures (Signed)
Thora w/ [place] Note Timeout performed Right chest examined with [place] and skin overlying fluid pocket marked Area prepped and anesthesized with 1% lidocaine 100  cc cloudy  fluid removed Bandaid applied to site CXR pending No immediate complications

## 2019-09-12 NOTE — Progress Notes (Signed)
Patient preferred to do wound care later this morning because he wanted to sleep more, day shift nurse updated, will continue to monitor.

## 2019-09-12 NOTE — Plan of Care (Signed)

## 2019-09-13 DIAGNOSIS — Z7189 Other specified counseling: Secondary | ICD-10-CM

## 2019-09-13 DIAGNOSIS — I10 Essential (primary) hypertension: Secondary | ICD-10-CM

## 2019-09-13 DIAGNOSIS — Z515 Encounter for palliative care: Secondary | ICD-10-CM

## 2019-09-13 DIAGNOSIS — R413 Other amnesia: Secondary | ICD-10-CM

## 2019-09-13 DIAGNOSIS — J9611 Chronic respiratory failure with hypoxia: Secondary | ICD-10-CM

## 2019-09-13 LAB — CBC WITH DIFFERENTIAL/PLATELET
Abs Immature Granulocytes: 0.05 10*3/uL (ref 0.00–0.07)
Basophils Absolute: 0.1 10*3/uL (ref 0.0–0.1)
Basophils Relative: 1 %
Eosinophils Absolute: 0.1 10*3/uL (ref 0.0–0.5)
Eosinophils Relative: 1 %
HCT: 36.7 % — ABNORMAL LOW (ref 39.0–52.0)
Hemoglobin: 10.7 g/dL — ABNORMAL LOW (ref 13.0–17.0)
Immature Granulocytes: 1 %
Lymphocytes Relative: 15 %
Lymphs Abs: 1.6 10*3/uL (ref 0.7–4.0)
MCH: 25.1 pg — ABNORMAL LOW (ref 26.0–34.0)
MCHC: 29.2 g/dL — ABNORMAL LOW (ref 30.0–36.0)
MCV: 86.2 fL (ref 80.0–100.0)
Monocytes Absolute: 1 10*3/uL (ref 0.1–1.0)
Monocytes Relative: 10 %
Neutro Abs: 7.8 10*3/uL — ABNORMAL HIGH (ref 1.7–7.7)
Neutrophils Relative %: 72 %
Platelets: 218 10*3/uL (ref 150–400)
RBC: 4.26 MIL/uL (ref 4.22–5.81)
RDW: 17.9 % — ABNORMAL HIGH (ref 11.5–15.5)
WBC: 10.7 10*3/uL — ABNORMAL HIGH (ref 4.0–10.5)
nRBC: 0 % (ref 0.0–0.2)

## 2019-09-13 LAB — BASIC METABOLIC PANEL
Anion gap: 10 (ref 5–15)
BUN: 36 mg/dL — ABNORMAL HIGH (ref 8–23)
CO2: 31 mmol/L (ref 22–32)
Calcium: 8.2 mg/dL — ABNORMAL LOW (ref 8.9–10.3)
Chloride: 101 mmol/L (ref 98–111)
Creatinine, Ser: 1.41 mg/dL — ABNORMAL HIGH (ref 0.61–1.24)
GFR calc Af Amer: 56 mL/min — ABNORMAL LOW (ref >60)
GFR calc non Af Amer: 48 mL/min — ABNORMAL LOW (ref >60)
Glucose, Bld: 26 mg/dL — CL (ref 70–99)
Potassium: 3.4 mmol/L — ABNORMAL LOW (ref 3.5–5.1)
Sodium: 142 mmol/L (ref 135–145)

## 2019-09-13 LAB — MAGNESIUM: Magnesium: 1.9 mg/dL (ref 1.7–2.4)

## 2019-09-13 LAB — GLUCOSE, CAPILLARY
Glucose-Capillary: 144 mg/dL — ABNORMAL HIGH (ref 70–99)
Glucose-Capillary: 25 mg/dL — CL (ref 70–99)
Glucose-Capillary: 51 mg/dL — ABNORMAL LOW (ref 70–99)
Glucose-Capillary: 92 mg/dL (ref 70–99)
Glucose-Capillary: 233 mg/dL — ABNORMAL HIGH (ref 70–99)
Glucose-Capillary: 219 mg/dL — ABNORMAL HIGH (ref 70–99)

## 2019-09-13 LAB — TRIGLYCERIDES, BODY FLUIDS: Triglycerides, Fluid: <9 mg/dL

## 2019-09-13 LAB — CYTOLOGY - NON PAP

## 2019-09-13 LAB — FLOW CYTOMETRY REQUEST - FLUID (INPATIENT)

## 2019-09-13 MED ORDER — DEXTROSE 50 % IV SOLN
INTRAVENOUS | Status: AC
  Filled 2019-09-13: qty 50

## 2019-09-13 MED ORDER — INSULIN ASPART 100 UNIT/ML ~~LOC~~ SOLN
0.0000 [IU] | Freq: Three times a day (TID) | SUBCUTANEOUS | Status: DC
  Administered 2019-09-14: 2 [IU] via SUBCUTANEOUS
  Administered 2019-09-15: 12:00:00 7 [IU] via SUBCUTANEOUS
  Administered 2019-09-15: 5 [IU] via SUBCUTANEOUS
  Administered 2019-09-15 – 2019-09-16 (×3): 3 [IU] via SUBCUTANEOUS
  Administered 2019-09-16 – 2019-09-17 (×2): 5 [IU] via SUBCUTANEOUS
  Administered 2019-09-17: 3 [IU] via SUBCUTANEOUS
  Administered 2019-09-17 – 2019-09-18 (×2): 5 [IU] via SUBCUTANEOUS

## 2019-09-13 MED ORDER — INSULIN ASPART 100 UNIT/ML ~~LOC~~ SOLN
0.0000 [IU] | Freq: Every day | SUBCUTANEOUS | Status: DC
  Administered 2019-09-13: 22:00:00 2 [IU] via SUBCUTANEOUS
  Administered 2019-09-14: 3 [IU] via SUBCUTANEOUS
  Administered 2019-09-15: 2 [IU] via SUBCUTANEOUS
  Administered 2019-09-16: 4 [IU] via SUBCUTANEOUS
  Administered 2019-09-17: 3 [IU] via SUBCUTANEOUS

## 2019-09-13 MED ORDER — CHLORHEXIDINE GLUCONATE 4 % EX LIQD
60.0000 mL | Freq: Once | CUTANEOUS | Status: AC
  Administered 2019-09-14: 4 via TOPICAL

## 2019-09-13 MED ORDER — POTASSIUM CHLORIDE CRYS ER 20 MEQ PO TBCR
40.0000 meq | EXTENDED_RELEASE_TABLET | Freq: Once | ORAL | Status: AC
  Administered 2019-09-13: 40 meq via ORAL
  Filled 2019-09-13: qty 2

## 2019-09-13 NOTE — Progress Notes (Signed)
Daughter Colletta [place]: [phone]) called to check on patient.  Update given with patient consent.

## 2019-09-13 NOTE — Progress Notes (Signed)
Notified Dr. Starla Link of critical lab value drawn at 0630 per lab- Glucose of 26.  Clarified orders for IVF and insulin due to decreased oral intake and glucose levels this morning.  MD states to hold Sliding Scale insulin for 0800 dose today.

## 2019-09-13 NOTE — Plan of Care (Signed)
?  Problem: Clinical Measurements: ?Goal: Ability to maintain clinical measurements within normal limits will improve ?Outcome: Progressing ?Goal: Will remain free from infection ?Outcome: Progressing ?Goal: Diagnostic test results will improve ?Outcome: Progressing ?  ?

## 2019-09-13 NOTE — Progress Notes (Addendum)
17:14 - Notified Dr Starla Link that patient had CBG of 233 before dinner.  Alekh ordering sliding scale  18:22 - Per sliding scale order, start tomorrow at 0800

## 2019-09-13 NOTE — Consult Note (Signed)
NAME:  Darrell Mcclain, MRN:  [id number], DOB:  1941-02-06, LOS: 3 ADMISSION DATE:  09/09/2019, CONSULTATION DATE:  09/12/2019 REFERRING MD: Dr. Starla Link, CHIEF COMPLAINT:  Pleural effusion    Brief History   79 year old male past medical history type 2 diabetes, prostate cancer, hypertension, hyperlipidemia, CVA, history of chronic indwelling Foley, chronic diastolic heart failure, chronic kidney disease chronic respiratory failure on home oxygen therapy, atrial flutter on Xarelto presented with left-sided foot osteomyelitis started on broad-spectrum antibiotics.  Pulmonary was consulted for recommendations and management of chronic right-sided pleural effusion.  History of present illness   79 year old gentleman past medical history of type 2 diabetes, prostate cancer, hypertension, hyperlipidemia, CVA, history of chronic indwelling Foley, chronic diastolic heart failure, chronic kidney disease chronic respiratory failure on home oxygen therapy, atrial flutter on Xarelto admitted to the hospital for left-sided foot osteomyelitis.  Started on broad-spectrum antibiotics.  Patient has multiple hospital admissions.  He has also had a chronic right-sided pleural effusion greater than the left for several months now.  Past Medical History   Past Medical History:  Diagnosis Date  . Anticoagulant long-term use    xarelto  . Arthritis    right hand  . At risk for sleep apnea    STOP-BANG==  6      PT ALREADY SET UP TO HAVE STUDY AT [facility] , PCP AWARE  . Atrial flutter, chronic [facility]) dx 01/ 2015   cardiologist-  dr Angelena Form  . Bilateral lower extremity edema   . BPH (benign prostatic hyperplasia)   . Cerebral microvascular disease    advanced and multiple foci throughout hemispheres and brain stem-- residual memory issues  . Chronic constipation   . Chronic kidney disease (CKD), stage III (moderate)    nephrologist-  dr Marval Regal [facility])  . Diastolic CHF, chronic ([place])     . Exertional dyspnea   . Foley catheter in place   . GERD (gastroesophageal reflux disease)   . Glaucoma, both eyes   . History of adenomatous polyp of colon    2012  . History of CVA (cerebrovascular accident)    (pt had no symptoms other than memory issues) per MRI 11-29-2015  very small infarct right frontal lobe and multiple foci throughout hemispheres and brain stem  . 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 Hyperlipidemia   . Hypertension   . Iron deficiency anemia   . Memory loss   . Prostate cancer ([place])   . RBBB (right bundle branch block with left anterior fascicular block)   . Rosacea   . Type 2 diabetes mellitus ([place])   . Urine retention   . Vitamin B 12 deficiency      Significant Hospital Events     Consults:  Ortho  Procedures:  None   Significant Diagnostic Tests:  Chest x-ray 09/10/2019: Moderate size loculated right-sided pleural effusion The patient's images have been independently reviewed by me.    Micro Data:  Covid negative 09/09/2019: Blood cultures no growth to date MRSA PCR negative  Antimicrobials:  Vancomycin Ceftriaxone Metronidazole  Interim history/subjective:  See HPI above  Objective   Blood pressure (!) 95/52, pulse 63, temperature 98.2 F (36.8 C), temperature source Oral, resp. rate 17, height 5\' 7"  (1.702 m), weight 99.3 kg, SpO2 98 %.        Intake/Output Summary (Last 24 hours) at 09/13/2019 1717 Last data filed at 09/13/2019 1500 Gross per 24 hour  Intake 1290 ml  Output 1403 ml  Net -113 ml   Filed Weights   09/10/19 0944 09/13/19 0500  Weight: 95 kg 99.3 kg    Examination: General appearance: 79 y.o., male, NAD, conversant HENT: tracking  Neck: Trachea midline;  Lungs: CTAB, no crackles, no wheeze CV: RRR, S1, S2, no MRGs  Abdomen: Soft, non-tender; non-distended, BS present, obese pannus  Extremities: No peripheral edema Psych: Appropriate affect Neuro: Alert and oriented to person and place, no  focal deficit     Resolved Hospital Problem list     Assessment & Plan:   Loculated right-sided pleural effusion. This appears to be present dating back at least since February 2020. Patient did have thoracentesis by interventional radiology in March 2020 Pleural fluid analysis at this time was exudative, lymphocyte predominant, 1100 total nucleated cells cytology had lymphocytosis but no malignancy identified. Chronic effusion  Plan: Lymphocyte predominate again on cell count  Likely related to chronic medical conditions  Cytology again negative for malignancy, reviewed  I suspect it will reaccumulate like it did in the past  Could be component of pleural scarring but looks like it tried to re-expand on CT  Nothing further needed as this time Attempt to maintain euvolemia/ volume status  He may need serial drainage in future But with dementia and other medical comorbid conditions I would be conservative   Dementia/MCI  Chronic hypoxemic respiratory failure Likely related to underlying chronic diastolic heart failure History of tobacco abuse, quit greater than 35 years ago. - per primary   Hypertension Diabetes Per primary  Pulmonary will sign off. Please call with any questions. Thanks for the consult.   Labs   CBC: Recent Labs  Lab 09/09/19 1523 09/11/19 0339 09/12/19 0246 09/13/19 0627  WBC 9.7 9.1 8.1 10.7*  NEUTROABS 8.1*  --  6.4 7.8*  HGB 11.7* 10.4* 11.0* 10.7*  HCT 42.1 37.0* 39.3 36.7*  MCV 89.0 87.7 88.3 86.2  PLT 255 220 214 009    Basic Metabolic Panel: Recent Labs  Lab 09/09/19 1523 09/11/19 0339 09/12/19 0246 09/13/19 0627  NA 144 143 143 142  K 4.2 3.7 3.6 3.4*  CL 100 103 103 101  CO2 34* 33* 32 31  GLUCOSE 180* 181* 150* 26*  BUN 22 25* 32* 36*  CREATININE 1.26* 1.18 1.31* 1.41*  CALCIUM 8.5* 8.3* 8.3* 8.2*  MG  --   --  1.9 1.9   GFR: Estimated Creatinine Clearance: 50.1 mL/min (A) (by C-G formula based on SCr of 1.41 mg/dL  (H)). Recent Labs  Lab 09/09/19 1523 09/11/19 0339 09/12/19 0246 09/13/19 0627  WBC 9.7 9.1 8.1 10.7*    Liver Function Tests: Recent Labs  Lab 09/09/19 1523  AST 16  ALT 16  ALKPHOS 102  BILITOT 1.1  PROT 6.0*  ALBUMIN 2.7*   No results for input(s): LIPASE, AMYLASE in the last 168 hours. No results for input(s): AMMONIA in the last 168 hours.  ABG    Component Value Date/Time   TCO2 27 07/22/2016 0830     Coagulation Profile: No results for input(s): INR, PROTIME in the last 168 hours.  Cardiac Enzymes: No results for input(s): CKTOTAL, CKMB, CKMBINDEX, TROPONINI in the last 168 hours.  HbA1C: Hgb A1c MFr Bld  Date/Time Value Ref Range Status  09/09/2019 04:17 PM 6.8 (H) 4.8 - 5.6 % Final    Comment:    (NOTE) Pre diabetes:          5.7%-6.4% Diabetes:              >  6.4% Glycemic control for   <7.0% adults with diabetes     CBG: Recent Labs  Lab 09/13/19 0646 09/13/19 0702 09/13/19 0719 09/13/19 1211 09/13/19 1648  GLUCAP 25* 51* 144* 92 233*      Garner Nash, DO [facility] 09/13/2019 5:17 PM

## 2019-09-13 NOTE — Progress Notes (Signed)
Patient ID: Darrell Mcclain, male   DOB: 04-26-1941, 79 y.o.   MRN: [id number]  PROGRESS NOTE    Darrell Mcclain  EXH:[id number] DOB: Oct 31, 1940 DOA: 09/09/2019 PCP: Burnard Bunting, MD   Brief Narrative:  79 year old male with history of diabetes mellitus type II, prostate cancer, dementia, hypertension, hyperlipidemia, unspecified CVA, BPH with history of chronic indwelling Foley (not currently present), chronic diastolic CHF, chronic kidney disease stage III, chronic respiratory failure on home oxygen, atrial flutter on Xarelto presented with foot infection.  MRI of the left foot showed osteomyelitis.  He was started on broad-spectrum antibiotics.  Orthopedics was consulted.  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  Orthopedics following and will decide timing for surgical intervention. -ABI was normal -Continue broad-spectrum antibiotics.  Pain management.  Chronic hypoxic respiratory failure on home oxygen at 2 L/min -Follows up with pulmonary as an outpatient.  08/15/2019: CT chest had shown increased size of left pleural effusion, now moderate; persistent stable right pleural effusion and suspect now atelectasis in the right lung base also with stable mediastinal LAD -Chest x-ray on admission showed slight increase in right loculated moderate pleural effusion.  Continue Lasix.  Consulted pulmonary.  Status post right sided bedside thoracentesis done by pulmonary on 09/12/2019.  Repeat CT chest after thoracentesis showed large left pleural effusion.  Will follow further pulmonary recommendations -Currently on 2 L oxygen via nasal cannula  Chronic diastolic CHF Paroxysmal atrial flutter -Echo on 07/01/2019 showed EF of 50 to 55% with moderate TR and severely elevated pulmonary artery pressure -Currently rate controlled with Toprol-XL.   Xarelto held because of need for surgical intervention. -Strict input and  output.  Daily weights.  Continue Lasix.  Negative balance of 640 cc since admission.  Diabetes mellitus type 2 with hypoglycemia -A1c 6.8.  Will DC Lantus today because of hypoglycemia early this morning.  Continue CBGs.  Glucophage on hold  Chronic kidney disease stage IIIa -Creatinine stable.  Monitor.    Hypokalemia -Replace.  Repeat a.m. labs  Anemia of chronic disease -From chronic kidney disease.  Hemoglobin stable.  Hypertension--continue Norvasc, lisinopril and Toprol-XL  Hyperlipidemia -Continue Lipitor  Obesity -Outpatient follow-up  History of prostate cancer -Treated in 2018  Vascular dementia - palliative care consultation for goals of care discussion is pending -Continue Aricept -Fall precautions.  Monitor mental status    DVT prophylaxis: Xarelto discontinued.  Heparin started. Code Status: DNR Family Communication: Spoke to patient at bedside Disposition Plan: Depends on clinical outcome  Consultants: Orthopedics  Procedures: None  Antimicrobials: Cefepime Flagyl and vancomycin from 09/10/2019 onwards  Subjective: Patient seen and examined at bedside.  Poor historian.  No worsening shortness of breath or leg pain reported.  No fever or vomiting.   Objective: Vitals:   09/12/19 1507 09/12/19 2030 09/13/19 0345 09/13/19 0500  BP: 113/62 (!) 115/53 113/63   Pulse: 72 61 66   Resp: 15 18 17    Temp: 98.1 F (36.7 C) 98.1 F (36.7 C) 99 F (37.2 C)   TempSrc: Oral Oral Oral   SpO2: 90% 94% 97%   Weight:    99.3 kg  Height:        Intake/Output Summary (Last 24 hours) at 09/13/2019 0806 Last data filed at 09/13/2019 0300 Gross per 24 hour  Intake 960 ml  Output 1350 ml  Net -390 ml   Filed Weights   09/10/19 0944 09/13/19 0500  Weight: 95 kg  99.3 kg    Examination:  General exam: No acute distress.  Poor historian.  Sleepy, wakes up slightly, slow to respond to questions.   Respiratory system: Bilateral decreased breath sounds at bases  with scattered crackles mostly in the bases.  No wheezing  cardiovascular system: S1-S2 heard, rate controlled Gastrointestinal system: Abdomen is nondistended, soft and nontender. Normal bowel sounds heard. Extremities: No cyanosis; bilateral lower extremity edema present; left foot dressing present   Data Reviewed: I have personally reviewed following labs and imaging studies  CBC: Recent Labs  Lab 09/09/19 1523 09/11/19 0339 09/12/19 0246 09/13/19 0627  WBC 9.7 9.1 8.1 10.7*  NEUTROABS 8.1*  --  6.4 7.8*  HGB 11.7* 10.4* 11.0* 10.7*  HCT 42.1 37.0* 39.3 36.7*  MCV 89.0 87.7 88.3 86.2  PLT 255 220 214 469   Basic Metabolic Panel: Recent Labs  Lab 09/09/19 1523 09/11/19 0339 09/12/19 0246  NA 144 143 143  K 4.2 3.7 3.6  CL 100 103 103  CO2 34* 33* 32  GLUCOSE 180* 181* 150*  BUN 22 25* 32*  CREATININE 1.26* 1.18 1.31*  CALCIUM 8.5* 8.3* 8.3*  MG  --   --  1.9   GFR: Estimated Creatinine Clearance: 53.9 mL/min (A) (by C-G formula based on SCr of 1.31 mg/dL (H)). Liver Function Tests: Recent Labs  Lab 09/09/19 1523  AST 16  ALT 16  ALKPHOS 102  BILITOT 1.1  PROT 6.0*  ALBUMIN 2.7*   No results for input(s): LIPASE, AMYLASE in the last 168 hours. No results for input(s): AMMONIA in the last 168 hours. Coagulation Profile: No results for input(s): INR, PROTIME in the last 168 hours. Cardiac Enzymes: No results for input(s): CKTOTAL, CKMB, CKMBINDEX, TROPONINI in the last 168 hours. BNP (last 3 results) No results for input(s): PROBNP in the last 8760 hours. HbA1C: No results for input(s): HGBA1C in the last 72 hours. CBG: Recent Labs  Lab 09/12/19 1729 09/12/19 2055 09/13/19 0646 09/13/19 0702 09/13/19 0719  GLUCAP 119* 85 25* 51* 144*   Lipid Profile: No results for input(s): CHOL, HDL, LDLCALC, TRIG, CHOLHDL, LDLDIRECT in the last 72 hours. Thyroid Function Tests: No results for input(s): TSH, T4TOTAL, FREET4, T3FREE, THYROIDAB in the last 72  hours. Anemia Panel: No results for input(s): VITAMINB12, FOLATE, FERRITIN, TIBC, IRON, RETICCTPCT in the last 72 hours. Sepsis Labs: No results for input(s): PROCALCITON, LATICACIDVEN in the last 168 hours.  Recent Results (from the past 240 hour(s))  Blood culture (routine x 2)     Status: None (Preliminary result)   Collection Time: 09/09/19  3:48 PM   Specimen: BLOOD  Result Value Ref Range Status   Specimen Description BLOOD RIGHT ANTECUBITAL  Final   Special Requests   Final    BOTTLES DRAWN AEROBIC AND ANAEROBIC Blood Culture adequate volume   Culture   Final    NO GROWTH 3 DAYS Performed at [facility], [address] [place], [place] [address]    Report Status PENDING  Incomplete  Blood culture (routine x 2)     Status: None (Preliminary result)   Collection Time: 09/09/19  4:37 PM   Specimen: BLOOD  Result Value Ref Range Status   Specimen Description BLOOD LEFT ANTECUBITAL  Final   Special Requests   Final    BOTTLES DRAWN AEROBIC AND ANAEROBIC Blood Culture results may not be optimal due to an inadequate volume of blood received in culture bottles   Culture   Final  NO GROWTH 3 DAYS Performed at [facility], [place] [address] [place], [place] [address]    Report Status PENDING  Incomplete  SARS CORONAVIRUS 2 (TAT 6-24 HRS) Nasopharyngeal Nasopharyngeal Swab     Status: None   Collection Time: 09/10/19  3:55 AM   Specimen: Nasopharyngeal Swab  Result Value Ref Range Status   SARS Coronavirus 2 NEGATIVE NEGATIVE Final    Comment: (NOTE) SARS-CoV-2 target nucleic acids are NOT DETECTED. The SARS-CoV-2 RNA is generally detectable in upper and lower respiratory specimens during the acute phase of infection. Negative results do not preclude SARS-CoV-2 infection, do not rule out co-infections with other pathogens, and should not be used as the sole basis for treatment or other patient management decisions. Negative results must be combined with clinical  observations, patient history, and epidemiological information. The expected result is Negative. Fact Sheet for Patients: SugarRoll.be Fact Sheet for Healthcare Providers: https://www.woods-mathews.com/ This test is not yet approved or cleared by the [place] FDA and  has been authorized for detection and/or diagnosis of SARS-CoV-2 by FDA under an Emergency Use Authorization (EUA). This EUA will remain  in effect (meaning this test can be used) for the duration of the COVID-19 declaration under Section 56 4(b)(1) of the Act, 21 U.S.C. section 360bbb-3(b)(1), unless the authorization is terminated or revoked sooner. Performed at [facility], [place] [address], [place], [place] [address]   MRSA PCR Screening     Status: None   Collection Time: 09/10/19  1:05 PM   Specimen: Nasal Mucosa; Nasopharyngeal  Result Value Ref Range Status   MRSA by PCR NEGATIVE NEGATIVE Final    Comment:        The GeneXpert MRSA Assay (FDA approved for NASAL specimens only), is one component of a comprehensive MRSA colonization surveillance program. It is not intended to diagnose MRSA infection nor to guide or monitor treatment for MRSA infections. Performed at [facility], [place] [address], [place], [place] [address]   Body fluid culture (includes gram stain)     Status: None (Preliminary result)   Collection Time: 09/12/19  2:54 PM   Specimen: Pleural Fluid  Result Value Ref Range Status   Specimen Description PLEURAL  Final   Special Requests Normal  Final   Gram Stain   Final    NO WBC SEEN NO ORGANISMS SEEN Performed at [facility], [place] [address], [place],  [address]    Culture PENDING  Incomplete   Report Status PENDING  Incomplete         Radiology Studies: CT CHEST WO CONTRAST  Result Date: 09/12/2019 CLINICAL DATA:  Chronic respiratory failure EXAM: CT CHEST WITHOUT CONTRAST TECHNIQUE: Multidetector CT imaging  of the chest was performed following the standard protocol without IV contrast. COMPARISON:  Chest x-ray from earlier in the same day, CT from 11/16/2018 FINDINGS: Cardiovascular: Somewhat limited due to lack of IV contrast. Aortic calcifications are seen without aneurysmal dilatation. Heavy coronary calcifications are noted. Cardiac shadow is at the upper limits of normal in size. Mediastinum/Nodes: Thoracic inlet shows a hypodense lesion within right lobe of the thyroid similar to that noted on the prior exam. Scattered mediastinal adenopathy is noted most marked in the right paratracheal region similar to that seen on the prior exam. Scattered smaller mediastinal nodes are seen. Evaluation for hilar adenopathy is limited due to the lack of IV contrast. The esophagus appears within normal limits. Lungs/Pleura: Volume loss is noted on the  right with mediastinal shift to the right. Small right-sided pleural effusion is noted but significantly decreased when compared with the prior CT examination. This may have a more loculated chronic component present. Prominent soft tissue component is seen which measures approximately 3.1 cm in greatest dimension decreased from 4.4 cm on the prior exam. The lower lobe rounded area of atelectatic change is again identified and relatively stable. No new focal mass is seen. Large left pleural effusion is seen with associated atelectatic change in the lower lobe. There is a vague area of increased density identified along the lateral aspect of the left hemithorax with extension into the intercostal space between the eighth and ninth ribs on the left. This was not seen on the prior exam and the possibility of a focal mass could not be totally excluded. Upper Abdomen: Visualized upper abdomen shows no acute abnormality. Musculoskeletal: Degenerative changes of the thoracic spine are seen. No acute rib abnormality is noted. 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 IMPRESSION:  Large left pleural effusion with associated basilar atelectasis. There is an area of soft tissue density which appears to extend into the inter costal space between the eighth and ninth ribs laterally. The possibility of underlying mass deserves consideration and thoracentesis with cytology evaluation is recommended. Persistent rounded densities within the right middle and right lower lobe when compared with the prior exam. The overall appearance has decreased somewhat in the interval and these again likely represent areas of rounded atelectasis and scarring. Small loculated right pleural effusion is noted. Stable right thyroid nodule measuring approximately 1 cm. No followup recommended (ref: J Am Coll Radiol. 2015 Feb;12(2): 143-50). Aortic Atherosclerosis (ICD10-I70.0). Electronically Signed   By: Inez Catalina M.D.   On: 09/12/2019 17:17   DG CHEST PORT 1 VIEW  Result Date: 09/12/2019 CLINICAL DATA:  Status post thoracentesis. EXAM: PORTABLE CHEST 1 VIEW COMPARISON:  September 10, 2019 FINDINGS: There is no pneumothorax. There is persistent, likely loculated right-sided pleural effusion. The heart size is mildly enlarged. Interstitial edema is again noted. The left-sided pleural effusion has improved from prior study. Aortic calcifications are noted. There is no acute osseous abnormality. IMPRESSION: 1. No pneumothorax. 2. Persistent moderate-sized loculated right-sided pleural effusion. Small left-sided pleural effusion. 3. Bilateral airspace disease which may represent atelectasis or consolidation, greatest within the right lung base. Electronically Signed   By: Constance Holster M.D.   On: 09/12/2019 15:10   VAS [place] ABI WITH/WO TBI  Result Date: 09/11/2019 LOWER EXTREMITY DOPPLER STUDY Indications: Ulceration, and gangrene.  Comparison Study: No prior study. Performing Technologist: Sharion Dove RVS  Examination Guidelines: A complete evaluation includes at minimum, Doppler waveform signals and systolic  blood pressure reading at the level of bilateral brachial, anterior tibial, and posterior tibial arteries, when vessel segments are accessible. Bilateral testing is considered an integral part of a complete examination. Photoelectric Plethysmograph (PPG) waveforms and toe systolic pressure readings are included as required and additional duplex testing as needed. Limited examinations for reoccurring indications may be performed as noted.  ABI Findings: +---------+------------------+-----+---------+--------+ Right    Rt Pressure (mmHg)IndexWaveform Comment  +---------+------------------+-----+---------+--------+ Brachial 127                    triphasic         +---------+------------------+-----+---------+--------+ PTA      136               1.07 biphasic          +---------+------------------+-----+---------+--------+ DP  156               1.23 biphasic          +---------+------------------+-----+---------+--------+ Great Toe79                0.62                   +---------+------------------+-----+---------+--------+ +---------+------------------+-----+---------+-------+ Left     Lt Pressure (mmHg)IndexWaveform Comment +---------+------------------+-----+---------+-------+ Brachial 124                    triphasic        +---------+------------------+-----+---------+-------+ PTA      112               0.88 biphasic         +---------+------------------+-----+---------+-------+ DP       159               1.25 biphasic         +---------+------------------+-----+---------+-------+ Great Toe70                0.55                  +---------+------------------+-----+---------+-------+ +-------+-----------+-----------+------------+------------+ ABI/TBIToday's ABIToday's TBIPrevious ABIPrevious TBI +-------+-----------+-----------+------------+------------+ Right  1.23       0.62                                 +-------+-----------+-----------+------------+------------+ Left   1.25       0.55                                +-------+-----------+-----------+------------+------------+  Summary: Right: Resting right ankle-brachial index is within normal range. No evidence of significant right lower extremity arterial disease. The right toe-brachial index is abnormal. Left: Resting left ankle-brachial index is within normal range. No evidence of significant left lower extremity arterial disease. The left toe-brachial index is abnormal.  *See table(s) above for measurements and observations.  Electronically signed by Deitra Mayo MD on 09/11/2019 at 5:04:43 PM.   Final         Scheduled Meds: . amLODipine  5 mg Oral Daily  . atorvastatin  20 mg Oral QPM  . dextrose      . docusate sodium  100 mg Oral BID  . donepezil  10 mg Oral QHS  . dorzolamide  1 drop Both Eyes BID  . feeding supplement (GLUCERNA SHAKE)  237 mL Oral TID BM  . finasteride  5 mg Oral QPM  . furosemide  40 mg Oral q1800  . furosemide  80 mg Oral Daily  . heparin injection (subcutaneous)  5,000 Units Subcutaneous Q8H  . insulin aspart  0-15 Units Subcutaneous TID WC  . insulin aspart  0-5 Units Subcutaneous QHS  . insulin aspart  6 Units Subcutaneous TID AC  . insulin glargine  40 Units Subcutaneous QHS  . lisinopril  10 mg Oral Daily  . metoprolol succinate  25 mg Oral Q breakfast  . multivitamin  1 tablet Oral Daily  . nutrition supplement (JUVEN)  1 packet Oral BID BM  . pantoprazole  40 mg Oral Daily  . tamsulosin  0.4 mg Oral Q breakfast   Continuous Infusions: . cefTRIAXone (ROCEPHIN)  IV 2 g (09/12/19 1343)  . metronidazole 500 mg (09/13/19 0315)  . vancomycin 1,000 mg (09/12/19 1525)  Aline August, MD [facility] 09/13/2019, 8:06 AM

## 2019-09-13 NOTE — Consult Note (Signed)
Consultation Note Date: 09/13/2019   Patient Name: Darrell Mcclain  DOB: 1941-03-27  MRN: [id number]  Age / Sex: 79 y.o., male  PCP: Burnard Bunting, MD Referring Physician: Aline August, MD  Reason for Consultation: Establishing goals of care  HPI/Patient Profile: 79 y.o. male  with past medical history of dementia, history of CVA, prostate cancer, diastolic CHF, atrial flutter on Xarelto, hypertension, hyperlipidemia, CKD stage 3, diabetes, BPH with indwelling catheter admitted from [facility] on 09/09/2019 with foot infection with L osteomyelitis of heel and superficial ulcer on R heel. Most likely to require left transtibial amputation.   Clinical Assessment and Goals of Care: I met today with Darrell Mcclain along with his friend/caregiver/Edie at bedside. We discussed his overall status, amputation, and overall goals of care. He is very unhappy at his current facility and expresses desire to change facilities. Family/friend are unhappy as they feel he is not cared for adequately and concern that his heel ulcer advanced too quickly and may have been able to catch the issue and intervene sooner. They understand that he may qualify for rehab and are interested in pursuing other options for long term care - will consult CMRN/CSW to follow and assist.   He speaks of declining QOL and speaks about dying and not worth living. I did ask him if he had ever thought about harming himself and he says "no." He also expresses his desire for DNR in front of friend. I clarified that I do not feel that he is currently at end of life but we always want to make sure that people are able to voice their wishes. He is also clear about his wishes to proceed with surgery. He is not ambulatory at current baseline but does get up to chair at facility. He enjoys sitting up.   He speaks freely and jokingly of death and dying and how we all  have to die at some point. He denies being scared of death himself. He agrees that we may share these wishes with his family. He is in good spirits and hopeful for more time but struggling with being in a facility away from his family and friends.   Primary Decision Maker PATIENT    SUMMARY OF RECOMMENDATIONS   - DNR has been decided - Amputation if needed - Hopeful for a different facility at d/c  Code Status/Advance Care Planning:  DNR   Symptom Management:   Per ortho and attending  Palliative Prophylaxis:   Delirium Protocol, Frequent Pain Assessment, Oral Care and Turn Reposition  Psycho-social/Spiritual:   Desire for further Chaplaincy support:no  Prognosis:   Unable to determine  Discharge Planning: [facility] for rehab with Palliative care service follow-up      Primary Diagnoses: Present on Admission: . Osteomyelitis ([place]) . Atrial flutter ([place]) . Chronic diastolic CHF (congestive heart failure), NYHA class 2 ([place]) . Essential hypertension . Hyperlipidemia . Malignant neoplasm of prostate ([place]) . Memory loss . Uncontrolled diabetes mellitus ([place]) . Morbid obesity ([place]) . Stage 3a chronic kidney  disease   I have reviewed the medical record, interviewed the patient and family, and examined the patient. The following aspects are pertinent.  Past Medical History:  Diagnosis Date  . Anticoagulant long-term use    xarelto  . Arthritis    right hand  . At risk for sleep apnea    STOP-BANG==  6      PT ALREADY SET UP TO HAVE STUDY AT [facility] , PCP AWARE  . Atrial flutter, chronic [facility]) dx 01/ 2015   cardiologist-  dr Angelena Form  . Bilateral lower extremity edema   . BPH (benign prostatic hyperplasia)   . Cerebral microvascular disease    advanced and multiple foci throughout hemispheres and brain stem-- residual memory issues  . Chronic constipation   . Chronic kidney disease (CKD), stage III (moderate)    nephrologist-  dr  Marval [facility])  . Diastolic CHF, chronic ([place])   . Exertional dyspnea   . Foley catheter in place   . GERD (gastroesophageal reflux disease)   . Glaucoma, both eyes   . History of adenomatous polyp of colon    2012  . History of CVA (cerebrovascular accident)    (pt had no symptoms other than memory issues) per MRI 11-29-2015  very small infarct right frontal lobe and multiple foci throughout hemispheres and brain stem  . 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 Hyperlipidemia   . Hypertension   . Iron deficiency anemia   . Memory loss   . Prostate cancer ([place])   . RBBB (right bundle branch block with left anterior fascicular block)   . Rosacea   . Type 2 diabetes mellitus ([place])   . Urine retention   . Vitamin B 12 deficiency    Social History   Socioeconomic History  . Marital status: Divorced    Spouse name: Not on file  . Number of children: Not on file  . Years of education: Not on file  . Highest education level: Not on file  Occupational History  . Not on file  Tobacco Use  . Smoking status: Former Smoker    Packs/day: 2.00    Years: 20.00    Pack years: 40.00    Quit date: 05/09/1983    Years since quitting: 36.3  . Smokeless tobacco: Never Used  Substance and Sexual Activity  . Alcohol use: No  . Drug use: No  . Sexual activity: Not Currently  Other Topics Concern  . Not on file  Social History Narrative   Lives at [facility] through rehab after falling and breaking right shoulder. Phone: [phone] Fax: [phone]      Social Determinants of Health   Financial Resource Strain:   . Difficulty of Paying Living Expenses: Not on file  Food Insecurity:   . Worried About Charity fundraiser in the Last Year: Not on file  . Ran Out of Food in the Last Year: Not on file  Transportation Needs:   . Lack of Transportation (Medical): Not on file  . Lack of Transportation (Non-Medical): Not on file  Physical  Activity:   . Days of Exercise per Week: Not on file  . Minutes of Exercise per Session: Not on file  Stress:   . Feeling of Stress : Not on file  Social Connections:   . Frequency of Communication with Friends and Family: Not on file  . Frequency of Social Gatherings with Friends and Family:  Not on file  . Attends Religious Services: Not on file  . Active Member of Clubs or Organizations: Not on file  . Attends Archivist Meetings: Not on file  . Marital Status: Not on file   Family History  Problem Relation Age of Onset  . Cancer Sister        lung to brain/smoker  . Colon cancer Neg Hx   . Rectal cancer Neg Hx   . Stomach cancer Neg Hx   . CAD Neg Hx   . Dementia Neg Hx    Scheduled Meds: . amLODipine  5 mg Oral Daily  . atorvastatin  20 mg Oral QPM  . dextrose      . docusate sodium  100 mg Oral BID  . donepezil  10 mg Oral QHS  . dorzolamide  1 drop Both Eyes BID  . feeding supplement (GLUCERNA SHAKE)  237 mL Oral TID BM  . finasteride  5 mg Oral QPM  . furosemide  40 mg Oral q1800  . furosemide  80 mg Oral Daily  . heparin injection (subcutaneous)  5,000 Units Subcutaneous Q8H  . lisinopril  10 mg Oral Daily  . metoprolol succinate  25 mg Oral Q breakfast  . multivitamin  1 tablet Oral Daily  . nutrition supplement (JUVEN)  1 packet Oral BID BM  . pantoprazole  40 mg Oral Daily  . tamsulosin  0.4 mg Oral Q breakfast   Continuous Infusions: . cefTRIAXone (ROCEPHIN)  IV 2 g (09/13/19 1053)  . metronidazole 500 mg (09/13/19 0905)  . vancomycin Stopped (09/12/19 1625)   PRN Meds:.acetaminophen **OR** acetaminophen, ondansetron **OR** ondansetron (ZOFRAN) IV Allergies  Allergen Reactions  . Ciprofloxacin Diarrhea  . Oxycodone Other (See Comments)    Altered mental status   Review of Systems  Constitutional: Positive for activity change, appetite change and fatigue.  Gastrointestinal: Positive for diarrhea.  Neurological: Positive for weakness.     Physical Exam Vitals and nursing note reviewed.  Cardiovascular:     Rate and Rhythm: Normal rate.  Pulmonary:     Effort: Pulmonary effort is normal. No tachypnea, accessory muscle usage or respiratory distress.  Abdominal:     General: Abdomen is flat.     Palpations: Abdomen is soft.  Neurological:     Mental Status: He is alert and oriented to person, place, and time.     Comments: Forgetful at times     Vital Signs: BP (!) 95/52 (BP Location: Right Arm)   Pulse 63   Temp 98.2 F (36.8 C) (Oral)   Resp 17   Ht 5' 7"  (1.702 m)   Wt 99.3 kg   SpO2 98%   BMI 34.29 kg/m  Pain Scale: 0-10 POSS *See Group Information*: 1-Acceptable,Awake and alert Pain Score: 0-No pain   SpO2: SpO2: 98 % O2 Device:SpO2: 98 % O2 Flow Rate: .O2 Flow Rate (L/min): 2 L/min  IO: Intake/output summary:   Intake/Output Summary (Last 24 hours) at 09/13/2019 1506 Last data filed at 09/13/2019 1100 Gross per 24 hour  Intake 1290 ml  Output 1002 ml  Net 288 ml    LBM: Last BM Date: 09/13/19 Baseline Weight: Weight: 95 kg Most recent weight: Weight: 99.3 kg     Palliative Assessment/Data:     Time In: 1500 Time Out: 1620 Time Total: 80 min Greater than 50%  of this time was spent counseling and coordinating care related to the above assessment and plan.  Signed by: Vinie Sill, NP  Palliative Medicine Team Pager # [phone] (M-F 8a-5p) Team Phone # [phone] (Nights/Weekends)

## 2019-09-13 NOTE — Progress Notes (Addendum)
Hypoglycemic Event  CBG: 25  Treatment: juice with a pack of sugar Symptoms: none Follow-up CBG: Time:0702 CBG Result 51  Possible Reasons for Event:not eating well Comments/MD notified:yes I later gave 82ml of D50  IV AND CBG came to 144 after 15 minutes check. Will update the day shift nurse to continue to monitor. Thanks!   Guinevere Scarlet

## 2019-09-14 ENCOUNTER — Inpatient Hospital Stay (HOSPITAL_COMMUNITY): Payer: Medicare Other | Admitting: Anesthesiology

## 2019-09-14 ENCOUNTER — Encounter (HOSPITAL_COMMUNITY)
Admission: EM | Disposition: A | Discharge status: Skilled Nursing Facility | Payer: Self-pay | Source: Home / Self Care | Attending: Internal Medicine

## 2019-09-14 ENCOUNTER — Encounter (HOSPITAL_COMMUNITY): Payer: Self-pay | Admitting: Internal Medicine

## 2019-09-14 ENCOUNTER — Telehealth: Payer: Self-pay | Admitting: Orthopedic Surgery

## 2019-09-14 DIAGNOSIS — M86272 Subacute osteomyelitis, left ankle and foot: Secondary | ICD-10-CM

## 2019-09-14 DIAGNOSIS — E876 Hypokalemia: Secondary | ICD-10-CM

## 2019-09-14 DIAGNOSIS — E7800 Pure hypercholesterolemia, unspecified: Secondary | ICD-10-CM

## 2019-09-14 DIAGNOSIS — F0150 Vascular dementia without behavioral disturbance: Secondary | ICD-10-CM

## 2019-09-14 DIAGNOSIS — J90 Pleural effusion, not elsewhere classified: Secondary | ICD-10-CM

## 2019-09-14 DIAGNOSIS — J9611 Chronic respiratory failure with hypoxia: Secondary | ICD-10-CM

## 2019-09-14 HISTORY — PX: AMPUTATION: SHX166

## 2019-09-14 LAB — BASIC METABOLIC PANEL
Anion gap: 8 (ref 5–15)
BUN: 39 mg/dL — ABNORMAL HIGH (ref 8–23)
CO2: 32 mmol/L (ref 22–32)
Calcium: 8.2 mg/dL — ABNORMAL LOW (ref 8.9–10.3)
Chloride: 100 mmol/L (ref 98–111)
Creatinine, Ser: 1.51 mg/dL — ABNORMAL HIGH (ref 0.61–1.24)
GFR calc Af Amer: 51 mL/min — ABNORMAL LOW (ref >60)
GFR calc non Af Amer: 44 mL/min — ABNORMAL LOW (ref >60)
Glucose, Bld: 129 mg/dL — ABNORMAL HIGH (ref 70–99)
Potassium: 4 mmol/L (ref 3.5–5.1)
Sodium: 140 mmol/L (ref 135–145)

## 2019-09-14 LAB — CBC WITH DIFFERENTIAL/PLATELET
Abs Immature Granulocytes: 0.04 10*3/uL (ref 0.00–0.07)
Basophils Absolute: 0.1 10*3/uL (ref 0.0–0.1)
Basophils Relative: 1 %
Eosinophils Absolute: 0.1 10*3/uL (ref 0.0–0.5)
Eosinophils Relative: 2 %
HCT: 36.2 % — ABNORMAL LOW (ref 39.0–52.0)
Hemoglobin: 10.4 g/dL — ABNORMAL LOW (ref 13.0–17.0)
Immature Granulocytes: 1 %
Lymphocytes Relative: 10 %
Lymphs Abs: 0.8 10*3/uL (ref 0.7–4.0)
MCH: 25.2 pg — ABNORMAL LOW (ref 26.0–34.0)
MCHC: 28.7 g/dL — ABNORMAL LOW (ref 30.0–36.0)
MCV: 87.9 fL (ref 80.0–100.0)
Monocytes Absolute: 0.8 10*3/uL (ref 0.1–1.0)
Monocytes Relative: 10 %
Neutro Abs: 6.3 10*3/uL (ref 1.7–7.7)
Neutrophils Relative %: 76 %
Platelets: 192 10*3/uL (ref 150–400)
RBC: 4.12 MIL/uL — ABNORMAL LOW (ref 4.22–5.81)
RDW: 17.7 % — ABNORMAL HIGH (ref 11.5–15.5)
WBC: 8.1 10*3/uL (ref 4.0–10.5)
nRBC: 0 % (ref 0.0–0.2)

## 2019-09-14 LAB — GLUCOSE, CAPILLARY
Glucose-Capillary: 117 mg/dL — ABNORMAL HIGH (ref 70–99)
Glucose-Capillary: 65 mg/dL — ABNORMAL LOW (ref 70–99)
Glucose-Capillary: 83 mg/dL (ref 70–99)
Glucose-Capillary: 79 mg/dL (ref 70–99)
Glucose-Capillary: 114 mg/dL — ABNORMAL HIGH (ref 70–99)
Glucose-Capillary: 184 mg/dL — ABNORMAL HIGH (ref 70–99)
Glucose-Capillary: 280 mg/dL — ABNORMAL HIGH (ref 70–99)

## 2019-09-14 LAB — CULTURE, BLOOD (ROUTINE X 2)
Culture: NO GROWTH
Culture: NO GROWTH
Special Requests: ADEQUATE

## 2019-09-14 LAB — MAGNESIUM: Magnesium: 1.9 mg/dL (ref 1.7–2.4)

## 2019-09-14 SURGERY — AMPUTATION BELOW KNEE
Anesthesia: Regional | Site: Knee | Laterality: Left

## 2019-09-14 MED ORDER — DEXAMETHASONE SODIUM PHOSPHATE 10 MG/ML IJ SOLN
INTRAMUSCULAR | Status: DC | PRN
  Administered 2019-09-14: 5 mg via INTRAVENOUS

## 2019-09-14 MED ORDER — DEXTROSE 50 % IV SOLN
INTRAVENOUS | Status: AC
  Filled 2019-09-14: qty 50

## 2019-09-14 MED ORDER — ONDANSETRON HCL 4 MG/2ML IJ SOLN: 4.0000 mg | Freq: Once | INTRAMUSCULAR | Status: DC | PRN

## 2019-09-14 MED ORDER — DEXAMETHASONE SODIUM PHOSPHATE 10 MG/ML IJ SOLN
INTRAMUSCULAR | Status: AC
  Filled 2019-09-14: qty 1

## 2019-09-14 MED ORDER — LACTATED RINGERS IV SOLN: INTRAVENOUS | Status: DC

## 2019-09-14 MED ORDER — PROPOFOL 10 MG/ML IV BOLUS
INTRAVENOUS | Status: AC
  Filled 2019-09-14: qty 20

## 2019-09-14 MED ORDER — METOCLOPRAMIDE HCL 5 MG PO TABS: 5.0000 mg | ORAL_TABLET | Freq: Three times a day (TID) | ORAL | Status: DC | PRN

## 2019-09-14 MED ORDER — LIDOCAINE 2% (20 MG/ML) 5 ML SYRINGE
INTRAMUSCULAR | Status: DC | PRN
  Administered 2019-09-14: 60 mg via INTRAVENOUS

## 2019-09-14 MED ORDER — ACETAMINOPHEN 325 MG PO TABS: 325.0000 mg | ORAL_TABLET | Freq: Four times a day (QID) | ORAL | Status: DC | PRN

## 2019-09-14 MED ORDER — EPHEDRINE SULFATE-NACL 50-0.9 MG/10ML-% IV SOSY
PREFILLED_SYRINGE | INTRAVENOUS | Status: DC | PRN
  Administered 2019-09-14 (×2): 10 mg via INTRAVENOUS

## 2019-09-14 MED ORDER — LIDOCAINE 2% (20 MG/ML) 5 ML SYRINGE
INTRAMUSCULAR | Status: AC
  Filled 2019-09-14: qty 5

## 2019-09-14 MED ORDER — ACETAMINOPHEN 160 MG/5ML PO SOLN: 325.0000 mg | ORAL | Status: DC | PRN

## 2019-09-14 MED ORDER — OXYCODONE HCL 5 MG/5ML PO SOLN: 5.0000 mg | Freq: Once | ORAL | Status: DC | PRN

## 2019-09-14 MED ORDER — FENTANYL CITRATE (PF) 100 MCG/2ML IJ SOLN: 25.0000 ug | INTRAMUSCULAR | Status: DC | PRN

## 2019-09-14 MED ORDER — MEPERIDINE HCL 25 MG/ML IJ SOLN: 6.2500 mg | INTRAMUSCULAR | Status: DC | PRN

## 2019-09-14 MED ORDER — PHENYLEPHRINE 40 MCG/ML (10ML) SYRINGE FOR IV PUSH (FOR BLOOD PRESSURE SUPPORT)
PREFILLED_SYRINGE | INTRAVENOUS | Status: AC
  Filled 2019-09-14: qty 10

## 2019-09-14 MED ORDER — DOCUSATE SODIUM 100 MG PO CAPS
100.0000 mg | ORAL_CAPSULE | Freq: Two times a day (BID) | ORAL | Status: DC
  Administered 2019-09-14 – 2019-09-22 (×11): 100 mg via ORAL
  Filled 2019-09-14 (×12): qty 1

## 2019-09-14 MED ORDER — VANCOMYCIN HCL 750 MG/150ML IV SOLN
750.0000 mg | INTRAVENOUS | Status: DC
  Administered 2019-09-14: 750 mg via INTRAVENOUS
  Filled 2019-09-14 (×2): qty 150

## 2019-09-14 MED ORDER — CEFAZOLIN SODIUM-DEXTROSE 2-4 GM/100ML-% IV SOLN
2.0000 g | INTRAVENOUS | Status: AC
  Administered 2019-09-14: 2 g via INTRAVENOUS
  Filled 2019-09-14: qty 100

## 2019-09-14 MED ORDER — BUPIVACAINE HCL (PF) 0.5 % IJ SOLN
INTRAMUSCULAR | Status: DC | PRN
  Administered 2019-09-14: 10 mL via PERINEURAL
  Administered 2019-09-14: 15 mL via PERINEURAL

## 2019-09-14 MED ORDER — ONDANSETRON HCL 4 MG/2ML IJ SOLN
INTRAMUSCULAR | Status: DC | PRN
  Administered 2019-09-14: 4 mg via INTRAVENOUS

## 2019-09-14 MED ORDER — METOCLOPRAMIDE HCL 5 MG/ML IJ SOLN: 5.0000 mg | Freq: Three times a day (TID) | INTRAMUSCULAR | Status: DC | PRN

## 2019-09-14 MED ORDER — ENSURE MAX PROTEIN PO LIQD
11.0000 [oz_av] | Freq: Every day | ORAL | Status: DC
  Administered 2019-09-15 – 2019-09-19 (×4): 11 [oz_av] via ORAL
  Filled 2019-09-14 (×6): qty 330

## 2019-09-14 MED ORDER — MIDAZOLAM HCL 2 MG/2ML IJ SOLN
INTRAMUSCULAR | Status: AC
  Filled 2019-09-14: qty 2

## 2019-09-14 MED ORDER — 0.9 % SODIUM CHLORIDE (POUR BTL) OPTIME
TOPICAL | Status: DC | PRN
  Administered 2019-09-14: 1000 mL

## 2019-09-14 MED ORDER — FENTANYL CITRATE (PF) 250 MCG/5ML IJ SOLN
INTRAMUSCULAR | Status: AC
  Filled 2019-09-14: qty 5

## 2019-09-14 MED ORDER — PHENYLEPHRINE 40 MCG/ML (10ML) SYRINGE FOR IV PUSH (FOR BLOOD PRESSURE SUPPORT)
PREFILLED_SYRINGE | INTRAVENOUS | Status: DC | PRN
  Administered 2019-09-14 (×2): 80 ug via INTRAVENOUS
  Administered 2019-09-14 (×2): 120 ug via INTRAVENOUS

## 2019-09-14 MED ORDER — GABAPENTIN 300 MG PO CAPS
300.0000 mg | ORAL_CAPSULE | Freq: Three times a day (TID) | ORAL | Status: DC
  Administered 2019-09-14 – 2019-09-16 (×6): 300 mg via ORAL
  Filled 2019-09-14 (×6): qty 1

## 2019-09-14 MED ORDER — ONDANSETRON HCL 4 MG/2ML IJ SOLN: 4.0000 mg | Freq: Four times a day (QID) | INTRAMUSCULAR | Status: DC | PRN

## 2019-09-14 MED ORDER — ONDANSETRON HCL 4 MG/2ML IJ SOLN
INTRAMUSCULAR | Status: AC
  Filled 2019-09-14: qty 2

## 2019-09-14 MED ORDER — MORPHINE SULFATE (PF) 2 MG/ML IV SOLN
0.5000 mg | INTRAVENOUS | Status: DC | PRN
  Filled 2019-09-14: qty 1

## 2019-09-14 MED ORDER — EPHEDRINE 5 MG/ML INJ
INTRAVENOUS | Status: AC
  Filled 2019-09-14: qty 10

## 2019-09-14 MED ORDER — FENTANYL CITRATE (PF) 100 MCG/2ML IJ SOLN: 75.0000 ug | Freq: Once | INTRAMUSCULAR | Status: AC

## 2019-09-14 MED ORDER — HYDROCODONE-ACETAMINOPHEN 5-325 MG PO TABS
1.0000 | ORAL_TABLET | ORAL | Status: DC | PRN
  Administered 2019-09-18 – 2019-09-21 (×4): 1 via ORAL
  Administered 2019-09-21 – 2019-09-22 (×2): 2 via ORAL
  Filled 2019-09-14: qty 2
  Filled 2019-09-14: qty 1
  Filled 2019-09-14: qty 2
  Filled 2019-09-14 (×3): qty 1
  Filled 2019-09-14: qty 2

## 2019-09-14 MED ORDER — OXYCODONE HCL 5 MG PO TABS: 5.0000 mg | ORAL_TABLET | Freq: Once | ORAL | Status: DC | PRN

## 2019-09-14 MED ORDER — ONDANSETRON HCL 4 MG PO TABS: 4.0000 mg | ORAL_TABLET | Freq: Four times a day (QID) | ORAL | Status: DC | PRN

## 2019-09-14 MED ORDER — ACETAMINOPHEN 325 MG PO TABS: 325.0000 mg | ORAL_TABLET | ORAL | Status: DC | PRN

## 2019-09-14 MED ORDER — DEXTROSE 50 % IV SOLN
25.0000 mL | Freq: Once | INTRAVENOUS | Status: AC
  Administered 2019-09-14: 25 mL via INTRAVENOUS

## 2019-09-14 MED ORDER — PROPOFOL 10 MG/ML IV BOLUS
INTRAVENOUS | Status: DC | PRN
  Administered 2019-09-14: 110 mg via INTRAVENOUS

## 2019-09-14 MED ORDER — BUPIVACAINE LIPOSOME 1.3 % IJ SUSP
INTRAMUSCULAR | Status: DC | PRN
  Administered 2019-09-14: 10 mL

## 2019-09-14 MED ORDER — FENTANYL CITRATE (PF) 100 MCG/2ML IJ SOLN
INTRAMUSCULAR | Status: AC
  Administered 2019-09-14: 75 ug via INTRAVENOUS
  Filled 2019-09-14: qty 2

## 2019-09-14 SURGICAL SUPPLY — 43 items
BLADE SAW RECIP 87.9 MT (BLADE) ×2 IMPLANT
BLADE SURG 21 STRL SS (BLADE) ×2 IMPLANT
BNDG COHESIVE 6X5 TAN STRL LF (GAUZE/BANDAGES/DRESSINGS) IMPLANT
CANISTER PREVENA PLUS 150 (CANNISTER) ×1 IMPLANT
CANISTER WOUND CARE 500ML ATS (WOUND CARE) ×2 IMPLANT
COVER SURGICAL LIGHT HANDLE (MISCELLANEOUS) ×2 IMPLANT
COVER WAND RF STERILE (DRAPES) IMPLANT
CUFF TOURN SGL QUICK 34 (TOURNIQUET CUFF) ×1
CUFF TRNQT CYL 34X4.125X (TOURNIQUET CUFF) ×1 IMPLANT
DRAPE INCISE IOBAN 66X45 STRL (DRAPES) ×2 IMPLANT
DRAPE U-SHAPE 47X51 STRL (DRAPES) ×2 IMPLANT
DRESSING PREVENA PLUS CUSTOM (GAUZE/BANDAGES/DRESSINGS) ×1 IMPLANT
DRSG PREVENA PLUS CUSTOM (GAUZE/BANDAGES/DRESSINGS) ×4
DURAPREP 26ML APPLICATOR (WOUND CARE) ×2 IMPLANT
ELECT REM PT RETURN 9FT ADLT (ELECTROSURGICAL) ×2
ELECTRODE REM PT RTRN 9FT ADLT (ELECTROSURGICAL) ×1 IMPLANT
GLOVE BIO SURGEON STRL SZ 6.5 (GLOVE) ×3 IMPLANT
GLOVE BIOGEL PI IND STRL 7.0 (GLOVE) IMPLANT
GLOVE BIOGEL PI IND STRL 9 (GLOVE) ×1 IMPLANT
GLOVE BIOGEL PI INDICATOR 7.0 (GLOVE) ×1
GLOVE BIOGEL PI INDICATOR 9 (GLOVE) ×1
GLOVE INDICATOR 7.5 STRL GRN (GLOVE) ×1 IMPLANT
GLOVE SURG ORTHO 9.0 STRL STRW (GLOVE) ×2 IMPLANT
GOWN STRL REUS W/ TWL XL LVL3 (GOWN DISPOSABLE) ×2 IMPLANT
GOWN STRL REUS W/TWL XL LVL3 (GOWN DISPOSABLE) ×2
KIT BASIN OR (CUSTOM PROCEDURE TRAY) ×2 IMPLANT
KIT TURNOVER KIT B (KITS) ×2 IMPLANT
MANIFOLD NEPTUNE II (INSTRUMENTS) ×2 IMPLANT
NS IRRIG 1000ML POUR BTL (IV SOLUTION) ×2 IMPLANT
PACK ORTHO EXTREMITY (CUSTOM PROCEDURE TRAY) ×2 IMPLANT
PAD ARMBOARD 7.5X6 YLW CONV (MISCELLANEOUS) ×2 IMPLANT
PREVENA RESTOR ARTHOFORM 46X30 (CANNISTER) ×2 IMPLANT
PREVENA RESTOR AXIOFORM 29X28 (GAUZE/BANDAGES/DRESSINGS) ×1 IMPLANT
SPONGE LAP 18X18 RF (DISPOSABLE) IMPLANT
STAPLER VISISTAT 35W (STAPLE) IMPLANT
STOCKINETTE IMPERVIOUS LG (DRAPES) ×2 IMPLANT
SUT ETHILON 2 0 PSLX (SUTURE) IMPLANT
SUT SILK 2 0 (SUTURE) ×1
SUT SILK 2-0 18XBRD TIE 12 (SUTURE) ×1 IMPLANT
SUT VIC AB 1 CTX 27 (SUTURE) ×4 IMPLANT
TOWEL GREEN STERILE (TOWEL DISPOSABLE) ×2 IMPLANT
TUBE CONNECTING 12X1/4 (SUCTIONS) ×2 IMPLANT
YANKAUER SUCT BULB TIP NO VENT (SUCTIONS) ×2 IMPLANT

## 2019-09-14 NOTE — Progress Notes (Signed)
Palliative:  HPI: 79 y.o. male  with past medical history of dementia, history of CVA, prostate cancer, diastolic CHF, atrial flutter on Xarelto, hypertension, hyperlipidemia, CKD stage 3, diabetes, BPH with indwelling catheter admitted from [facility] on 09/09/2019 with foot infection with L osteomyelitis of heel and superficial ulcer on R heel. Most likely to require left transtibial amputation. 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   I met today at Darrell Mcclain bedside with him and his friend, Darrell Mcclain. Darrell Mcclain is smiling and in good spirits. He only complains of slight discomfort in right hip and we reposition him in the bed. Darrell Mcclain is on phone with daughter, Darrell Mcclain. I spoke further with Darrell Mcclain who has good understanding of her father's health conditions. She confirms his wishes for DNR is consistent with his documented wishes. Family is very concerned about how quickly his wound progressed to requiring L BKA when they did not know he even had wounds. They are very motivated to put in place any and all measures to closely monitor right heel wound so that this does not progress to this stage. They will follow closely with Dr. [name] and express desire for wound care following his care. There is some mistrust of facility to care for his wounds. We also discussed follow up from outpatient palliative care to assist with goals and communication as well. I also encouraged family to attempt to build relationships with Education officer, museum or nursing staff so that they have someone they can trust to help inform them to his status. CSW notified of likely upcoming discharge and family concerns.   Upon further discussion with Darrell Mcclain he expresses that he was unaware that his foot was amputated. He tells me that he thought they were just going to amputate part of his foot. I explained to him that after all the testing and evaluation that the surgeon needed to remove his left leg below his knee in order to allow healing from the infection that  was down to his bone. He expresses understanding. He his handling this news better than expected. He is very forgetful with underlying dementia and may have times that he forgets the extent of his surgery and amputation.   All questions/concerns addressed. Emotional support provided.    Exam: Alert, mostly oriented but very forgetful. He is very jovial and jokes which helps to compensate for his confusion. No distress. Good spirits. New L BKA to wound vac. Right foot floated. Good upper body strength and able to pull himself up in bed.   Plan: - To SNF rehab with outpatient palliative to follow. Please recommend outpatient palliative referral in d/c summary.  - Family requesting close follow up with wound care for close monitoring of right heal ulcer. They prefer out of facility visit with wound care clinic.   [place], NP Palliative Medicine Team Pager [phone] (Please see [email or web address] for schedule) Team Phone [phone]    Greater than 50%  of this time was spent counseling and coordinating care related to the above assessment and plan

## 2019-09-14 NOTE — Telephone Encounter (Signed)
We can definitely refer to wound center, however they will not see him until he is 90 days out from surgery, we will provide wound care until then

## 2019-09-14 NOTE — Anesthesia Procedure Notes (Signed)
Anesthesia Procedure Image    

## 2019-09-14 NOTE — Anesthesia Procedure Notes (Signed)
Anesthesia Regional Block: Adductor canal block   Pre-Anesthetic Checklist: ,, timeout performed, Correct Patient, Correct Site, Correct Laterality, Correct Procedure, Correct Position, site marked, Risks and benefits discussed,  Surgical consent,  Pre-op evaluation,  At surgeon's request and post-op pain management  Laterality: Left  Prep: chloraprep       Needles:  Injection technique: Single-shot  Needle Type: Echogenic Needle     Needle Length: 9cm      Additional Needles:   Procedures:,,,, ultrasound used (permanent image in chart),,,,  Narrative:  Start time: 09/14/2019 10:43 AM End time: 09/14/2019 10:48 AM Injection made incrementally with aspirations every 5 mL.  Performed by: Personally  Anesthesiologist: Myrtie Soman, MD  Additional Notes: Patient tolerated the procedure well without complications

## 2019-09-14 NOTE — Care Management Important Message (Signed)
Important Message  Patient Details  Name: Darrell Mcclain MRN: [id number] Date of Birth: [birth year]   Medicare Important Message Given:  Yes     Orbie Pyo 09/14/2019, 2:57 PM

## 2019-09-14 NOTE — Progress Notes (Signed)
Pharmacy Antibiotic Note  Darrell Mcclain is a 79 y.o. male admitted on 09/09/2019 with gangrenous ulcer and osteo on L heel. Pharmacy has been consulted for Vancomycin dosing. Pt is also on ceftriaxone and Flagyl per MD.    S/p L BKA today.  Planning antibiotics for 24 hrs post-op.    Creatinine has trended up to 1.51.    Plan: Decrease Vancomycin from 1gm > 750 mg IV q24hrs (due at 4pm) For estimated AUC 412 Continue ceftriaxone 2 gm IV q24h Continue Flagyl 500 mg IV q8h Follow up for stopping antibiotics 24hrs post-op. Follow renal function.   May need to consider holding Lasix and Lisinopril for a few days.   Temp (24hrs), Avg:98 F (36.7 C), Min:97.4 F (36.3 C), Max:98.7 F (37.1 C)  Recent Labs  Lab 09/09/19 1523 09/11/19 0339 09/12/19 0246 09/13/19 0627 09/14/19 0315  WBC 9.7 9.1 8.1 10.7* 8.1  CREATININE 1.26* 1.18 1.31* 1.41* 1.51*    Estimated Creatinine Clearance: 46.7 mL/min (A) (by C-G formula based on SCr of 1.51 mg/dL (H)).    Allergies  Allergen Reactions  . Ciprofloxacin Diarrhea  . Oxycodone Other (See Comments)    Altered mental status    Antimicrobials this admission: Vancomycin 1/2 >> Ceftriaxone 1/2 >> Flagyl 1/2  >> Zosyn x 1 dose on 1/2  Microbiology this admission:  1/2 COVID negative 1/2 MRSA PCR negative 1/1 Blood x 2: negative 1/4 pleural fluid: no growth x 2 days to date  Claudie Fisherman Phone: [phone]

## 2019-09-14 NOTE — Op Note (Signed)
   Date of Surgery: 09/14/2019  INDICATIONS: Mr. Legan is a 79 y.o.-year-old male who presents with gangrenous necrotic left heel ulcer with osteomyelitis of the left calcaneus with protein caloric malnutrition uncontrolled type 2 diabetes and significant venous insufficiency.  PREOPERATIVE DIAGNOSIS: Osteomyelitis left calcaneus with necrotic ulcer left heel  POSTOPERATIVE DIAGNOSIS: Same.  PROCEDURE: Transtibial amputation Application of Prevena wound VAC  SURGEON: [name], M.D.  ANESTHESIA:  general  IV FLUIDS AND URINE: See anesthesia.  ESTIMATED BLOOD LOSS: Minimal mL.  COMPLICATIONS: None.  DESCRIPTION OF PROCEDURE: The patient was brought to the operating room and underwent a general anesthetic. After adequate levels of anesthesia were obtained patient's lower extremity was prepped using DuraPrep draped into a sterile field. A timeout was called. The foot was draped out of the sterile field with impervious stockinette. A transverse incision was made 11 cm distal to the tibial tubercle. This curved proximally and a large posterior flap was created. The tibia was transected 1 cm proximal to the skin incision. The fibula was transected just proximal to the tibial incision. The tibia was beveled anteriorly. A large posterior flap was created. The sciatic nerve was pulled cut and allowed to retract. The vascular bundles were suture ligated with 2-0 silk. The deep and superficial fascial layers were closed using #1 Vicryl. The skin was closed using staples and 2-0 nylon. The wound was covered with a Prevena wound VAC. There was a good suction fit. A prosthetic shrinker was applied. Patient was extubated taken to the PACU in stable condition.   DISCHARGE PLANNING:  Antibiotic duration: Continue antibiotics for 24 hours  Weightbearing: Nonweightbearing on the left  Pain medication: Opioid pathway  Dressing care/ Wound VAC: Continue wound VAC at discharge for 1 week  Discharge to:  Skilled nursing  Follow-up: In the office 1 week post operative.  Meridee Score, MD [place] 11:37 AM

## 2019-09-14 NOTE — Progress Notes (Addendum)
Nutrition Follow-up  RD working remotely.  DOCUMENTATION CODES:   Obesity unspecified  INTERVENTION:   -D/c Glucerna Shake po TID, each supplement provides 220 kcal and 10 grams of protein -Continue MVI with minerals daily -Continue 1 packet Juven BID, each packet provides 95 calories, 2.5 grams of protein (collagen), and 9.8 grams of carbohydrate (3 grams sugar); also contains 7 grams of L-arginine and L-glutamine, 300 mg vitamin C, 15 mg vitamin E, 1.2 mcg vitamin B-12, 9.5 mg zinc, 200 mg calcium, and 1.5 g  Calcium Beta-hydroxy-Beta-methylbutyrate to support wound healing -Ensure Max po daily, each supplement provides 150 kcal and 30 grams of protein.   NUTRITION DIAGNOSIS:   Increased nutrient needs related to wound healing as evidenced by estimated needs.  Ongoing  GOAL:   Patient will meet greater than or equal to 90% of their needs  Progressing   MONITOR:   PO intake, Weight trends, Supplement acceptance, Skin, I & O's, Labs  REASON FOR ASSESSMENT:   Consult Wound healing  ASSESSMENT:   79 year old male with past medical history of T2DM, prostate cancer, vascular dementia, HTN, HLD, h/o CVA, BPH with h/o chronic indwelling foley (not present on admission), chronic diastolic CHF, CKD3, chronic respiratory failure on home O2, and afib who presented with left foot ulceration and admitted for osteomyelitis of left foot.  1/4- s/p thoracentesis, 100 ml cloudy fluid removed  Reviewed I/O's: +506 ml x 24 hours and -1.1 L since admission  UOP: 900 ml x 24 hours  Per [facility]OCN note on 09/09/18, pt with stage 3 pressure injury to rt heel and unstageable pressure injury to lt heel.   Per RN notes, pt with decreased appetite this AM and was unable to finish Ensure surgery drink. Noted meal completion 50-100% per doc flowsheets.  Palliative care following for goals of care discussions. Pt amenable to lt BKA, which per orthopedics, will be performed today (currently down in OR  for procedure).   Medications reviewed and include colace  Labs reviewed: CBGS: 65-219 (inpatient orders for glycemic control are 0-5 units insulin aspart q HS and 0-9 units insulin aspart TID with meals).   Diet Order:   Diet Order            Diet NPO time specified Except for: Sips with Meds, Ice Chips  Diet effective midnight              EDUCATION NEEDS:   Not appropriate for education at this time  Skin:  Skin Assessment: Skin Integrity Issues: Skin Integrity Issues:: Unstageable, Stage III DTI: - Stage III: rt heel Unstageable: lt heel  Last BM:  09/13/18  Height:   Ht Readings from Last 1 Encounters:  09/10/19 5\' 7"  (1.702 m)    Weight:   Wt Readings from Last 1 Encounters:  09/13/19 99.3 kg    Ideal Body Weight:  62.9 kg(adjusted for lt BKA)  BMI:  Body mass index is 34.29 kg/m.  Estimated Nutritional Needs:   Kcal:  2000-2200  Protein:  110-125 grams  Fluid:  > 2 L    Crystel Demarco A. Jimmye Norman, RD, LDN, [place] Registered Dietitian II Certified Diabetes Care and Education Specialist Pager: [phone] After hours Pager: [phone]

## 2019-09-14 NOTE — Anesthesia Preprocedure Evaluation (Addendum)
Anesthesia Evaluation  Patient identified by MRN, date of birth, ID band Patient awake    Reviewed: Allergy & Precautions, NPO status , Patient's Chart, lab work & pertinent test results  History of Anesthesia Complications Negative for: history of anesthetic complications  Airway Mallampati: II  TM Distance: >3 FB Neck ROM: Full    Dental no notable dental hx. (+) Dental Advisory Given   Pulmonary neg pulmonary ROS, former smoker,    Pulmonary exam normal        Cardiovascular hypertension, Pt. on medications and Pt. on home beta blockers +CHF  Normal cardiovascular exam+ dysrhythmias Atrial Fibrillation   Impressions:  - Normal LV systolic function; mild LVH; grade 1 diastolic   dysfunction.    Neuro/Psych CVA, No Residual Symptoms negative neurological ROS  negative psych ROS   GI/Hepatic Neg liver ROS, GERD  Medicated,  Endo/Other  diabetes, Insulin Dependent, Oral Hypoglycemic Agents  Renal/GU CRFRenal disease  negative genitourinary   Musculoskeletal negative musculoskeletal ROS (+) Arthritis ,   Abdominal   Peds negative pediatric ROS (+)  Hematology  (+) Blood dyscrasia, anemia ,   Anesthesia Other Findings   Reproductive/Obstetrics negative OB ROS                            Anesthesia Physical  Anesthesia Plan  ASA: III  Anesthesia Plan: General   Post-op Pain Management: GA combined w/ Regional for post-op pain   Induction: Intravenous  PONV Risk Score and Plan: 2 and Ondansetron, Dexamethasone and Treatment may vary due to age or medical condition  Airway Management Planned: LMA  Additional Equipment:   Intra-op Plan:   Post-operative Plan: Extubation in OR  Informed Consent: I have reviewed the patients History and Physical, chart, labs and discussed the procedure including the risks, benefits and alternatives for the proposed anesthesia with the patient  or authorized representative who has indicated his/her understanding and acceptance.     Dental advisory given  Plan Discussed with: CRNA, Anesthesiologist and Surgeon  Anesthesia Plan Comments: (Discussed both nerve block for pain relief post-op and GA; including NV, sore throat, dental injury, and pulmonary complications)       Anesthesia Quick Evaluation

## 2019-09-14 NOTE — Telephone Encounter (Signed)
Pt is a BKA today and pt's daughter calling to see if they can have a referral to a wound care center.

## 2019-09-14 NOTE — Telephone Encounter (Signed)
Patient's daughter called. She wants to discuss getting him a referral to wound care specialist.   Call back number: [phone]

## 2019-09-14 NOTE — Progress Notes (Signed)
Hypoglycemic Event  CBG:65  Treatment: 25 ml of D50 Symptoms: NNE Follow-up CBG: [id number] CBG Result:117  Possible Reasons for Event: lost of appetite, didn't finished his PRE ENSURE DRINK  Comments/MD notified:YES  Will update incoming nurse to follow up and monitor.  Darrell Mcclain

## 2019-09-14 NOTE — Anesthesia Procedure Notes (Signed)
Anesthesia Regional Block: Popliteal block   Pre-Anesthetic Checklist: ,, timeout performed, Correct Patient, Correct Site, Correct Laterality, Correct Procedure, Correct Position, site marked, Risks and benefits discussed,  Surgical consent,  Pre-op evaluation,  At surgeon's request and post-op pain management  Laterality: Left  Prep: chloraprep       Needles:  Injection technique: Single-shot  Needle Type: Echogenic Needle     Needle Length: 9cm      Additional Needles:   Procedures:,,,, ultrasound used (permanent image in chart),,,,  Narrative:  Start time: 09/14/2019 10:32 AM End time: 09/14/2019 10:40 AM Injection made incrementally with aspirations every 5 mL.  Performed by: Personally  Anesthesiologist: Myrtie Soman, MD  Additional Notes: Patient tolerated the procedure well without complications

## 2019-09-14 NOTE — Anesthesia Procedure Notes (Signed)
Procedure Name: LMA Insertion Date/Time: 09/14/2019 11:11 AM Performed by: Trinna Post., CRNA Pre-anesthesia Checklist: Patient identified, Emergency Drugs available, Suction available, Patient being monitored and Timeout performed Patient Re-evaluated:Patient Re-evaluated prior to induction Oxygen Delivery Method: Circle system utilized Preoxygenation: Pre-oxygenation with 100% oxygen Induction Type: IV induction LMA: LMA inserted LMA Size: 4.0 Number of attempts: 1 Placement Confirmation: positive ETCO2 and breath sounds checked- equal and bilateral Tube secured with: Tape Dental Injury: Teeth and Oropharynx as per pre-operative assessment

## 2019-09-14 NOTE — Evaluation (Signed)
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 Pt to benefit from acute PT to address deficits. Pt required mod-max assist for bed mobility, pt tolerated sitting EOB with min guard for ~8 minutes before fatiguing. PT recommending CIR to maximize mobility s/p amputation, pt and pt's caregiver are motivated and want pt to maximize function prior to d/c. PT to progress mobility as tolerated, and will continue to follow acutely.      Follow Up Recommendations CIR    Equipment Recommendations  None recommended by PT    Recommendations for Other Services       Precautions / Restrictions Precautions Precautions: Fall Restrictions Weight Bearing Restrictions: Yes LLE Weight Bearing: Non weight bearing      Mobility  Bed Mobility Overal bed mobility: Needs Assistance Bed Mobility: Supine to Sit;Sit to Supine     Supine to sit: Mod assist;HOB elevated Sit to supine: Max assist;HOB elevated   General bed mobility comments: mod-max assist for supine<>sit for trunk elevation/lowering, LE management, and scooting to and from EOB with use of bed pad. Boost function utilized to scoot pt up in bed.  Transfers                 General transfer comment: not assessed this session, limited by pt fatigue and POD0  Ambulation/Gait                Stairs             Wheelchair Mobility    Modified Rankin (Stroke Patients Only)       Balance Overall balance assessment: Needs assistance;History of Falls Sitting-balance support: No upper extremity supported;Feet supported   Sitting balance - Comments: Pt sat EOB ~1 minute without UE support before posterior leaning and requiring min assist from PT to correct Postural control: Posterior lean     Standing balance comment: not assessed                             Pertinent Vitals/Pain Pain Assessment: Faces Faces Pain Scale: Hurts a little bit Pain Location: L residual limb Pain Descriptors / Indicators: Sore Pain Intervention(s): Limited activity within patient's tolerance;Monitored during session;Repositioned    Home Living Family/patient expects to be discharged to:: Inpatient rehab                      Prior Function Level of Independence: Needs assistance   Gait / Transfers Assistance Needed: pt reports doing squat pivot with some assist from SNF staff PTA. Pt reports he has not ambulated since late October, when he had femur fracture s/p fall. Pt reports PT/OT have not been working with [facility] at [facility].  ADL's / Homemaking Assistance Needed: Pt reports getting assist for dressing/bathing at SNF.        Hand Dominance   Dominant Hand: Right    Extremity/Trunk Assessment   Upper Extremity Assessment Upper Extremity Assessment: Defer to OT evaluation  Lower Extremity Assessment Lower Extremity Assessment: Generalized weakness;RLE deficits/detail;LLE deficits/detail RLE Deficits / Details: full AROM hip and knee flexion/extension observed via heel slide, at least 3/5 knee extension LLE Deficits / Details: able to perform hip extension, knee extension hip abduction/adduction, and knee flexion without PT assist    Cervical / Trunk Assessment Cervical / Trunk Assessment: Normal  Communication   Communication: No difficulties  Cognition Arousal/Alertness:  Awake/alert Behavior During Therapy: WFL for tasks assessed/performed Overall Cognitive Status: Within Functional Limits for tasks assessed                                 General Comments: Pt pleasant and cooperative with PT      General Comments General comments (skin integrity, edema, etc.): R heel ulcer, bandaged and prevalon boot applied.    Exercises     Assessment/Plan    PT Assessment Patient needs continued PT services  PT Problem List Decreased strength;Decreased mobility;Decreased activity tolerance;Decreased balance;Decreased knowledge of use of DME;Cardiopulmonary status limiting activity;Decreased range of motion;Decreased safety awareness;Pain       PT Treatment Interventions DME instruction;Therapeutic activities;Therapeutic exercise;Patient/family education;Balance training;Functional mobility training    PT Goals (Current goals can be found in the Care Plan section)  Acute Rehab PT Goals Patient Stated Goal: get stronger, get a prosthesis PT Goal Formulation: With patient/family Time For Goal Achievement: 09/28/19 Potential to Achieve Goals: Good    Frequency Min 3X/week   Barriers to discharge        Co-evaluation               AM-PAC PT "6 Clicks" Mobility  Outcome Measure Help needed turning from your back to your side while in a flat bed without using bedrails?: A Lot Help needed moving from lying on your back to sitting on the side of a flat bed without using bedrails?: A Lot Help needed moving to and from a bed to a chair (including a wheelchair)?: Total Help needed standing up from a chair using your arms (e.g., wheelchair or bedside chair)?: Total Help needed to walk in hospital room?: Total Help needed climbing 3-5 steps with a railing? : Total 6 Click Score: 8    End of Session Equipment Utilized During Treatment: Gait belt Activity Tolerance: Patient tolerated treatment well;Patient limited by fatigue Patient left: in  bed;with bed alarm set;with call bell/phone within reach;with family/visitor present Nurse Communication: Mobility status PT Visit Diagnosis: Unsteadiness on feet (R26.81);Other abnormalities of gait and mobility (R26.89)    Time: 6759-1638 PT Time Calculation (min) (ACUTE ONLY): 33 min   Charges:   PT Evaluation $PT Eval Low Complexity: 1 Low PT Treatments $Therapeutic Activity: 8-22 mins      Marynell Bies E, PT Acute Rehabilitation Services Pager [phone]  Office [phone]   Farrie Sann D Elonda Husky 09/14/2019, 5:33 PM

## 2019-09-14 NOTE — Anesthesia Postprocedure Evaluation (Signed)
Anesthesia Post Note  Patient: Darrell Mcclain  Procedure(s) Performed: LEFT BELOW KNEE AMPUTATION (Left Knee)     Patient location during evaluation: PACU Anesthesia Type: General Level of consciousness: awake and alert Pain management: pain level controlled Vital Signs Assessment: post-procedure vital signs reviewed and stable Respiratory status: spontaneous breathing, nonlabored ventilation, respiratory function stable and patient connected to nasal cannula oxygen Cardiovascular status: blood pressure returned to baseline and stable Postop Assessment: no apparent nausea or vomiting Anesthetic complications: no    Last Vitals:  Vitals:   09/14/19 1213 09/14/19 1224  BP: 123/60 124/61  Pulse: 61 67  Resp: 19 (!) 27  Temp:  36.5 C  SpO2: 98% 100%    Last Pain:  Vitals:   09/14/19 1213  TempSrc:   PainSc: 0-No pain                 Lateesha Bezold S

## 2019-09-14 NOTE — Interval H&P Note (Signed)
History and Physical Interval Note:  09/14/2019 7:22 AM  Darrell Mcclain  has presented today for surgery, with the diagnosis of GANGRENE LEFT HEEL.  The various methods of treatment have been discussed with the patient and family. After consideration of risks, benefits and other options for treatment, the patient has consented to  Procedure(s): LEFT BELOW KNEE AMPUTATION (Left) as a surgical intervention.  The patient's history has been reviewed, patient examined, no change in status, stable for surgery.  I have reviewed the patient's chart and labs.  Questions were answered to the patient's satisfaction.     Newt Minion

## 2019-09-14 NOTE — Progress Notes (Addendum)
PROGRESS NOTE    Elwyn Klosinski  BOF:[id number] DOB: 22-May-1941 DOA: 09/09/2019 PCP: Burnard Bunting, MD    Brief Narrative:  79 year old male with history of diabetes mellitus type II, prostate cancer, dementia, hypertension, hyperlipidemia, unspecified CVA, BPH with history of chronic indwelling Foley (not currently present), chronic diastolic CHF, chronic kidney disease stage III, chronic respiratory failure on home oxygen, atrial flutter on Xarelto presented with foot infection.  MRI of the left foot showed osteomyelitis.  He was started on broad-spectrum antibiotics.  Orthopedics was consulted.    Assessment & Plan:   Principal Problem:   Osteomyelitis ([place]) Active Problems:   Atrial flutter (HCC)   Chronic diastolic CHF (congestive heart failure), NYHA class 2 (HCC)   Memory loss   Uncontrolled diabetes mellitus ([place])   Morbid obesity (HCC)   Hyperlipidemia   Essential hypertension   Malignant neoplasm of prostate (HCC)   Stage 3a chronic kidney disease   Diabetic foot infection ([place])   Moderate protein-calorie malnutrition ([place])   Goals of care, counseling/discussion   Palliative care encounter  1 gangrenous left heel ulcer with acute osteomyelitis MRI of the left heel done consistent with left heel osteomyelitis.  ABIs were done.  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 [name].  Continue broad-spectrum antibiotics for another 24 to 48 hours or per recommendations from orthopedics.  Pain management.  Patient with wound VAC on and management per orthopedics.  2.  Chronic hypoxic respiratory failure on 2 L home O2/loculated chronic right sided pleural effusion CT chest which was done showed increased size of left pleural effusion, moderate now, persistent stable right pleural effusion and suspected atelectasis in the right lung base also with stable mediastinal lymphadenopathy.  Chest x-ray done on admission showed slight  increase in right loculated moderate pleural effusion.  Pulmonary was consulted and patient subsequently underwent ultrasound-guided thoracentesis of the right with fluid analysis consistent with exudative, lymphocytic predominant with 1100 nucleated cells with lymphocytosis but no malignancy identified.  Per pulmonary likely related to chronic medical conditions and suspected will reaccumulate like he did in the past.  Per pulmonary no further recommendations at this time.  Follow.  3.  Chronic diastolic heart failure/paroxysmal atrial flutter 2D echo from 07/01/2019 with a EF of 50 to 55% with moderate TR and severely elevated pulmonary artery pressure.  Patient with a urine output of 900 cc over the past 24 hours.  Patient with sats of 99% on 2 L nasal cannula.  Continue current dose Lasix, Lipitor, Norvasc, lisinopril, Toprol-XL.  Continue Toprol for rate control.  Xarelto held in anticipation of surgical intervention.  Orthopedics to advise when Xarelto may be resumed.  Follow.  4.  Moderate protein calorie malnutrition  5.  Diabetes mellitus type 2 Hemoglobin A1c 6.8.  Lantus was discontinued as patient noted to have a hypoglycemic episode the morning of 09/13/2019.  Sliding scale insulin.  6.  Chronic kidney disease stage III Stable.  7.  Hypokalemia Repleted.  8.  Anemia of chronic disease H&H stable.  Follow.  9.  Hyperlipidemia Continue statin.  10.  Hypertension Continue Norvasc, lisinopril, Toprol-XL, Lasix.  11.  History of prostate cancer Status post treatment in 2018  12.  Vascular dementia Stable.  Continue Aricept.  Fall precautions.  Palliative care consulted.   DVT prophylaxis: Heparin Code Status: DNR Family Communication: Updated patient and family friend at bedside. Disposition Plan: CIR versus SNF   Consultants:   Orthopedics: Dr.[name] 09/10/2019  Orthopedics: Dr.  Swinteck 09/10/2019  Wound care  Rib/PCCM: Dr. Valeta Harms 09/12/2019  Palliative care: Vinie Sill, NP/Dr. Rowe Pavy 09/13/2019  Procedures:   Transtibial amputation left lower extremity per Dr. [name] 09/14/2019  CT chest 09/12/2019  Chest x-ray 09/10/2019, 09/12/2019  Plain films of the orbits 09/10/2019  Plain films of the left foot 09/09/2019  MRI right heel 09/10/2019  MRI left heel 09/10/2019  ABIs with/without TBI 09/11/2019  Ultrasound-guided thoracentesis per Dr. Tamala Julian, pulmonary 09/12/2019  Antimicrobials:   IV vancomycin 09/10/2019>>>>  IV Flagyl 09/10/2019>>>>>>  IV Rocephin 09/10/2019>>>>>  IV Zosyn x1 dose 09/10/2019   Subjective: Just returned from the PACU/surgery for transtibial amputation.  Denies any chest pain.  No shortness of breath.  Objective: Vitals:   09/14/19 1158 09/14/19 1213 09/14/19 1224 09/14/19 1242  BP: 113/62 123/60 124/61 133/75  Pulse: 67 61 67 68  Resp: 17 19 (!) 27 16  Temp:   97.7 F (36.5 C) (!) 97.4 F (36.3 C)  TempSrc:    Oral  SpO2: 98% 98% 100% 99%  Weight:      Height:        Intake/Output Summary (Last 24 hours) at 09/14/2019 1330 Last data filed at 09/14/2019 1125 Gross per 24 hour  Intake 560 ml  Output 927 ml  Net -367 ml   Filed Weights   09/10/19 0944 09/13/19 0500  Weight: 95 kg 99.3 kg    Examination:  General exam: Appears calm and comfortable  Respiratory system: Clear to auscultation anterior lung fields. Respiratory effort normal. Cardiovascular system: S1 & S2 heard, RRR. No JVD, murmurs, rubs, gallops or clicks. No pedal edema. Gastrointestinal system: Abdomen is nondistended, soft and nontender. No organomegaly or masses felt. Normal bowel sounds heard. Central nervous system: Alert and oriented. No focal neurological deficits. Extremities: s/p left transtibial amputation with wound VAC.  Skin: No rashes, lesions or ulcers Psychiatry: Judgement and insight appear normal. Mood & affect appropriate.     Data Reviewed: I have personally reviewed following labs and imaging studies  CBC: Recent Labs  Lab  09/09/19 1523 09/11/19 0339 09/12/19 0246 09/13/19 0627 09/14/19 0315  WBC 9.7 9.1 8.1 10.7* 8.1  NEUTROABS 8.1*  --  6.4 7.8* 6.3  HGB 11.7* 10.4* 11.0* 10.7* 10.4*  HCT 42.1 37.0* 39.3 36.7* 36.2*  MCV 89.0 87.7 88.3 86.2 87.9  PLT 255 220 214 218 485   Basic Metabolic Panel: Recent Labs  Lab 09/09/19 1523 09/11/19 0339 09/12/19 0246 09/13/19 0627 09/14/19 0315  NA 144 143 143 142 140  K 4.2 3.7 3.6 3.4* 4.0  CL 100 103 103 101 100  CO2 34* 33* 32 31 32  GLUCOSE 180* 181* 150* 26* 129*  BUN 22 25* 32* 36* 39*  CREATININE 1.26* 1.18 1.31* 1.41* 1.51*  CALCIUM 8.5* 8.3* 8.3* 8.2* 8.2*  MG  --   --  1.9 1.9 1.9   GFR: Estimated Creatinine Clearance: 46.7 mL/min (A) (by C-G formula based on SCr of 1.51 mg/dL (H)). Liver Function Tests: Recent Labs  Lab 09/09/19 1523  AST 16  ALT 16  ALKPHOS 102  BILITOT 1.1  PROT 6.0*  ALBUMIN 2.7*   No results for input(s): LIPASE, AMYLASE in the last 168 hours. No results for input(s): AMMONIA in the last 168 hours. Coagulation Profile: No results for input(s): INR, PROTIME in the last 168 hours. Cardiac Enzymes: No results for input(s): CKTOTAL, CKMB, CKMBINDEX, TROPONINI in the last 168 hours. BNP (last 3 results) No results for input(s): PROBNP in  the last 8760 hours. HbA1C: No results for input(s): HGBA1C in the last 72 hours. CBG: Recent Labs  Lab 09/13/19 1648 09/13/19 2135 09/14/19 0644 09/14/19 0717 09/14/19 0845  GLUCAP 233* 219* 65* 117* 83   Lipid Profile: No results for input(s): CHOL, HDL, LDLCALC, TRIG, CHOLHDL, LDLDIRECT in the last 72 hours. Thyroid Function Tests: No results for input(s): TSH, T4TOTAL, FREET4, T3FREE, THYROIDAB in the last 72 hours. Anemia Panel: No results for input(s): VITAMINB12, FOLATE, FERRITIN, TIBC, IRON, RETICCTPCT in the last 72 hours. Sepsis Labs: No results for input(s): PROCALCITON, LATICACIDVEN in the last 168 hours.  Recent Results (from the past 240 hour(s))    Blood culture (routine x 2)     Status: None (Preliminary result)   Collection Time: 09/09/19  3:48 PM   Specimen: BLOOD  Result Value Ref Range Status   Specimen Description BLOOD RIGHT ANTECUBITAL  Final   Special Requests   Final    BOTTLES DRAWN AEROBIC AND ANAEROBIC Blood Culture adequate volume   Culture   Final    NO GROWTH 4 DAYS Performed at [facility], [address] [place], [place] [address]    Report Status PENDING  Incomplete  Blood culture (routine x 2)     Status: None (Preliminary result)   Collection Time: 09/09/19  4:37 PM   Specimen: BLOOD  Result Value Ref Range Status   Specimen Description BLOOD LEFT ANTECUBITAL  Final   Special Requests   Final    BOTTLES DRAWN AEROBIC AND ANAEROBIC Blood Culture results may not be optimal due to an inadequate volume of blood received in culture bottles   Culture   Final    NO GROWTH 4 DAYS Performed at [facility], [place] [address], [place], [place] [address]    Report Status PENDING  Incomplete  SARS CORONAVIRUS 2 (TAT 6-24 HRS) Nasopharyngeal Nasopharyngeal Swab     Status: None   Collection Time: 09/10/19  3:55 AM   Specimen: Nasopharyngeal Swab  Result Value Ref Range Status   SARS Coronavirus 2 NEGATIVE NEGATIVE Final    Comment: (NOTE) SARS-CoV-2 target nucleic acids are NOT DETECTED. The SARS-CoV-2 RNA is generally detectable in upper and lower respiratory specimens during the acute phase of infection. Negative results do not preclude SARS-CoV-2 infection, do not rule out co-infections with other pathogens, and should not be used as the sole basis for treatment or other patient management decisions. Negative results must be combined with clinical observations, patient history, and epidemiological information. The expected result is Negative. Fact Sheet for Patients: SugarRoll.be Fact Sheet for Healthcare Providers: https://www.woods-mathews.com/ This  test is not yet approved or cleared by the [place] FDA and  has been authorized for detection and/or diagnosis of SARS-CoV-2 by FDA under an Emergency Use Authorization (EUA). This EUA will remain  in effect (meaning this test can be used) for the duration of the COVID-19 declaration under Section 56 4(b)(1) of the Act, 21 U.S.C. section 360bbb-3(b)(1), unless the authorization is terminated or revoked sooner. Performed at [facility], [place] [address] [place], [place] [address]   MRSA PCR Screening     Status: None   Collection Time: 09/10/19  1:05 PM   Specimen: Nasal Mucosa; Nasopharyngeal  Result Value Ref Range Status   MRSA by PCR NEGATIVE NEGATIVE Final    Comment:        The GeneXpert MRSA Assay (FDA approved for NASAL specimens only), is one component of a comprehensive MRSA  colonization surveillance program. It is not intended to diagnose MRSA infection nor to guide or monitor treatment for MRSA infections. Performed at [facility], [place] [address], [place], [place] [address]   Body fluid culture (includes gram stain)     Status: None (Preliminary result)   Collection Time: 09/12/19  2:54 PM   Specimen: Pleural Fluid  Result Value Ref Range Status   Specimen Description PLEURAL  Final   Special Requests Normal  Final   Gram Stain NO WBC SEEN NO ORGANISMS SEEN   Final   Culture   Final    NO GROWTH 2 DAYS Performed at [facility], [place] [address], [place],  [address]    Report Status PENDING  Incomplete         Radiology Studies: CT CHEST WO CONTRAST  Result Date: 09/12/2019 CLINICAL DATA:  Chronic respiratory failure EXAM: CT CHEST WITHOUT CONTRAST TECHNIQUE: Multidetector CT imaging of the chest was performed following the standard protocol without IV contrast. COMPARISON:  Chest x-ray from earlier in the same day, CT from 11/16/2018 FINDINGS: Cardiovascular: Somewhat limited due to lack of IV contrast. Aortic calcifications  are seen without aneurysmal dilatation. Heavy coronary calcifications are noted. Cardiac shadow is at the upper limits of normal in size. Mediastinum/Nodes: Thoracic inlet shows a hypodense lesion within right lobe of the thyroid similar to that noted on the prior exam. Scattered mediastinal adenopathy is noted most marked in the right paratracheal region similar to that seen on the prior exam. Scattered smaller mediastinal nodes are seen. Evaluation for hilar adenopathy is limited due to the lack of IV contrast. The esophagus appears within normal limits. Lungs/Pleura: Volume loss is noted on the right with mediastinal shift to the right. Small right-sided pleural effusion is noted but significantly decreased when compared with the prior CT examination. This may have a more loculated chronic component present. Prominent soft tissue component is seen which measures approximately 3.1 cm in greatest dimension decreased from 4.4 cm on the prior exam. The lower lobe rounded area of atelectatic change is again identified and relatively stable. No new focal mass is seen. Large left pleural effusion is seen with associated atelectatic change in the lower lobe. There is a vague area of increased density identified along the lateral aspect of the left hemithorax with extension into the intercostal space between the eighth and ninth ribs on the left. This was not seen on the prior exam and the possibility of a focal mass could not be totally excluded. Upper Abdomen: Visualized upper abdomen shows no acute abnormality. Musculoskeletal: Degenerative changes of the thoracic spine are seen. No acute rib abnormality is noted. 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 IMPRESSION: Large left pleural effusion with associated basilar atelectasis. There is an area of soft tissue density which appears to extend into the inter costal space between the eighth and ninth ribs laterally. The possibility of underlying mass deserves  consideration and thoracentesis with cytology evaluation is recommended. Persistent rounded densities within the right middle and right lower lobe when compared with the prior exam. The overall appearance has decreased somewhat in the interval and these again likely represent areas of rounded atelectasis and scarring. Small loculated right pleural effusion is noted. Stable right thyroid nodule measuring approximately 1 cm. No followup recommended (ref: J Am Coll Radiol. 2015 Feb;12(2): 143-50). Aortic Atherosclerosis (ICD10-I70.0). Electronically Signed   By: Inez Catalina M.D.   On: 09/12/2019 17:17   DG CHEST PORT 1  VIEW  Result Date: 09/12/2019 CLINICAL DATA:  Status post thoracentesis. EXAM: PORTABLE CHEST 1 VIEW COMPARISON:  September 10, 2019 FINDINGS: There is no pneumothorax. There is persistent, likely loculated right-sided pleural effusion. The heart size is mildly enlarged. Interstitial edema is again noted. The left-sided pleural effusion has improved from prior study. Aortic calcifications are noted. There is no acute osseous abnormality. IMPRESSION: 1. No pneumothorax. 2. Persistent moderate-sized loculated right-sided pleural effusion. Small left-sided pleural effusion. 3. Bilateral airspace disease which may represent atelectasis or consolidation, greatest within the right lung base. Electronically Signed   By: Constance Holster M.D.   On: 09/12/2019 15:10        Scheduled Meds: . amLODipine  5 mg Oral Daily  . atorvastatin  20 mg Oral QPM  . dextrose      . docusate sodium  100 mg Oral BID  . docusate sodium  100 mg Oral BID  . donepezil  10 mg Oral QHS  . dorzolamide  1 drop Both Eyes BID  . finasteride  5 mg Oral QPM  . furosemide  40 mg Oral q1800  . furosemide  80 mg Oral Daily  . gabapentin  300 mg Oral TID  . heparin injection (subcutaneous)  5,000 Units Subcutaneous Q8H  . insulin aspart  0-5 Units Subcutaneous QHS  . insulin aspart  0-9 Units Subcutaneous TID WC  .  lisinopril  10 mg Oral Daily  . metoprolol succinate  25 mg Oral Q breakfast  . multivitamin  1 tablet Oral Daily  . nutrition supplement (JUVEN)  1 packet Oral BID BM  . pantoprazole  40 mg Oral Daily  . [START ON 09/15/2019] Ensure Max Protein  11 oz Oral QHS  . tamsulosin  0.4 mg Oral Q breakfast   Continuous Infusions: . cefTRIAXone (ROCEPHIN)  IV 2 g (09/13/19 1053)  . lactated ringers 10 mL/hr at 09/14/19 0954  . metronidazole 500 mg (09/14/19 0223)  . vancomycin 1,000 mg (09/13/19 1842)     LOS: 4 days    Time spent: 35 minutes    Irine Seal, MD [facility]  If 7PM-7AM, please contact night-coverage [email or web address] 09/14/2019, 1:30 PM

## 2019-09-14 NOTE — Clinical Social Work Note (Addendum)
From [facility], not a long term resident so will therefore need a new insurance authorization.   CSW has alerted the liaison Janie to start auth.   Need PT evaluation in order to submit auth.   Will also need an outpatient referral for palliative care.

## 2019-09-14 NOTE — Transfer of Care (Signed)
Immediate Anesthesia Transfer of Care Note  Patient: Darrell Mcclain  Procedure(s) Performed: LEFT BELOW KNEE AMPUTATION (Left Knee)  Patient Location: PACU  Anesthesia Type:GA combined with regional for post-op pain  Level of Consciousness: awake, alert  and oriented  Airway & Oxygen Therapy: Patient Spontanous Breathing and Patient connected to face mask oxygen  Post-op Assessment: Report given to RN and Post -op Vital signs reviewed and stable  Post vital signs: Reviewed and stable  Last Vitals:  Vitals Value Taken Time  BP 113/62 09/14/19 1143  Temp    Pulse 72 09/14/19 1147  Resp 15 09/14/19 1147  SpO2 92 % 09/14/19 1147  Vitals shown include unvalidated device data.  Last Pain:  Vitals:   09/14/19 0807  TempSrc: Oral  PainSc:       Patients Stated Pain Goal: 1 (74/71/[id number])  Complications: No apparent anesthesia complications

## 2019-09-15 LAB — CBC WITH DIFFERENTIAL/PLATELET
Abs Immature Granulocytes: 0.07 10*3/uL (ref 0.00–0.07)
Basophils Absolute: 0 10*3/uL (ref 0.0–0.1)
Basophils Relative: 0 %
Eosinophils Absolute: 0 10*3/uL (ref 0.0–0.5)
Eosinophils Relative: 0 %
HCT: 35.6 % — ABNORMAL LOW (ref 39.0–52.0)
Hemoglobin: 10 g/dL — ABNORMAL LOW (ref 13.0–17.0)
Immature Granulocytes: 1 %
Lymphocytes Relative: 5 %
Lymphs Abs: 0.5 10*3/uL — ABNORMAL LOW (ref 0.7–4.0)
MCH: 24.5 pg — ABNORMAL LOW (ref 26.0–34.0)
MCHC: 28.1 g/dL — ABNORMAL LOW (ref 30.0–36.0)
MCV: 87.3 fL (ref 80.0–100.0)
Monocytes Absolute: 0.5 10*3/uL (ref 0.1–1.0)
Monocytes Relative: 6 %
Neutro Abs: 7.5 10*3/uL (ref 1.7–7.7)
Neutrophils Relative %: 88 %
Platelets: 186 10*3/uL (ref 150–400)
RBC: 4.08 MIL/uL — ABNORMAL LOW (ref 4.22–5.81)
RDW: 17.3 % — ABNORMAL HIGH (ref 11.5–15.5)
WBC: 8.6 10*3/uL (ref 4.0–10.5)
nRBC: 0 % (ref 0.0–0.2)

## 2019-09-15 LAB — GLUCOSE, CAPILLARY
Glucose-Capillary: 230 mg/dL — ABNORMAL HIGH (ref 70–99)
Glucose-Capillary: 322 mg/dL — ABNORMAL HIGH (ref 70–99)
Glucose-Capillary: 266 mg/dL — ABNORMAL HIGH (ref 70–99)
Glucose-Capillary: 235 mg/dL — ABNORMAL HIGH (ref 70–99)

## 2019-09-15 LAB — BASIC METABOLIC PANEL
Anion gap: 13 (ref 5–15)
BUN: 48 mg/dL — ABNORMAL HIGH (ref 8–23)
CO2: 28 mmol/L (ref 22–32)
Calcium: 8.1 mg/dL — ABNORMAL LOW (ref 8.9–10.3)
Chloride: 99 mmol/L (ref 98–111)
Creatinine, Ser: 1.72 mg/dL — ABNORMAL HIGH (ref 0.61–1.24)
GFR calc Af Amer: 44 mL/min — ABNORMAL LOW (ref >60)
GFR calc non Af Amer: 38 mL/min — ABNORMAL LOW (ref >60)
Glucose, Bld: 258 mg/dL — ABNORMAL HIGH (ref 70–99)
Potassium: 4.6 mmol/L (ref 3.5–5.1)
Sodium: 140 mmol/L (ref 135–145)

## 2019-09-15 LAB — PH, BODY FLUID: pH, Body Fluid: 8

## 2019-09-15 LAB — BODY FLUID CULTURE
Culture: NO GROWTH
Gram Stain: NONE SEEN
Special Requests: NORMAL

## 2019-09-15 LAB — SURGICAL PATHOLOGY

## 2019-09-15 MED ORDER — INSULIN GLARGINE 100 UNIT/ML ~~LOC~~ SOLN
5.0000 [IU] | Freq: Every day | SUBCUTANEOUS | Status: DC
  Administered 2019-09-15: 5 [IU] via SUBCUTANEOUS
  Filled 2019-09-15 (×3): qty 0.05

## 2019-09-15 NOTE — Progress Notes (Signed)
Inpatient Diabetes Program Recommendations  AACE/ADA: New Consensus Statement on Inpatient Glycemic Control (2015)  Target Ranges:  Prepandial:   less than 140 mg/dL      Peak postprandial:   less than 180 mg/dL (1-2 hours)      Critically ill patients:  140 - 180 mg/dL   Lab Results  Component Value Date   GLUCAP 322 (H) 09/15/2019   HGBA1C 6.8 (H) 09/09/2019    Review of Glycemic Control  Diabetes history: type 2 Outpatient Diabetes medications: Lantus 10 units BID, Novolog 2-9 units sliding scale TID & HS Current orders for Inpatient glycemic control: Lantus 5 units daily, Novolog SENSITIVE correction scale TID & HS  Inpatient Diabetes Program Recommendations:   Noted that blood sugars that have been greater than 180 mg/dl. Getting nutrtional supplements that can elevate blood sugars.   Recommend increasing Lantus to 10 units daily. If blood sugars continue to be greater than 200 mg/dl, recommend increasing Novolog correction scale to MODERATE TID & HS.    Harvel Ricks RN BSN CDE Diabetes Coordinator Pager: [phone]  8am-5pm

## 2019-09-15 NOTE — Progress Notes (Signed)
Inpatient Rehab Admissions:  Inpatient Rehab Consult received.  I met with patient and his caregiver, Nena Jordan, at the bedside for rehabilitation assessment and to discuss goals and expectations of an inpatient rehab admission.  Pt sleeping throughout my visit.  Nena Jordan states that prior to admission pt was a resident of [facility].  Will need to clarify dispo prior to admission and will need to follow up with pt's daughters tomorrow.    Signed: Shann Medal, PT, DPT Admissions Coordinator [phone] 09/15/19  4:01 PM

## 2019-09-15 NOTE — Progress Notes (Signed)
PROGRESS NOTE    Darrell Mcclain  PPJ:[id number] DOB: 04-Jul-1941 DOA: 09/09/2019 PCP: Burnard Bunting, MD    Brief Narrative:  79 year old male with history of diabetes mellitus type II, prostate cancer, dementia, hypertension, hyperlipidemia, unspecified CVA, BPH with history of chronic indwelling Foley (not currently present), chronic diastolic CHF, chronic kidney disease stage III, chronic respiratory failure on home oxygen, atrial flutter on Xarelto presented with foot infection.  MRI of the left foot showed osteomyelitis.  He was started on broad-spectrum antibiotics.  Orthopedics was consulted.    Assessment & Plan:   Principal Problem:   Osteomyelitis ([place]) Active Problems:   Atrial flutter (HCC)   Chronic diastolic CHF (congestive heart failure), NYHA class 2 (HCC)   Memory loss   Uncontrolled diabetes mellitus ([place])   Morbid obesity (HCC)   Hyperlipidemia   Essential hypertension   Malignant neoplasm of prostate (HCC)   Stage 3a chronic kidney disease   Diabetic foot infection ([place])   Moderate protein-calorie malnutrition ([place])   Goals of care, counseling/discussion   Palliative care encounter   Pleural effusion   Chronic respiratory failure with hypoxia (HCC)   Vascular dementia without behavioral disturbance (HCC)   Hypokalemia  1 gangrenous left heel ulcer with acute osteomyelitis MRI of the left heel done consistent with left heel osteomyelitis.  ABIs were done.  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 [name].  Continue broad-spectrum antibiotics for another 24 hours and discontinue tomorrow.  Will DC IV vancomycin today.  Continue pain management.  Patient with wound VAC on and management per orthopedics.  2.  Chronic hypoxic respiratory failure on 2 L home O2/loculated chronic right sided pleural effusion CT chest which was done showed increased size of left pleural effusion, moderate now, persistent stable  right pleural effusion and suspected atelectasis in the right lung base also with stable mediastinal lymphadenopathy.  Chest x-ray done on admission showed slight increase in right loculated moderate pleural effusion.  Pulmonary was consulted and patient subsequently underwent ultrasound-guided thoracentesis of the right with fluid analysis consistent with exudative, lymphocytic predominant with 1100 nucleated cells with lymphocytosis but no malignancy identified.  Per pulmonary likely related to chronic medical conditions and suspected will reaccumulate like he did in the past.  Per pulmonary no further recommendations at this time.  Follow.  3.  Chronic diastolic heart failure/paroxysmal atrial flutter 2D echo from 07/01/2019 with a EF of 50 to 55% with moderate TR and severely elevated pulmonary artery pressure.  Patient with a urine output of 900 cc over the past 24 hours.  Patient with sats of 99% on 2 L nasal cannula.  Blood pressure somewhat borderline.  We will hold Lasix and lisinopril.  Continue Lipitor Norvasc and Toprol-XL.  Continue Toprol for rate control.  Xarelto held in anticipation of surgical intervention.  Orthopedics to advise when Xarelto may be resumed.  Follow.  4.  Moderate protein calorie malnutrition  5.  Diabetes mellitus type 2 Hemoglobin A1c 6.8.  Lantus was discontinued as patient noted to have a hypoglycemic episode the morning of 09/13/2019.  CBG of 230 this morning.  We will start Lantus at 5 units daily and uptitrate as needed.  Continue sliding scale insulin.  6.  Chronic kidney disease stage III Stable.  Creatinine close to baseline.  Follow.  7.  Hypokalemia Repleted.  8.  Anemia of chronic disease H&H stable at 10.0.  Follow.  9.  Hyperlipidemia Continue statin.  10.  Hypertension Blood pressure  borderline.  Continue Norvasc and Toprol-XL.  Discontinue Lasix and lisinopril as patient with worsening renal function.  Follow.   11.  History of prostate  cancer Status post treatment in 2018  12.  Vascular dementia Continue Aricept.  Fall precautions.  Palliative care consulted and are following.    DVT prophylaxis: Heparin Code Status: DNR Family Communication: Updated patient.  No family at bedside.  Disposition Plan: CIR versus SNF   Consultants:   Orthopedics: Dr.[name] 09/10/2019  Orthopedics: Dr. Lyla Glassing 09/10/2019  Wound care  Rib/PCCM: Dr. Valeta Harms 09/12/2019  Palliative care: Vinie Sill, NP/Dr. Rowe Pavy 09/13/2019  Inpatient rehab  Procedures:   Transtibial amputation left lower extremity per Dr. [name] 09/14/2019  CT chest 09/12/2019  Chest x-ray 09/10/2019, 09/12/2019  Plain films of the orbits 09/10/2019  Plain films of the left foot 09/09/2019  MRI right heel 09/10/2019  MRI left heel 09/10/2019  ABIs with/without TBI 09/11/2019  Ultrasound-guided thoracentesis per Dr. Tamala Julian, pulmonary 09/12/2019  Antimicrobials:   IV vancomycin 09/10/2019>>>> 09/15/2019  IV Flagyl 09/10/2019>>>>>>  IV Rocephin 09/10/2019>>>>>  IV Zosyn x1 dose 09/10/2019   Subjective: Sleeping however easily arousable.  Denies any chest pain or shortness of breath.   Objective: Vitals:   09/15/19 0029 09/15/19 0317 09/15/19 0759 09/15/19 1000  BP: (!) 105/52 114/65 92/62 92/62   Pulse: 62 (!) 58 67 67  Resp: 17 16 15    Temp: 97.7 F (36.5 C) (!) 97.5 F (36.4 C) (!) 97.5 F (36.4 C)   TempSrc: Oral Oral Oral   SpO2: 92% 97% 95%   Weight:      Height:        Intake/Output Summary (Last 24 hours) at 09/15/2019 1233 Last data filed at 09/14/2019 2300 Gross per 24 hour  Intake 60 ml  Output 200 ml  Net -140 ml   Filed Weights   09/10/19 0944 09/13/19 0500  Weight: 95 kg 99.3 kg    Examination:  General exam: NAD. Respiratory system: CTAB anterior lung fields.  Normal respiratory effort. Cardiovascular system: Regular rate rhythm no murmurs rubs or gallops.  No JVD.  No lower extremity edema.  Gastrointestinal system: Abdomen is soft, nontender,  nondistended, positive bowel sounds.  No rebound.  No guarding. Central nervous system: Alert and oriented. No focal neurological deficits. Extremities: s/p left transtibial amputation and shrinker with wound VAC.  Skin: No rashes, lesions or ulcers Psychiatry: Judgement and insight appear normal. Mood & affect appropriate.     Data Reviewed: I have personally reviewed following labs and imaging studies  CBC: Recent Labs  Lab 09/09/19 1523 09/11/19 0339 09/12/19 0246 09/13/19 0627 09/14/19 0315 09/15/19 0505  WBC 9.7 9.1 8.1 10.7* 8.1 8.6  NEUTROABS 8.1*  --  6.4 7.8* 6.3 7.5  HGB 11.7* 10.4* 11.0* 10.7* 10.4* 10.0*  HCT 42.1 37.0* 39.3 36.7* 36.2* 35.6*  MCV 89.0 87.7 88.3 86.2 87.9 87.3  PLT 255 220 214 218 192 161   Basic Metabolic Panel: Recent Labs  Lab 09/11/19 0339 09/12/19 0246 09/13/19 0627 09/14/19 0315 09/15/19 0505  NA 143 143 142 140 140  K 3.7 3.6 3.4* 4.0 4.6  CL 103 103 101 100 99  CO2 33* 32 31 32 28  GLUCOSE 181* 150* 26* 129* 258*  BUN 25* 32* 36* 39* 48*  CREATININE 1.18 1.31* 1.41* 1.51* 1.72*  CALCIUM 8.3* 8.3* 8.2* 8.2* 8.1*  MG  --  1.9 1.9 1.9  --    GFR: Estimated Creatinine Clearance: 41 mL/min (A) (by C-G formula  based on SCr of 1.72 mg/dL (H)). Liver Function Tests: Recent Labs  Lab 09/09/19 1523  AST 16  ALT 16  ALKPHOS 102  BILITOT 1.1  PROT 6.0*  ALBUMIN 2.7*   No results for input(s): LIPASE, AMYLASE in the last 168 hours. No results for input(s): AMMONIA in the last 168 hours. Coagulation Profile: No results for input(s): INR, PROTIME in the last 168 hours. Cardiac Enzymes: No results for input(s): CKTOTAL, CKMB, CKMBINDEX, TROPONINI in the last 168 hours. BNP (last 3 results) No results for input(s): PROBNP in the last 8760 hours. HbA1C: No results for input(s): HGBA1C in the last 72 hours. CBG: Recent Labs  Lab 09/14/19 1340 09/14/19 1545 09/14/19 2257 09/15/19 0634 09/15/19 1143  GLUCAP 114* 184* 280* 230*  322*   Lipid Profile: No results for input(s): CHOL, HDL, LDLCALC, TRIG, CHOLHDL, LDLDIRECT in the last 72 hours. Thyroid Function Tests: No results for input(s): TSH, T4TOTAL, FREET4, T3FREE, THYROIDAB in the last 72 hours. Anemia Panel: No results for input(s): VITAMINB12, FOLATE, FERRITIN, TIBC, IRON, RETICCTPCT in the last 72 hours. Sepsis Labs: No results for input(s): PROCALCITON, LATICACIDVEN in the last 168 hours.  Recent Results (from the past 240 hour(s))  Blood culture (routine x 2)     Status: None   Collection Time: 09/09/19  3:48 PM   Specimen: BLOOD  Result Value Ref Range Status   Specimen Description BLOOD RIGHT ANTECUBITAL  Final   Special Requests   Final    BOTTLES DRAWN AEROBIC AND ANAEROBIC Blood Culture adequate volume   Culture   Final    NO GROWTH 5 DAYS Performed at [facility], [address], [place], [place] [address]    Report Status 09/14/2019 FINAL  Final  Blood culture (routine x 2)     Status: None   Collection Time: 09/09/19  4:37 PM   Specimen: BLOOD  Result Value Ref Range Status   Specimen Description BLOOD LEFT ANTECUBITAL  Final   Special Requests   Final    BOTTLES DRAWN AEROBIC AND ANAEROBIC Blood Culture results may not be optimal due to an inadequate volume of blood received in culture bottles   Culture   Final    NO GROWTH 5 DAYS Performed at [facility], [place] [address] [place], [place] [address]    Report Status 09/14/2019 FINAL  Final  SARS CORONAVIRUS 2 (TAT 6-24 HRS) Nasopharyngeal Nasopharyngeal Swab     Status: None   Collection Time: 09/10/19  3:55 AM   Specimen: Nasopharyngeal Swab  Result Value Ref Range Status   SARS Coronavirus 2 NEGATIVE NEGATIVE Final    Comment: (NOTE) SARS-CoV-2 target nucleic acids are NOT DETECTED. The SARS-CoV-2 RNA is generally detectable in upper and lower respiratory specimens during the acute phase of infection. Negative results do not preclude SARS-CoV-2 infection, do not  rule out co-infections with other pathogens, and should not be used as the sole basis for treatment or other patient management decisions. Negative results must be combined with clinical observations, patient history, and epidemiological information. The expected result is Negative. Fact Sheet for Patients: SugarRoll.be Fact Sheet for Healthcare Providers: https://www.woods-mathews.com/ This test is not yet approved or cleared by the [place] FDA and  has been authorized for detection and/or diagnosis of SARS-CoV-2 by FDA under an Emergency Use Authorization (EUA). This EUA will remain  in effect (meaning this test can be used) for the duration of the COVID-19 declaration under Section 56 4(b)(1) of the Act,  21 U.S.C. section 360bbb-3(b)(1), unless the authorization is terminated or revoked sooner. Performed at [facility], [place] [address], [place], [place] [address]   MRSA PCR Screening     Status: None   Collection Time: 09/10/19  1:05 PM   Specimen: Nasal Mucosa; Nasopharyngeal  Result Value Ref Range Status   MRSA by PCR NEGATIVE NEGATIVE Final    Comment:        The GeneXpert MRSA Assay (FDA approved for NASAL specimens only), is one component of a comprehensive MRSA colonization surveillance program. It is not intended to diagnose MRSA infection nor to guide or monitor treatment for MRSA infections. Performed at [facility], [place] [address], [place], [place] [address]   Body fluid culture (includes gram stain)     Status: None   Collection Time: 09/12/19  2:54 PM   Specimen: Pleural Fluid  Result Value Ref Range Status   Specimen Description PLEURAL  Final   Special Requests Normal  Final   Gram Stain NO WBC SEEN NO ORGANISMS SEEN   Final   Culture   Final    NO GROWTH 3 DAYS Performed at [facility], [address] [place], [place] [address]    Report Status 09/15/2019 FINAL  Final          Radiology Studies: No results found.      Scheduled Meds: . amLODipine  5 mg Oral Daily  . atorvastatin  20 mg Oral QPM  . docusate sodium  100 mg Oral BID  . docusate sodium  100 mg Oral BID  . donepezil  10 mg Oral QHS  . dorzolamide  1 drop Both Eyes BID  . finasteride  5 mg Oral QPM  . gabapentin  300 mg Oral TID  . heparin injection (subcutaneous)  5,000 Units Subcutaneous Q8H  . insulin aspart  0-5 Units Subcutaneous QHS  . insulin aspart  0-9 Units Subcutaneous TID WC  . insulin glargine  5 Units Subcutaneous Daily  . metoprolol succinate  25 mg Oral Q breakfast  . multivitamin  1 tablet Oral Daily  . nutrition supplement (JUVEN)  1 packet Oral BID BM  . pantoprazole  40 mg Oral Daily  . Ensure Max Protein  11 oz Oral QHS  . tamsulosin  0.4 mg Oral Q breakfast   Continuous Infusions: . cefTRIAXone (ROCEPHIN)  IV 2 g (09/15/19 1100)  . metronidazole 500 mg (09/15/19 1130)  . vancomycin 750 mg (09/14/19 1643)     LOS: 5 days    Time spent: 35 minutes    Irine Seal, MD [facility]  If 7PM-7AM, please contact night-coverage [email or web address] 09/15/2019, 12:33 PM

## 2019-09-15 NOTE — Plan of Care (Signed)

## 2019-09-15 NOTE — Evaluation (Signed)
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 Pt was requiring assist for mobility and ADL. Pt currently limited by decreased strength, pain in LLE and RLE with ulcer, and decreased ability to care for self. Pt requiring assist for truncal support throughout as pt with poor stability at EOB; pt unable to move BLEs very well. Pt minA to [place] for ADL. Pt sitting EOB ~10 mins with intermittent assist for stability; set-upA for grooming. Pt would greatly benefit from continued OT skilled services for ADL, mobility and safety. OT following acutely.      Follow Up Recommendations  CIR    Equipment Recommendations  Other (comment)(to be determined)    Recommendations for Other Services Rehab consult     Precautions / Restrictions Precautions Precautions: Fall Restrictions Weight Bearing Restrictions: Yes LLE Weight Bearing: Non weight bearing      Mobility Bed Mobility Overal bed mobility: Needs Assistance Bed Mobility: Supine to Sit;Sit to Supine     Supine to sit: Mod assist;HOB elevated Sit to supine: Max assist;HOB elevated   General bed mobility comments: Pt requiring assist for truncal supprt throughout as pt with poor stability at EOB; pt unable to move BLEs very well.  Transfers                 General transfer comment: Pt deferred.    Balance Overall balance assessment: Needs assistance;History of  Falls Sitting-balance support: No upper extremity supported;Feet supported   Sitting balance - Comments: Pt sitting intermittently without support for brushing teeth Postural control: Posterior lean     Standing balance comment: not assessed                           ADL either performed or assessed with clinical judgement   ADL Overall ADL's : Needs assistance/impaired Eating/Feeding: Set up;Sitting   Grooming: Set up;Sitting;Bed level Grooming Details (indicate cue type and reason): Pt requiring assist to stabilize trunk as pt fatigues easily after 2-3 mins Upper Body Bathing: Minimal assistance;Sitting;Bed level   Lower Body Bathing: Maximal assistance;Total assistance;Sitting/lateral leans;Bed level   Upper Body Dressing : Minimal assistance;Sitting;Bed level   Lower Body Dressing: Maximal assistance;Total assistance;+2 for physical assistance;+2 for safety/equipment;Sitting/lateral leans;Bed level   Toilet Transfer: Total assistance;+2 for physical assistance;+2 for safety/equipment   Toileting- Clothing Manipulation and Hygiene: Maximal assistance;Total assistance;Bed level Toileting - Clothing Manipulation Details (indicate cue type and reason): rolling side to side, unable to attempt at EOB     Functional mobility during ADLs: Total assistance;+2 for physical assistance;+2 for safety/equipment;Cueing for safety;Cueing for sequencing General ADL Comments: Pt limited by decreased strength, pain in LLE, and decreased ability to care for self     Vision Baseline Vision/History: No visual deficits Patient Visual Report: No change from baseline Vision Assessment?: No apparent visual deficits     Perception     Praxis      Pertinent Vitals/Pain Pain Assessment: 0-10 Pain Score: 5  Pain Location:  L residual limb Pain Descriptors / Indicators: Sore Pain Intervention(s): Limited activity within patient's tolerance;Premedicated before session     Hand  Dominance Right   Extremity/Trunk Assessment Upper Extremity Assessment Upper Extremity Assessment: Generalized weakness   Lower Extremity Assessment Lower Extremity Assessment: Defer to PT evaluation;RLE deficits/detail;LLE deficits/detail RLE Deficits / Details: superficial ulcer to RLE in prevalon LLE Deficits / Details: s/p L BKA    Cervical / Trunk Assessment Cervical / Trunk Assessment: Normal   Communication Communication Communication: No difficulties   Cognition Arousal/Alertness: Awake/alert Behavior During Therapy: WFL for tasks assessed/performed Overall Cognitive Status: Within Functional Limits for tasks assessed                                     General Comments  R heel ulcer    Exercises     Shoulder Instructions      Home Living Family/patient expects to be discharged to:: Inpatient rehab                                        Prior Functioning/Environment Level of Independence: Needs assistance  Gait / Transfers Assistance Needed: pt reports doing squat pivot with some assist from SNF staff PTA. Pt reports he has not ambulated since late October, when he had femur fracture s/p fall. Pt reports PT/OT have not been working with [facility] at [facility]. ADL's / Homemaking Assistance Needed: Pt reports getting assist for dressing/bathing at SNF.            OT Problem List: Decreased strength;Decreased activity tolerance;Impaired balance (sitting and/or standing);Decreased safety awareness;Pain;Decreased cognition;Decreased knowledge of use of DME or AE      OT Treatment/Interventions: Self-care/ADL training;Therapeutic exercise;Neuromuscular education;Energy conservation;DME and/or AE instruction;Therapeutic activities;Cognitive remediation/compensation;Visual/perceptual remediation/compensation;Patient/family education;Balance training    OT Goals(Current goals can be found in the care plan section) Acute Rehab OT Goals Patient  Stated Goal: get stronger, get a prosthesis OT Goal Formulation: With patient Time For Goal Achievement: 09/29/19 Potential to Achieve Goals: Good ADL Goals Pt Will Perform Grooming: with set-up;sitting Pt Will Perform Upper Body Dressing: with set-up;sitting Pt Will Perform Lower Body Dressing: with mod assist;sitting/lateral leans;bed level Pt Will Transfer to Toilet: with mod assist;with transfer board;anterior/posterior transfer;squat pivot transfer Pt Will Perform Toileting - Clothing Manipulation and hygiene: with mod assist;sitting/lateral leans Pt/caregiver will Perform Home Exercise Program: Increased strength;Both right and left upper extremity  OT Frequency: Min 2X/week   Barriers to D/C:            Co-evaluation              AM-PAC OT "6 Clicks" Daily Activity     Outcome Measure Help from another person eating meals?: None Help from another person taking care of personal grooming?: A Little Help from another person toileting, which includes using toliet, bedpan, or urinal?: Total Help from another person bathing (including washing, rinsing, drying)?: A Lot Help from another person to put on and taking off regular upper body clothing?: A Little Help from another person to put on and taking off regular lower body clothing?: Total 6 Click Score: 14   End of Session Nurse Communication: Mobility status  Activity Tolerance: Patient limited by pain;Patient limited by fatigue Patient left: in bed;with call bell/phone within reach;with bed alarm set  OT Visit Diagnosis: Unsteadiness on feet (  R26.81);Muscle weakness (generalized) (M62.81);Pain Pain - Right/Left: Left Pain - part of body: Leg                Time: 2500-3704 OT Time Calculation (min): 32 min Charges:  OT General Charges $OT Visit: 1 Visit OT Evaluation $OT Eval Moderate Complexity: 1 Mod OT Treatments $Self Care/Home Management : 8-22 mins  Jefferey Pica OTR/L Acute Rehabilitation Services Pager:  [phone] Office: [phone]   [place] C 09/15/2019, 1:18 PM

## 2019-09-15 NOTE — Progress Notes (Signed)
POD 1 BKA . Doing well pain controlled. VAC in place 0cc .   PT OT today. Disposition to SNF

## 2019-09-16 LAB — BASIC METABOLIC PANEL
Anion gap: 12 (ref 5–15)
BUN: 58 mg/dL — ABNORMAL HIGH (ref 8–23)
CO2: 28 mmol/L (ref 22–32)
Calcium: 8.3 mg/dL — ABNORMAL LOW (ref 8.9–10.3)
Chloride: 98 mmol/L (ref 98–111)
Creatinine, Ser: 2.26 mg/dL — ABNORMAL HIGH (ref 0.61–1.24)
GFR calc Af Amer: 31 mL/min — ABNORMAL LOW (ref >60)
GFR calc non Af Amer: 27 mL/min — ABNORMAL LOW (ref >60)
Glucose, Bld: 251 mg/dL — ABNORMAL HIGH (ref 70–99)
Potassium: 4.7 mmol/L (ref 3.5–5.1)
Sodium: 138 mmol/L (ref 135–145)

## 2019-09-16 LAB — CBC
HCT: 38.5 % — ABNORMAL LOW (ref 39.0–52.0)
Hemoglobin: 10.4 g/dL — ABNORMAL LOW (ref 13.0–17.0)
MCH: 24.1 pg — ABNORMAL LOW (ref 26.0–34.0)
MCHC: 27 g/dL — ABNORMAL LOW (ref 30.0–36.0)
MCV: 89.1 fL (ref 80.0–100.0)
Platelets: 190 10*3/uL (ref 150–400)
RBC: 4.32 MIL/uL (ref 4.22–5.81)
RDW: 17.6 % — ABNORMAL HIGH (ref 11.5–15.5)
WBC: 7.9 10*3/uL (ref 4.0–10.5)
nRBC: 0 % (ref 0.0–0.2)

## 2019-09-16 LAB — GLUCOSE, CAPILLARY
Glucose-Capillary: 225 mg/dL — ABNORMAL HIGH (ref 70–99)
Glucose-Capillary: 240 mg/dL — ABNORMAL HIGH (ref 70–99)
Glucose-Capillary: 265 mg/dL — ABNORMAL HIGH (ref 70–99)
Glucose-Capillary: 305 mg/dL — ABNORMAL HIGH (ref 70–99)

## 2019-09-16 MED ORDER — INSULIN GLARGINE 100 UNIT/ML ~~LOC~~ SOLN
8.0000 [IU] | Freq: Every day | SUBCUTANEOUS | Status: DC
  Administered 2019-09-16 – 2019-09-17 (×2): 8 [IU] via SUBCUTANEOUS
  Filled 2019-09-16 (×3): qty 0.08

## 2019-09-16 MED ORDER — SODIUM CHLORIDE 0.9 % IV SOLN: INTRAVENOUS | Status: DC

## 2019-09-16 MED ORDER — SODIUM CHLORIDE 0.9% FLUSH
10.0000 mL | Freq: Two times a day (BID) | INTRAVENOUS | Status: DC
  Administered 2019-09-16 – 2019-09-22 (×5): 10 mL

## 2019-09-16 MED ORDER — SODIUM CHLORIDE 0.9% FLUSH: 10.0000 mL | INTRAVENOUS | Status: DC | PRN

## 2019-09-16 NOTE — Clinical Social Work Note (Signed)
Most recent PT note recommending CIR, but stating that patient may be more appropriate for SNF.   CSW has started auth for SNF in the event patient is more appropriate. CIR will re evaluate on Monday or Tuesday.   CSW will continue to follow for dispo needs.

## 2019-09-16 NOTE — Telephone Encounter (Signed)
Tried to call no answer and no vm. Will hold and try again later.

## 2019-09-16 NOTE — Progress Notes (Signed)
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 Sat x 20 minutes at EOB and participated in ADL. Unable to clear buttocks with attempt to stand from elevated bed. Will need lift equipment for OOB. Pt with Sp02 ot 88% on 2L, bumped up to 4L at RN's request at end of session. Pt with dizziness and BP of 91/56 in sitting. Pt works hard and is very pleasant. Demonstrating difficulty with recognizing ADL items, memory and following commands. Updated d/c disposition to SNF.  Follow Up Recommendations  SNF    Equipment Recommendations  Other (comment)(defer to next venue)    Recommendations for Other Services      Precautions / Restrictions Precautions Precautions: Fall Precaution Comments: monitor BP, 02 Restrictions Weight Bearing Restrictions: Yes LLE Weight Bearing: Non weight bearing       Mobility Bed Mobility Overal bed mobility: Needs Assistance Bed Mobility: Supine to Sit;Sit to Supine;Rolling Rolling: Max assist   Supine to sit: +2 for physical assistance;Max assist;HOB elevated Sit to supine: HOB elevated;Mod assist   General bed mobility comments: verbal cues for sequencing and to self assist using rail, assist for LEs over EOB, to raise trunk and position hips at EOB, assisted LEs back into bed, rolled for pericare and placement of bed pad  Transfers                 General transfer comment: unable to clear buttocks  with attempt to stand from elevated bed    Balance Overall balance assessment: Needs assistance;History of Falls Sitting-balance support: Single extremity supported;Feet supported Sitting balance-Leahy Scale: Poor Sitting balance - Comments: max to min guard assist Postural control: Posterior lean                                 ADL either performed or assessed with clinical judgement   ADL Overall ADL's : Needs assistance/impaired     Grooming: Oral care;Minimal assistance;Sitting Grooming Details (indicate cue type and reason): assist for sitting balance and cues to use dominant hand, pt initially thinking the toothbrush was a writing implement         Upper Body Dressing : Moderate assistance;Bed level   Lower Body Dressing: Total assistance;Bed level                       Vision       Perception     Praxis      Cognition Arousal/Alertness: Awake/alert Behavior During Therapy: WFL for tasks assessed/performed Overall Cognitive Status: History of cognitive impairments - at baseline                                 General Comments: pt pleasant and cooperative, poor memory and slow to follow commands        Exercises     Shoulder Instructions  General Comments      Pertinent Vitals/ Pain       Pain Assessment: Faces Faces Pain Scale: No hurt  Home Living                                          Prior Functioning/Environment              Frequency  Min 2X/week        Progress Toward Goals  OT Goals(current goals can now be found in the care plan section)  Progress towards OT goals: Progressing toward goals  Acute Rehab OT Goals Patient Stated Goal: get stronger, get a prosthesis OT Goal Formulation: With patient Time For Goal Achievement: 09/29/19 Potential to Achieve Goals: Good  Plan Discharge plan remains appropriate    Co-evaluation    PT/OT/SLP  Co-Evaluation/Treatment: Yes            AM-PAC OT "6 Clicks" Daily Activity     Outcome Measure   Help from another person eating meals?: None Help from another person taking care of personal grooming?: A Little Help from another person toileting, which includes using toliet, bedpan, or urinal?: Total Help from another person bathing (including washing, rinsing, drying)?: A Lot Help from another person to put on and taking off regular upper body clothing?: A Lot Help from another person to put on and taking off regular lower body clothing?: Total 6 Click Score: 13    End of Session Equipment Utilized During Treatment: Gait belt  OT Visit Diagnosis: Unsteadiness on feet (R26.81);Muscle weakness (generalized) (M62.81);Pain   Activity Tolerance Patient tolerated treatment well   Patient Left in bed;with call bell/phone within reach;with bed alarm set   Nurse Communication Other (comment)(Sp02 of 88 on 2L, low BP, bumped 02 up to 4l)        Time: 3354-5625 OT Time Calculation (min): 34 min  Charges: OT General Charges $OT Visit: 1 Visit OT Treatments $Self Care/Home Management : 8-22 mins  Nestor Lewandowsky, OTR/L Acute Rehabilitation Services Pager: [phone] Office: [phone]   Malka So 09/16/2019, 9:56 AM

## 2019-09-16 NOTE — Progress Notes (Signed)
Doing well. [facility] in [place]. Alert . From and orthopedic standpoint may DC to SNF with ortho follow up in office next week

## 2019-09-16 NOTE — Progress Notes (Signed)
Patient alert and talkative.  Both arms appear edematous.  Left arm remains elevated on a pillow.

## 2019-09-16 NOTE — Progress Notes (Signed)
The patient's family has requesting the the nursing staff to stop giving Gabapentin.  Dr Irine Seal has been notified regarding the family's request.

## 2019-09-16 NOTE — Progress Notes (Signed)
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 He required two person heavy physical assistance for bed mobility and was unable to clear buttocks from bed during attempted stand with max A x2. He was able to tolerate sitting at EOB for ~20 minutes with min-max A. Pt reporting some dizziness with sitting as well. BP was assessed (91/56 mmHg) and SPO2 maintaining at 88% on 2L of O2. Pt's RN was notified and instructed therapist to increase him to 4L and she would reassess him soon. Updated recommendations to SNF as he seems more appropriate for that level of care based on his current functional mobility status and tolerance to therapeutic interventions.  Pt would continue to benefit from skilled physical therapy services at this time while admitted and after d/c to address the below listed limitations in order to improve overall safety and independence with functional mobility.   Follow Up Recommendations  SNF     Equipment Recommendations  None recommended by PT    Recommendations for Other Services       Precautions / Restrictions Precautions Precautions: Fall Precaution Comments: monitor SPO2 Restrictions Weight Bearing Restrictions: Yes LLE Weight Bearing: Non weight bearing    Mobility  Bed Mobility Overal bed mobility: Needs Assistance Bed Mobility: Supine to Sit;Sit to Supine;Rolling Rolling: Max assist    Supine to sit: +2 for physical assistance;Max assist;HOB elevated Sit to supine: HOB elevated;Mod assist   General bed mobility comments: verbal cues for sequencing and to self assist using rail, assist for LEs over EOB, to raise trunk and position hips at EOB, assisted LEs back into bed, rolled for pericare and placement of bed pad  Transfers                 General transfer comment: unable to clear buttocks with attempt to stand from elevated bed  Ambulation/Gait                 Stairs             Wheelchair Mobility    Modified Rankin (Stroke Patients Only)       Balance Overall balance assessment: Needs assistance;History of Falls Sitting-balance support: Single extremity supported;Feet supported Sitting balance-Leahy Scale: Poor Sitting balance - Comments: min-max A needed to sit EOB Postural control: Posterior lean                                  Cognition Arousal/Alertness: Awake/alert Behavior During Therapy: WFL for tasks assessed/performed Overall Cognitive Status: Impaired/Different from baseline Area of Impairment: Memory;Following commands;Problem solving                     Memory: Decreased recall of precautions;Decreased short-term memory Following Commands: Follows one step commands with increased time     Problem Solving: Slow processing;Difficulty sequencing;Requires verbal cues        Exercises Amputee Exercises Knee Flexion:  AROM;Strengthening;Left;10 reps;Seated Knee Extension: AROM;Strengthening;Left;10 reps;Seated    General Comments        Pertinent Vitals/Pain Pain Assessment: No/denies pain    Home Living                      Prior Function            PT Goals (current goals can now be found in the care plan section) Acute Rehab PT Goals PT Goal Formulation: With patient/family Time For Goal Achievement: 09/28/19 Potential to Achieve Goals: Fair Progress towards PT  goals: Progressing toward goals    Frequency    Min 2X/week      PT Plan Discharge plan needs to be updated;Frequency needs to be updated    Co-evaluation PT/OT/SLP Co-Evaluation/Treatment: Yes Reason for Co-Treatment: For patient/therapist safety;To address functional/ADL transfers PT goals addressed during session: Mobility/safety with mobility;Balance;Proper use of DME;Strengthening/ROM        AM-PAC PT "6 Clicks" Mobility   Outcome Measure  Help needed turning from your back to your side while in a flat bed without using bedrails?: A Lot Help needed moving from lying on your back to sitting on the side of a flat bed without using bedrails?: A Lot Help needed moving to and from a bed to a chair (including a wheelchair)?: Total Help needed standing up from a chair using your arms (e.g., wheelchair or bedside chair)?: Total Help needed to walk in hospital room?: Total Help needed climbing 3-5 steps with a railing? : Total 6 Click Score: 8    End of Session Equipment Utilized During Treatment: Gait belt Activity Tolerance: Patient limited by fatigue Patient left: in bed;with call bell/phone within reach;with bed alarm set Nurse Communication: Mobility status;Other (comment)(SPO2 maintaining 88-89% on 2L) PT Visit Diagnosis: Unsteadiness on feet (R26.81);Other abnormalities of gait and mobility (R26.89)     Time: 7371-0626 PT Time Calculation (min) (ACUTE ONLY): 34 min  Charges:  $Therapeutic Activity: 8-22 mins                     Anastasio Champion, DPT  Acute Rehabilitation Services Pager [phone] Office [place] 09/16/2019, 2:51 PM

## 2019-09-16 NOTE — Clinical Social Work Note (Signed)
Attention [employer]  Please follow up with Darrell Mcclain with authorization details.

## 2019-09-16 NOTE — Progress Notes (Addendum)
Inpatient Rehab Admissions Coordinator:   Spoke with pt's daughter, [name], regarding pt's PLOF.  Pt was a resident at [facility], and was w/c level +2 assist following recent hip fx and surgery in October 2020.  Note therapy recommendations changed to SNF level rehab, and this may be more appropriate; however, will follow for one more session to determine if pt progresses enough to be able to tolerate CIR program. Will check back in on Monday/Tuesday.   Shann Medal, PT, DPT Admissions Coordinator [phone] 09/16/19  3:43 PM

## 2019-09-16 NOTE — Progress Notes (Signed)
Left arm is edematous and has redness from previous IV infiltration.  Left arm elevated on pillow.

## 2019-09-16 NOTE — Progress Notes (Signed)
PROGRESS NOTE    Darrell Mcclain  BUL:[id number] DOB: 07-05-1941 DOA: 09/09/2019 PCP: Burnard Bunting, MD    Brief Narrative:  79 year old male with history of diabetes mellitus type II, prostate cancer, dementia, hypertension, hyperlipidemia, unspecified CVA, BPH with history of chronic indwelling Foley (not currently present), chronic diastolic CHF, chronic kidney disease stage III, chronic respiratory failure on home oxygen, atrial flutter on Xarelto presented with foot infection.  MRI of the left foot showed osteomyelitis.  He was started on broad-spectrum antibiotics.  Orthopedics was consulted.    Assessment & Plan:   Principal Problem:   Osteomyelitis ([place]) Active Problems:   Atrial flutter (HCC)   Chronic diastolic CHF (congestive heart failure), NYHA class 2 (HCC)   Memory loss   Uncontrolled diabetes mellitus ([place])   Morbid obesity (HCC)   Hyperlipidemia   Essential hypertension   Malignant neoplasm of prostate (HCC)   Stage 3a chronic kidney disease   Diabetic foot infection ([place])   Moderate protein-calorie malnutrition ([place])   Goals of care, counseling/discussion   Palliative care encounter   Pleural effusion   Chronic respiratory failure with hypoxia (HCC)   Vascular dementia without behavioral disturbance (HCC)   Hypokalemia  1 gangrenous left heel ulcer with acute osteomyelitis MRI of the left heel done consistent with left heel osteomyelitis.  ABIs were done.  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 [name].  Continue broad-spectrum antibiotics for another 24 hours and discontinue tomorrow.  IV vancomycin has been discontinued. Continue pain management.  Patient with wound VAC on and management per orthopedics.  2.  Chronic hypoxic respiratory failure on 2 L home O2/loculated chronic right sided pleural effusion CT chest which was done showed increased size of left pleural effusion, moderate now, persistent  stable right pleural effusion and suspected atelectasis in the right lung base also with stable mediastinal lymphadenopathy.  Chest x-ray done on admission showed slight increase in right loculated moderate pleural effusion.  Pulmonary was consulted and patient subsequently underwent ultrasound-guided thoracentesis of the right with fluid analysis consistent with exudative, lymphocytic predominant with 1100 nucleated cells with lymphocytosis but no malignancy identified.  Per pulmonary likely related to chronic medical conditions and suspected will reaccumulate like he did in the past.  Per pulmonary no further recommendations at this time.  Follow.  3.  Chronic diastolic heart failure/paroxysmal atrial flutter 2D echo from 07/01/2019 with a EF of 50 to 55% with moderate TR and severely elevated pulmonary artery pressure.  Patient with a urine output of 900 cc over the past 24 hours.  Patient with sats of 99% on 2 L nasal cannula.  Blood pressure borderline.  Lasix and lisinopril have been held.  Will discontinue Norvasc and Toprol-XL for now.  Continue Lipitor. Xarelto held in anticipation of surgical intervention.  Orthopedics to advise when Xarelto may be resumed.  Follow.  4.  Moderate protein calorie malnutrition  5.  Diabetes mellitus type 2 Hemoglobin A1c 6.8.  Lantus was discontinued as patient noted to have a hypoglycemic episode the morning of 09/13/2019.  CBG of 225 this morning.  Increase Lantus to 80 units daily.  Sliding scale insulin.   6.  Acute on chronic kidney disease stage III Creatinine trending up.  Patient noted to have borderline blood pressure.  Will place on gentle hydration for the next 24 hours.  Discontinue antihypertensive medications at this time.  Follow.   7.  Hypokalemia Repleted.  8.  Anemia of chronic disease H&H stable  at 10.0.  Follow.  9.  Hyperlipidemia Continue statin.  10.  Hypertension Blood pressure borderline.  Will discontinue Norvasc and Toprol-XL.   Lasix and lisinopril have been discontinued.  Gentle hydration.  Follow.    11.  History of prostate cancer Status post treatment in 2018  12.  Vascular dementia Continue Aricept.  Fall precautions.  Palliative care consulted and are following.    DVT prophylaxis: Heparin Code Status: DNR Family Communication: Updated patient.  No family at bedside.  Disposition Plan: CIR versus SNF   Consultants:   Orthopedics: Dr.[name] 09/10/2019  Orthopedics: Dr. Lyla Glassing 09/10/2019  Wound care  Rib/PCCM: Dr. Valeta Harms 09/12/2019  Palliative care: Vinie Sill, NP/Dr. Rowe Pavy 09/13/2019  Inpatient rehab  Procedures:   Transtibial amputation left lower extremity per Dr. [name] 09/14/2019  CT chest 09/12/2019  Chest x-ray 09/10/2019, 09/12/2019  Plain films of the orbits 09/10/2019  Plain films of the left foot 09/09/2019  MRI right heel 09/10/2019  MRI left heel 09/10/2019  ABIs with/without TBI 09/11/2019  Ultrasound-guided thoracentesis per Dr. Tamala Julian, pulmonary 09/12/2019  Antimicrobials:   IV vancomycin 09/10/2019>>>> 09/15/2019  IV Flagyl 09/10/2019>>>>>>  IV Rocephin 09/10/2019>>>>>  IV Zosyn x1 dose 09/10/2019   Subjective: Patient sleeping.  Arousable.  Denies any chest pain or shortness of breath.  States he is tired.  Per RN patient with some altered mental status while on Neurontin.    Objective: Vitals:   09/16/19 0359 09/16/19 0500 09/16/19 0741 09/16/19 0840  BP: (!) 111/58  (!) 111/58 98/88  Pulse: (!) 42  (!) 42 90  Resp: 16     Temp: 98.8 F (37.1 C)   (!) 97.4 F (36.3 C)  TempSrc:    Oral  SpO2: 93%   93%  Weight:  100.6 kg    Height:        Intake/Output Summary (Last 24 hours) at 09/16/2019 1147 Last data filed at 09/15/2019 1900 Gross per 24 hour  Intake --  Output 500 ml  Net -500 ml   Filed Weights   09/10/19 0944 09/13/19 0500 09/16/19 0500  Weight: 95 kg 99.3 kg 100.6 kg    Examination:  General exam: NAD. Respiratory system: Clear to auscultation bilaterally  anterior lung fields.  No wheezes, no crackles, no rhonchi.  Normal respiratory effort.  Cardiovascular system: RRR no murmurs rubs or gallops.  No JVD.  No lower extremity edema.   Gastrointestinal system: Abdomen is nontender, nondistended, soft, positive bowel sounds.  No rebound.  No guarding.  Central nervous system: Alert and oriented. No focal neurological deficits. Extremities: s/p left transtibial amputation and shrinker with wound VAC.  Skin: No rashes, lesions or ulcers Psychiatry: Judgement and insight appear normal. Mood & affect appropriate.     Data Reviewed: I have personally reviewed following labs and imaging studies  CBC: Recent Labs  Lab 09/09/19 1523 09/12/19 0246 09/13/19 0627 09/14/19 0315 09/15/19 0505 09/16/19 0124  WBC 9.7 8.1 10.7* 8.1 8.6 7.9  NEUTROABS 8.1* 6.4 7.8* 6.3 7.5  --   HGB 11.7* 11.0* 10.7* 10.4* 10.0* 10.4*  HCT 42.1 39.3 36.7* 36.2* 35.6* 38.5*  MCV 89.0 88.3 86.2 87.9 87.3 89.1  PLT 255 214 218 192 186 017   Basic Metabolic Panel: Recent Labs  Lab 09/12/19 0246 09/13/19 0627 09/14/19 0315 09/15/19 0505 09/16/19 0124  NA 143 142 140 140 138  K 3.6 3.4* 4.0 4.6 4.7  CL 103 101 100 99 98  CO2 32 31 32 28 28  GLUCOSE 150*  26* 129* 258* 251*  BUN 32* 36* 39* 48* 58*  CREATININE 1.31* 1.41* 1.51* 1.72* 2.26*  CALCIUM 8.3* 8.2* 8.2* 8.1* 8.3*  MG 1.9 1.9 1.9  --   --    GFR: Estimated Creatinine Clearance: 31.4 mL/min (A) (by C-G formula based on SCr of 2.26 mg/dL (H)). Liver Function Tests: Recent Labs  Lab 09/09/19 1523  AST 16  ALT 16  ALKPHOS 102  BILITOT 1.1  PROT 6.0*  ALBUMIN 2.7*   No results for input(s): LIPASE, AMYLASE in the last 168 hours. No results for input(s): AMMONIA in the last 168 hours. Coagulation Profile: No results for input(s): INR, PROTIME in the last 168 hours. Cardiac Enzymes: No results for input(s): CKTOTAL, CKMB, CKMBINDEX, TROPONINI in the last 168 hours. BNP (last 3 results) No  results for input(s): PROBNP in the last 8760 hours. HbA1C: No results for input(s): HGBA1C in the last 72 hours. CBG: Recent Labs  Lab 09/15/19 1143 09/15/19 1626 09/15/19 2004 09/16/19 0650 09/16/19 1132  GLUCAP 322* 266* 235* 225* 240*   Lipid Profile: No results for input(s): CHOL, HDL, LDLCALC, TRIG, CHOLHDL, LDLDIRECT in the last 72 hours. Thyroid Function Tests: No results for input(s): TSH, T4TOTAL, FREET4, T3FREE, THYROIDAB in the last 72 hours. Anemia Panel: No results for input(s): VITAMINB12, FOLATE, FERRITIN, TIBC, IRON, RETICCTPCT in the last 72 hours. Sepsis Labs: No results for input(s): PROCALCITON, LATICACIDVEN in the last 168 hours.  Recent Results (from the past 240 hour(s))  Blood culture (routine x 2)     Status: None   Collection Time: 09/09/19  3:48 PM   Specimen: BLOOD  Result Value Ref Range Status   Specimen Description BLOOD RIGHT ANTECUBITAL  Final   Special Requests   Final    BOTTLES DRAWN AEROBIC AND ANAEROBIC Blood Culture adequate volume   Culture   Final    NO GROWTH 5 DAYS Performed at [facility], [address] [place], [place] [address]    Report Status 09/14/2019 FINAL  Final  Blood culture (routine x 2)     Status: None   Collection Time: 09/09/19  4:37 PM   Specimen: BLOOD  Result Value Ref Range Status   Specimen Description BLOOD LEFT ANTECUBITAL  Final   Special Requests   Final    BOTTLES DRAWN AEROBIC AND ANAEROBIC Blood Culture results may not be optimal due to an inadequate volume of blood received in culture bottles   Culture   Final    NO GROWTH 5 DAYS Performed at [facility], [place] [address], [place], [place] [address]    Report Status 09/14/2019 FINAL  Final  SARS CORONAVIRUS 2 (TAT 6-24 HRS) Nasopharyngeal Nasopharyngeal Swab     Status: None   Collection Time: 09/10/19  3:55 AM   Specimen: Nasopharyngeal Swab  Result Value Ref Range Status   SARS Coronavirus 2 NEGATIVE NEGATIVE Final    Comment:  (NOTE) SARS-CoV-2 target nucleic acids are NOT DETECTED. The SARS-CoV-2 RNA is generally detectable in upper and lower respiratory specimens during the acute phase of infection. Negative results do not preclude SARS-CoV-2 infection, do not rule out co-infections with other pathogens, and should not be used as the sole basis for treatment or other patient management decisions. Negative results must be combined with clinical observations, patient history, and epidemiological information. The expected result is Negative. Fact Sheet for Patients: SugarRoll.be Fact Sheet for Healthcare Providers: https://www.woods-mathews.com/ This test is not yet approved or cleared by the [place]  States FDA and  has been authorized for detection and/or diagnosis of SARS-CoV-2 by FDA under an Emergency Use Authorization (EUA). This EUA will remain  in effect (meaning this test can be used) for the duration of the COVID-19 declaration under Section 56 4(b)(1) of the Act, 21 U.S.C. section 360bbb-3(b)(1), unless the authorization is terminated or revoked sooner. Performed at [facility], [place] [address] [place], [place] [address]   MRSA PCR Screening     Status: None   Collection Time: 09/10/19  1:05 PM   Specimen: Nasal Mucosa; Nasopharyngeal  Result Value Ref Range Status   MRSA by PCR NEGATIVE NEGATIVE Final    Comment:        The GeneXpert MRSA Assay (FDA approved for NASAL specimens only), is one component of a comprehensive MRSA colonization surveillance program. It is not intended to diagnose MRSA infection nor to guide or monitor treatment for MRSA infections. Performed at [facility], [place] [address], [place], [place] [address]   Body fluid culture (includes gram stain)     Status: None   Collection Time: 09/12/19  2:54 PM   Specimen: Pleural Fluid  Result Value Ref Range Status   Specimen Description PLEURAL  Final   Special  Requests Normal  Final   Gram Stain NO WBC SEEN NO ORGANISMS SEEN   Final   Culture   Final    NO GROWTH 3 DAYS Performed at [facility], [address], [place], [place] [address]    Report Status 09/15/2019 FINAL  Final         Radiology Studies: No results found.      Scheduled Meds: . atorvastatin  20 mg Oral QPM  . docusate sodium  100 mg Oral BID  . donepezil  10 mg Oral QHS  . dorzolamide  1 drop Both Eyes BID  . finasteride  5 mg Oral QPM  . gabapentin  300 mg Oral TID  . heparin injection (subcutaneous)  5,000 Units Subcutaneous Q8H  . insulin aspart  0-5 Units Subcutaneous QHS  . insulin aspart  0-9 Units Subcutaneous TID WC  . insulin glargine  8 Units Subcutaneous Daily  . multivitamin  1 tablet Oral Daily  . nutrition supplement (JUVEN)  1 packet Oral BID BM  . pantoprazole  40 mg Oral Daily  . Ensure Max Protein  11 oz Oral QHS  . sodium chloride flush  10-40 mL Intracatheter Q12H  . tamsulosin  0.4 mg Oral Q breakfast   Continuous Infusions: . sodium chloride 75 mL/hr at 09/16/19 1109     LOS: 6 days    Time spent: 35 minutes    Irine Seal, MD [facility]  If 7PM-7AM, please contact night-coverage [email or web address] 09/16/2019, 11:47 AM

## 2019-09-17 ENCOUNTER — Inpatient Hospital Stay (HOSPITAL_COMMUNITY): Payer: Medicare Other

## 2019-09-17 DIAGNOSIS — G9341 Metabolic encephalopathy: Secondary | ICD-10-CM

## 2019-09-17 DIAGNOSIS — N179 Acute kidney failure, unspecified: Secondary | ICD-10-CM

## 2019-09-17 LAB — BLOOD GAS, ARTERIAL
Acid-Base Excess: 3.5 mmol/L — ABNORMAL HIGH (ref 0.0–2.0)
Bicarbonate: 29.2 mmol/L — ABNORMAL HIGH (ref 20.0–28.0)
Drawn by: 54887
FIO2: 32
O2 Saturation: 90.8 %
Patient temperature: 38
pCO2 arterial: 62.6 mmHg — ABNORMAL HIGH (ref 32.0–48.0)
pH, Arterial: 7.298 — ABNORMAL LOW (ref 7.350–7.450)
pO2, Arterial: 67.9 mmHg — ABNORMAL LOW (ref 83.0–108.0)

## 2019-09-17 LAB — URINALYSIS, ROUTINE W REFLEX MICROSCOPIC
Bilirubin Urine: NEGATIVE
Glucose, UA: NEGATIVE mg/dL
Hgb urine dipstick: NEGATIVE
Ketones, ur: NEGATIVE mg/dL
Nitrite: NEGATIVE
Protein, ur: NEGATIVE mg/dL
Specific Gravity, Urine: 1.015 (ref 1.005–1.030)
pH: 5 (ref 5.0–8.0)

## 2019-09-17 LAB — BASIC METABOLIC PANEL
Anion gap: 7 (ref 5–15)
BUN: 75 mg/dL — ABNORMAL HIGH (ref 8–23)
CO2: 29 mmol/L (ref 22–32)
Calcium: 8.1 mg/dL — ABNORMAL LOW (ref 8.9–10.3)
Chloride: 99 mmol/L (ref 98–111)
Creatinine, Ser: 2.67 mg/dL — ABNORMAL HIGH (ref 0.61–1.24)
GFR calc Af Amer: 26 mL/min — ABNORMAL LOW (ref >60)
GFR calc non Af Amer: 22 mL/min — ABNORMAL LOW (ref >60)
Glucose, Bld: 252 mg/dL — ABNORMAL HIGH (ref 70–99)
Potassium: 5.1 mmol/L (ref 3.5–5.1)
Sodium: 135 mmol/L (ref 135–145)

## 2019-09-17 LAB — GLUCOSE, CAPILLARY
Glucose-Capillary: 230 mg/dL — ABNORMAL HIGH (ref 70–99)
Glucose-Capillary: 262 mg/dL — ABNORMAL HIGH (ref 70–99)
Glucose-Capillary: 258 mg/dL — ABNORMAL HIGH (ref 70–99)
Glucose-Capillary: 252 mg/dL — ABNORMAL HIGH (ref 70–99)

## 2019-09-17 LAB — CREATININE, URINE, RANDOM: Creatinine, Urine: 158.93 mg/dL

## 2019-09-17 LAB — TSH: TSH: 1.289 u[IU]/mL (ref 0.350–4.500)

## 2019-09-17 LAB — SODIUM, URINE, RANDOM: Sodium, Ur: <10 mmol/L

## 2019-09-17 LAB — AMMONIA: Ammonia: 37 umol/L — ABNORMAL HIGH (ref 9–35)

## 2019-09-17 LAB — VITAMIN B12: Vitamin B-12: 445 pg/mL (ref 180–914)

## 2019-09-17 MED ORDER — SODIUM CHLORIDE 0.9 % IV SOLN
1.0000 g | INTRAVENOUS | Status: AC
  Administered 2019-09-17 – 2019-09-21 (×5): 1 g via INTRAVENOUS
  Filled 2019-09-17 (×2): qty 1
  Filled 2019-09-17: qty 10
  Filled 2019-09-17 (×2): qty 1

## 2019-09-17 MED ORDER — IPRATROPIUM BROMIDE 0.02 % IN SOLN
0.5000 mg | Freq: Two times a day (BID) | RESPIRATORY_TRACT | Status: DC
  Administered 2019-09-18 – 2019-09-20 (×5): 0.5 mg via RESPIRATORY_TRACT
  Filled 2019-09-17 (×5): qty 2.5

## 2019-09-17 MED ORDER — METHYLPREDNISOLONE SODIUM SUCC 125 MG IJ SOLR
80.0000 mg | Freq: Four times a day (QID) | INTRAMUSCULAR | Status: DC
  Administered 2019-09-17 – 2019-09-19 (×7): 80 mg via INTRAVENOUS
  Filled 2019-09-17 (×7): qty 2

## 2019-09-17 MED ORDER — LORATADINE 10 MG PO TABS
10.0000 mg | ORAL_TABLET | Freq: Every day | ORAL | Status: DC
  Administered 2019-09-18 – 2019-09-22 (×5): 10 mg via ORAL
  Filled 2019-09-17 (×5): qty 1

## 2019-09-17 MED ORDER — SODIUM CHLORIDE 0.9 % IV SOLN: INTRAVENOUS | Status: DC

## 2019-09-17 MED ORDER — LEVALBUTEROL HCL 0.63 MG/3ML IN NEBU
0.6300 mg | INHALATION_SOLUTION | Freq: Two times a day (BID) | RESPIRATORY_TRACT | Status: DC
  Administered 2019-09-18 – 2019-09-20 (×5): 0.63 mg via RESPIRATORY_TRACT
  Filled 2019-09-17 (×5): qty 3

## 2019-09-17 MED ORDER — IPRATROPIUM BROMIDE 0.02 % IN SOLN
0.5000 mg | Freq: Four times a day (QID) | RESPIRATORY_TRACT | Status: DC
  Administered 2019-09-17: 0.5 mg via RESPIRATORY_TRACT
  Filled 2019-09-17: qty 2.5

## 2019-09-17 MED ORDER — BUDESONIDE 0.5 MG/2ML IN SUSP
0.5000 mg | Freq: Two times a day (BID) | RESPIRATORY_TRACT | Status: DC
  Administered 2019-09-17 – 2019-09-22 (×10): 0.5 mg via RESPIRATORY_TRACT
  Filled 2019-09-17 (×10): qty 2

## 2019-09-17 MED ORDER — FLUTICASONE PROPIONATE 50 MCG/ACT NA SUSP
2.0000 | Freq: Every day | NASAL | Status: DC
  Administered 2019-09-18 – 2019-09-22 (×5): 2 via NASAL
  Filled 2019-09-17 (×2): qty 16

## 2019-09-17 MED ORDER — LEVALBUTEROL HCL 0.63 MG/3ML IN NEBU
0.6300 mg | INHALATION_SOLUTION | Freq: Four times a day (QID) | RESPIRATORY_TRACT | Status: DC
  Administered 2019-09-17: 0.63 mg via RESPIRATORY_TRACT
  Filled 2019-09-17: qty 3

## 2019-09-17 NOTE — Progress Notes (Signed)
Patient is somnolent state, remains drowsy. Responds to voice but drifts off to sleep. A&O to self. Urinalysis collected, unable to collect culture due to decreased output. Pt ate 25% of breakfast and lunch unable to complete meals due to drowsiness. Daughter was at pt's bedside, and aware of status. RN updated [name] on patients status. Dr Grandville Silos paged and made aware of decrease in status. RN called CT concerning order for head CT, they will get to pt asap, they have been busy with code stroke's.

## 2019-09-17 NOTE — Consult Note (Signed)
[facility]  INPATIENT CONSULTATION  Reason for Consultation: AKI on CKD Requesting Provider: Dr. Grandville Silos  HPI: Darrell Mcclain is an 79 y.o. male with DM, prostate Ca, dementia, HTN, HL, BPH, chronic HFpEF, A flutter who is seen for eval and management of AKI on CKD 3 in setting of foot osteomyelitis.    On admission Cr 1.2 and has trended up steadily to 2.67 today.  Appears though prior baseline Cr was more 1.7-1.9 historically back to 2015.   Pt [facility]/13/[facility] from [facility] with infection in foot > MRI with L heel osteomyelitis.  He was placed on CTX/Vanc/Flagyl.  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  Initially HTN but in past few days has been having modest hypotension into 90/50s.  No contrast, no NSAIDs. Off antibiotics after amputation.  Net I/Os documented for hospitalization -1.1L.  UOP 800 yesterday, 400 to date today. UA today + LE, no protein or blood dipstick; microscopy with 21-50WBC, 6-10 RBC, no casts noted. Urine sodium this AM <10, FeNa 0.1%. Currently on NS 100/hr as of this AM.   When I evaluated him he was drowsy and not able to provide any history.    PMH: Past Medical History:  Diagnosis Date  . Anticoagulant long-term use    xarelto  . Arthritis    right hand  . At risk for sleep apnea    STOP-BANG==  6      PT ALREADY SET UP TO HAVE STUDY AT [facility] , PCP AWARE  . Atrial flutter, chronic [facility]) dx 01/ 2015   cardiologist-  dr Angelena Form  . Bilateral lower extremity edema   . BPH (benign prostatic hyperplasia)   . Cerebral microvascular disease    advanced and multiple foci throughout hemispheres and brain stem-- residual memory issues  . Chronic constipation   . Chronic kidney disease (CKD), stage III (moderate)    nephrologist-  dr Marval Regal [facility])  . Diastolic CHF, chronic ([place])   . Exertional dyspnea   . Foley catheter in place   . GERD (gastroesophageal reflux disease)   . Glaucoma, both eyes   .  History of adenomatous polyp of colon    2012  . History of CVA (cerebrovascular accident)    (pt had no symptoms other than memory issues) per MRI 11-29-2015  very small infarct right frontal lobe and multiple foci throughout hemispheres and brain stem  . 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 Hyperlipidemia   . Hypertension   . Iron deficiency anemia   . Memory loss   . Prostate cancer ([place])   . RBBB (right bundle branch block with left anterior fascicular block)   . Rosacea   . Type 2 diabetes mellitus ([place])   . Urine retention   . Vitamin B 12 deficiency    PSH: Past Surgical History:  Procedure Laterality Date  . AMPUTATION Left 09/14/2019   Procedure: LEFT BELOW KNEE AMPUTATION;  Surgeon: Newt Minion, MD;  Location: [place];  Service: Orthopedics;  Laterality: Left;  . CATARACT EXTRACTION W/ INTRAOCULAR LENS  IMPLANT, BILATERAL  2016  . IR THORACENTESIS ASP PLEURAL SPACE W/IMG GUIDE  12/01/2018  . ORIF PROXIMAL HUMEROUS FX AND BICEPS TENODESIS  11/09/2015  . OTHER SURGICAL HISTORY  1996   reconstruction left toe (amputation repair)  . PROSTATE BIOPSY N/A 07/22/2016   Procedure: BIOPSY TRANSRECTAL ULTRASONIC PROSTATE (TUBP);  Surgeon: Festus Aloe, MD;  Location: [facility];  Service: Urology;  Laterality: N/A;  . TONSILLECTOMY  1951  . TRANSTHORACIC ECHOCARDIOGRAM  08/23/2015   mild LVH, ef 55-60%, grade 1 diastolitc dysfunction  . WISDOM TOOTH EXTRACTION  1985   Past Medical History:  Diagnosis Date  . Anticoagulant long-term use    xarelto  . Arthritis    right hand  . At risk for sleep apnea    STOP-BANG==  6      PT ALREADY SET UP TO HAVE STUDY AT [facility] , PCP AWARE  . Atrial flutter, chronic [facility]) dx 01/ 2015   cardiologist-  dr Angelena Form  . Bilateral lower extremity edema   . BPH (benign prostatic hyperplasia)   . Cerebral microvascular disease    advanced and multiple foci throughout hemispheres and brain stem-- residual  memory issues  . Chronic constipation   . Chronic kidney disease (CKD), stage III (moderate)    nephrologist-  dr Marval Regal [facility])  . Diastolic CHF, chronic ([place])   . Exertional dyspnea   . Foley catheter in place   . GERD (gastroesophageal reflux disease)   . Glaucoma, both eyes   . History of adenomatous polyp of colon    2012  . History of CVA (cerebrovascular accident)    (pt had no symptoms other than memory issues) per MRI 11-29-2015  very small infarct right frontal lobe and multiple foci throughout hemispheres and brain stem  . 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 Hyperlipidemia   . Hypertension   . Iron deficiency anemia   . Memory loss   . Prostate cancer ([place])   . RBBB (right bundle branch block with left anterior fascicular block)   . Rosacea   . Type 2 diabetes mellitus ([place])   . Urine retention   . Vitamin B 12 deficiency     Medications:  I have reviewed the patient's current medications.  Facility-Administered Medications Prior to Admission  Medication Dose Route Frequency Provider Last Rate Last Admin  . 0.9 %  sodium chloride infusion  500 mL Intravenous Once Irene Shipper, MD       Medications Prior to Admission  Medication Sig Dispense Refill  . acetaminophen (TYLENOL) 500 MG tablet Take 500 mg by mouth every 6 (six) hours as needed for mild pain, moderate pain, fever or headache.     . Amino Acids-Protein Hydrolys (FEEDING SUPPLEMENT, PRO-STAT SUGAR FREE 64,) LIQD Take 30 mLs by mouth 3 (three) times daily with meals.    Marland Kitchen atorvastatin (LIPITOR) 20 MG tablet Take 1 tablet (20 mg total) by mouth daily. (Patient taking differently: Take 20 mg by mouth every evening. ) 30 tablet 3  . donepezil (ARICEPT) 10 MG tablet Take 1 tablet (10 mg total) by mouth at bedtime. 30 tablet 1  . dorzolamide (TRUSOPT) 2 % ophthalmic solution Place 1 drop into both eyes 2 (two) times daily.   3  . finasteride (PROSCAR) 5 MG tablet Take 5 mg by  mouth every evening.   3  . furosemide (LASIX) 40 MG tablet Take two (2) tablets (80 mg total) by mouth each morning and take one (1) tablet (40 mg total) by mouth each afternoon. Please make appt. (Patient taking differently: Take 40-80 mg by mouth See admin instructions. Take 80mg  by mouth in the morning and 40mg  in the evening.) 270 tablet 1  . HYDROcodone-acetaminophen (NORCO/VICODIN) 5-325 MG tablet Take 1 tablet by mouth every 4 (four) hours as needed for moderate  pain.    . insulin aspart (NOVOLOG) 100 UNIT/ML injection Inject 2-9 Units into the skin See admin instructions. Per Sliding Scale three times daily and at bedtime. 101-150 2 units 151-200 3 units 201-250 5 units 251-300 7 units 301-350 9 units > 350 11 units    . insulin glargine (LANTUS) 100 UNIT/ML injection Inject 10 Units into the skin 2 (two) times daily.     . Melatonin 3 MG TABS Take 3 mg by mouth at bedtime.    . metoprolol succinate (TOPROL-XL) 50 MG 24 hr tablet Take 1 tablet (50 mg total) by mouth 2 (two) times daily. Take with or immediately following a meal. (Patient taking differently: Take 25 mg by mouth daily. Take with or immediately following a meal.) 60 tablet 11  . Multiple Vitamin (MULTIVITAMIN WITH MINERALS) TABS tablet Take 1 tablet by mouth daily.    Marland Kitchen neomycin-bacitracin-polymyxin (NEOSPORIN) [phone] ointment Apply 1 application topically daily. Left lateral arm skin tear    . omeprazole (PRILOSEC) 20 MG capsule Take 20 mg by mouth every morning.     . potassium chloride SA (KLOR-CON) 20 MEQ tablet Take 20 mEq by mouth daily.    . rivaroxaban (XARELTO) 20 MG TABS tablet Take 1 tablet (20 mg total) by mouth daily with supper. (Patient taking differently: Take 20 mg by mouth daily. ) 30 tablet 6  . sertraline (ZOLOFT) 25 MG tablet Take 25 mg by mouth daily.    . tamsulosin (FLOMAX) 0.4 MG CAPS capsule Take 0.4 mg by mouth daily with breakfast.     . Vitamin D, Ergocalciferol, (DRISDOL) 1.25 MG (50000 UT)  CAPS capsule Take 50,000 Units by mouth every Wednesday.    Marland Kitchen Hyoscyamine Sulfate SL (LEVSIN/SL) 0.125 MG SUBL Dissolve 1 tablet on the tongue at the first sign of diarrhea episodes, you may use 1 tablet every 6 hours as needed. (Patient not taking: Reported on 09/10/2019) 90 each 4    ALLERGIES:   Allergies  Allergen Reactions  . Ciprofloxacin Diarrhea  . Neurontin [Gabapentin]     Hallucinations/altered mental status  . Oxycodone Other (See Comments)    Altered mental status    FAM HX: Family History  Problem Relation Age of Onset  . Cancer Sister        lung to brain/smoker  . Colon cancer Neg Hx   . Rectal cancer Neg Hx   . Stomach cancer Neg Hx   . CAD Neg Hx   . Dementia Neg Hx     Social History:   reports that he quit smoking about 36 years ago. He has a 40.00 pack-year smoking history. He has never used smokeless tobacco. He reports that he does not drink alcohol or use drugs.  ROS: 12 system ROS neg except per HPI  Blood pressure (!) 106/53, pulse 88, temperature (!) 100.4 F (38 C), temperature source Oral, resp. rate 16, height 5\' 7"  (1.702 m), weight 104.7 kg, SpO2 94 %. PHYSICAL EXAM: Gen: elderly man lying in bed sleeping  Eyes: anicteric ENT: MMM dry with him mouth breathing Neck: supple, no JVD CV:  RRR, no rub Abd:  Soft, obese Lungs: dec BS throughout, no rales appreciated GU: no foley - appears had condom cath but it's off; 171mL amber urine in bag Extr:  Trace LE edema, 1+ UE edema, L BKA dressing Neuro: per above will around to stimulation but quickly falls back asleep -- RN aware    Results for orders placed or performed during the  hospital encounter of 09/09/19 (from the past 48 hour(s))  Glucose, capillary     Status: Abnormal   Collection Time: 09/15/19  4:26 PM  Result Value Ref Range   Glucose-Capillary 266 (H) 70 - 99 mg/dL  Glucose, capillary     Status: Abnormal   Collection Time: 09/15/19  8:04 PM  Result Value Ref Range    Glucose-Capillary 235 (H) 70 - 99 mg/dL  Basic metabolic panel     Status: Abnormal   Collection Time: 09/16/19  1:24 AM  Result Value Ref Range   Sodium 138 135 - 145 mmol/L   Potassium 4.7 3.5 - 5.1 mmol/L   Chloride 98 98 - 111 mmol/L   CO2 28 22 - 32 mmol/L   Glucose, Bld 251 (H) 70 - 99 mg/dL   BUN 58 (H) 8 - 23 mg/dL   Creatinine, Ser 2.26 (H) 0.61 - 1.24 mg/dL   Calcium 8.3 (L) 8.9 - 10.3 mg/dL   GFR calc non Af Amer 27 (L) >60 mL/min   GFR calc Af Amer 31 (L) >60 mL/min   Anion gap 12 5 - 15    Comment: Performed at [facility], [address] [place], [place] [address]  CBC     Status: Abnormal   Collection Time: 09/16/19  1:24 AM  Result Value Ref Range   WBC 7.9 4.0 - 10.5 K/uL   RBC 4.32 4.22 - 5.81 MIL/uL   Hemoglobin 10.4 (L) 13.0 - 17.0 g/dL   HCT 38.5 (L) 39.0 - 52.0 %   MCV 89.1 80.0 - 100.0 fL   MCH 24.1 (L) 26.0 - 34.0 pg   MCHC 27.0 (L) 30.0 - 36.0 g/dL   RDW 17.6 (H) 11.5 - 15.5 %   Platelets 190 150 - 400 K/uL   nRBC 0.0 0.0 - 0.2 %    Comment: Performed at [facility], [place] [address] [place], [place] [address]  Glucose, capillary     Status: Abnormal   Collection Time: 09/16/19  6:50 AM  Result Value Ref Range   Glucose-Capillary 225 (H) 70 - 99 mg/dL  Glucose, capillary     Status: Abnormal   Collection Time: 09/16/19 11:32 AM  Result Value Ref Range   Glucose-Capillary 240 (H) 70 - 99 mg/dL  Glucose, capillary     Status: Abnormal   Collection Time: 09/16/19  5:29 PM  Result Value Ref Range   Glucose-Capillary 265 (H) 70 - 99 mg/dL  Glucose, capillary     Status: Abnormal   Collection Time: 09/16/19  8:41 PM  Result Value Ref Range   Glucose-Capillary 305 (H) 70 - 99 mg/dL  Basic metabolic panel     Status: Abnormal   Collection Time: 09/17/19  5:33 AM  Result Value Ref Range   Sodium 135 135 - 145 mmol/L   Potassium 5.1 3.5 - 5.1 mmol/L   Chloride 99 98 - 111 mmol/L   CO2 29 22 - 32 mmol/L   Glucose, Bld 252 (H) 70 - 99  mg/dL   BUN 75 (H) 8 - 23 mg/dL   Creatinine, Ser 2.67 (H) 0.61 - 1.24 mg/dL   Calcium 8.1 (L) 8.9 - 10.3 mg/dL   GFR calc non Af Amer 22 (L) >60 mL/min   GFR calc Af Amer 26 (L) >60 mL/min   Anion gap 7 5 - 15    Comment: Performed at [facility], [place] [address], [place], [place] [address]  Glucose, capillary  Status: Abnormal   Collection Time: 09/17/19  6:31 AM  Result Value Ref Range   Glucose-Capillary 230 (H) 70 - 99 mg/dL  Glucose, capillary     Status: Abnormal   Collection Time: 09/17/19 12:09 PM  Result Value Ref Range   Glucose-Capillary 262 (H) 70 - 99 mg/dL  Urinalysis, Routine w reflex microscopic     Status: Abnormal   Collection Time: 09/17/19 12:49 PM  Result Value Ref Range   Color, Urine YELLOW YELLOW   APPearance HAZY (A) CLEAR   Specific Gravity, Urine 1.015 1.005 - 1.030   pH 5.0 5.0 - 8.0   Glucose, UA NEGATIVE NEGATIVE mg/dL   Hgb urine dipstick NEGATIVE NEGATIVE   Bilirubin Urine NEGATIVE NEGATIVE   Ketones, ur NEGATIVE NEGATIVE mg/dL   Protein, ur NEGATIVE NEGATIVE mg/dL   Nitrite NEGATIVE NEGATIVE   Leukocytes,Ua LARGE (A) NEGATIVE   RBC / HPF 6-10 0 - 5 RBC/hpf   WBC, UA 21-50 0 - 5 WBC/hpf   Bacteria, UA RARE (A) NONE SEEN   Squamous Epithelial / LPF 0-5 0 - 5   Mucus PRESENT    Budding Yeast PRESENT     Comment: Performed at [facility], [address] [place], [place] [address]  Sodium, urine, random     Status: None   Collection Time: 09/17/19 12:50 PM  Result Value Ref Range   Sodium, Ur <10 mmol/L    Comment: Performed at [facility], [place] [address], [place], [place] [address]  Creatinine, urine, random     Status: None   Collection Time: 09/17/19 12:50 PM  Result Value Ref Range   Creatinine, Urine 158.93 mg/dL    Comment: Performed at [facility] [address], [place], [place] [address]    No results found.  Assessment/Plan  **AKI on CKD 3:  Nonoliguric. Pt with gradually worsening cr since  admission which is suspect is secondary to tubular injury from hemodynamics with modest hypotension though low urine Na today suggests hypovolemia.  Imaging pending to r/o obstruction; check bladder scan now. UA not consistent with GN though could be concerning for AIN with leukocytes present; antibiotics have been stopped and he has no fevers, rashes otherwise suggesting AIN so doubt.  No indications for RRT and hopefully with supportive care he will improve.  Avoid NSAIDs and other nephrotoxins, follow strict I/Os, daily weight, daily BMP for now.  Continue to hold home ACEi and diuretic.  **osteomyelitis L heel:  Was initially on antibiotics but now s/p L transtibial amputation 1/6.  Off antibiotics now.  Has wound vac.  Ortho following.   **Chronic hypoxic resp failure:  Has known chronic R pleural effusion. On home O2.    **Chronic HFrEF: does not appear to have acute exacerbation.  Can use diuretics to maintain volume status as needed.   **A flutter: rate controlled, off xarelto due to surgery.  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  DNR  Justin Mend 09/17/2019, 4:03 PM

## 2019-09-17 NOTE — Progress Notes (Signed)
Acute metabolic encephalopathy Patient noted to be somewhat drowsy and lethargic this morning however answering questions when awoken and drifted back off to sleep.  Head CT ordered currently pending.  ABG done with a pH of 7.29, PCO2 of 63, PO2 of 68, bicarb of 29.  Urinalysis concerning for possible UTI.  Urine cultures pending. Patient currently undergoing head CT.  Repeat chest x-ray. We will transfer patient to progressive care unit for closer monitoring.  Place on IV Solu-Medrol, Xopenex and Atrovent nebs, Pulmicort nebs, IV Rocephin, Claritin, Flonase.  Place on BiPAP.  Follow.  No charge.

## 2019-09-17 NOTE — Progress Notes (Signed)
Pts BIPAP order is PRN; pt not in need of BIPAP at this time. Pt respiratory status is stable on [place] 5LPM w/no distress noted at this time RT will continue to monitor.

## 2019-09-17 NOTE — Progress Notes (Signed)
ABG sample was collected and sent to Lab. Lab was called and notified of sample.

## 2019-09-17 NOTE — Progress Notes (Addendum)
PROGRESS NOTE    Darrell Mcclain  OMB:[id number] DOB: [birth year] DOA: 09/09/2019 PCP: Burnard Bunting, MD    Brief Narrative:  79 year old male with history of diabetes mellitus type II, prostate cancer, dementia, hypertension, hyperlipidemia, unspecified CVA, BPH with history of chronic indwelling Foley (not currently present), chronic diastolic CHF, chronic kidney disease stage III, chronic respiratory failure on home oxygen, atrial flutter on Xarelto presented with foot infection.  MRI of the left foot showed osteomyelitis.  He was started on broad-spectrum antibiotics.  Orthopedics was consulted.    Assessment & Plan:   Principal Problem:   Osteomyelitis ([place]) Active Problems:   Atrial flutter (HCC)   Chronic diastolic CHF (congestive heart failure), NYHA class 2 (HCC)   Memory loss   Uncontrolled diabetes mellitus ([place])   Morbid obesity (HCC)   Hyperlipidemia   Essential hypertension   Malignant neoplasm of prostate (HCC)   Stage 3a chronic kidney disease   Diabetic foot infection ([place])   Moderate protein-calorie malnutrition ([place])   Goals of care, counseling/discussion   Palliative care encounter   Pleural effusion   Chronic respiratory failure with hypoxia (HCC)   Vascular dementia without behavioral disturbance (HCC)   Hypokalemia  1 gangrenous left heel ulcer with acute osteomyelitis MRI of the left heel done consistent with left heel osteomyelitis.  ABIs were done.  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 [name].  IV antibiotics have been discontinued. Continue pain management.  Patient with wound VAC on and management per orthopedics.  2.  Chronic hypoxic respiratory failure on 2 L home O2/loculated chronic right sided pleural effusion CT chest which was done showed increased size of left pleural effusion, moderate now, persistent stable right pleural effusion and suspected atelectasis in the right lung base also  with stable mediastinal lymphadenopathy.  Chest x-ray done on admission showed slight increase in right loculated moderate pleural effusion.  Pulmonary was consulted and patient subsequently underwent ultrasound-guided thoracentesis of the right with fluid analysis consistent with exudative, lymphocytic predominant with 1100 nucleated cells with lymphocytosis but no malignancy identified.  Per pulmonary likely related to chronic medical conditions and suspected will reaccumulate like he did in the past.  Per pulmonary no further recommendations at this time.  Follow.  3.  Chronic diastolic heart failure/paroxysmal atrial flutter 2D echo from 07/01/2019 with a EF of 50 to 55% with moderate TR and severely elevated pulmonary artery pressure.  Patient with a urine output of 800 cc over the past 24 hours.  Patient with sats of 94% on 2 L nasal cannula.  Blood pressure borderline.  Lasix and lisinopril have been held.  Norvasc and Toprol-XL have been discontinued.  Continue Lipitor.  Orthopedics to advise when Xarelto may be resumed.  Orthopedics to advise when Xarelto may be resumed.  Follow.  4.  Moderate protein calorie malnutrition  5.  Diabetes mellitus type 2 Hemoglobin A1c 6.8.  Lantus was discontinued as patient noted to have a hypoglycemic episode the morning of 09/13/2019.  CBG of 230 this morning.  Increased Lantus to 8 units daily.  Sliding scale insulin.   6.  Acute on chronic kidney disease stage III Creatinine trending up.  Patient noted to have borderline blood pressure.  Diuretic and ACE inhibitor have been discontinued.  Hold oral antihypertensives.  Check a UA with cultures and sensitivities, check a urine sodium, check a urine creatinine.  Check a renal ultrasound.  Vancomycin was discontinued with last dose on 09/14/2019.  Increase IV  fluids to 100 cc/h.  Consult with nephrology for further evaluation and management.  Follow.   7.  Hypokalemia Repleted.  Potassium of 5.1.  8.  Anemia of  chronic disease H&H stable at 10.4.  Follow.  9.  Hyperlipidemia Continue statin.  10.  Hypertension Blood pressure borderline.  Antihypertensive medications discontinued.  Increase IV fluids to 100 cc/h.  Follow.    11.  History of prostate cancer Status post treatment in 2018  12.  Vascular dementia/acute metabolic encephalopathy Patient drowsy today however opens eyes and answers questions appropriately but drifts back off to sleep.  Check a head CT, check a B12, RBC folate, TSH, ammonia level.  Neurontin has been discontinued.  Continue Aricept.  Fall precautions.  Palliative care consulted and are following.    DVT prophylaxis: Heparin Code Status: DNR Family Communication: Updated patient.  No family at bedside.  Disposition Plan: CIR versus SNF   Consultants:   Orthopedics: Dr.[name] 09/10/2019  Orthopedics: Dr. Lyla Glassing 09/10/2019  Wound care  Rib/PCCM: Dr. Valeta Harms 09/12/2019  Palliative care: Vinie Sill, NP/Dr. Rowe Pavy 09/13/2019  Inpatient rehab  Procedures:   Transtibial amputation left lower extremity per Dr. [name] 09/14/2019  CT chest 09/12/2019  Chest x-ray 09/10/2019, 09/12/2019  Plain films of the orbits 09/10/2019  Plain films of the left foot 09/09/2019  MRI right heel 09/10/2019  MRI left heel 09/10/2019  ABIs with/without TBI 09/11/2019  Ultrasound-guided thoracentesis per Dr. Tamala Julian, pulmonary 09/12/2019  CT head pending 09/17/2019  Renal ultrasound 09/17/2019  Antimicrobials:   IV vancomycin 09/10/2019>>>> 09/14/2019  IV Flagyl 09/10/2019>>>>>> 09/16/2019  IV Rocephin 09/10/2019>>>>> 09/16/2019  IV Zosyn x1 dose 09/10/2019   Subjective: Patient sleeping.  Drowsy.  Arousable but drifts back off to sleep.  Denies any chest pain or shortness of breath.   Objective: Vitals:   09/16/19 2002 09/17/19 0248 09/17/19 0324 09/17/19 0700  BP: (!) 143/77 102/66  103/78  Pulse: 80 83  85  Resp:      Temp: 97.7 F (36.5 C) 97.6 F (36.4 C)  98.1 F (36.7 C)  TempSrc: Oral  Oral  Oral  SpO2: 92% 92%  94%  Weight:   104.7 kg   Height:        Intake/Output Summary (Last 24 hours) at 09/17/2019 1202 Last data filed at 09/17/2019 0900 Gross per 24 hour  Intake 1323.55 ml  Output 800 ml  Net 523.55 ml   Filed Weights   09/13/19 0500 09/16/19 0500 09/17/19 0324  Weight: 99.3 kg 100.6 kg 104.7 kg    Examination:  General exam: NAD. Respiratory system: CTAB.  No wheezes, no crackles, no rhonchi.  Normal respiratory effort.   Cardiovascular system: Regular rate rhythm no murmurs rubs or gallops.  No JVD.  No lower extremity edema.  Gastrointestinal system: Abdomen is soft, nontender, nondistended, positive bowel sounds.  No rebound.  No guarding. Central nervous system: Alert and oriented. No focal neurological deficits. Extremities: s/p left transtibial amputation and shrinker with wound VAC.  Skin: No rashes, lesions or ulcers Psychiatry: Judgement and insight appear normal. Mood & affect appropriate.     Data Reviewed: I have personally reviewed following labs and imaging studies  CBC: Recent Labs  Lab 09/12/19 0246 09/13/19 0627 09/14/19 0315 09/15/19 0505 09/16/19 0124  WBC 8.1 10.7* 8.1 8.6 7.9  NEUTROABS 6.4 7.8* 6.3 7.5  --   HGB 11.0* 10.7* 10.4* 10.0* 10.4*  HCT 39.3 36.7* 36.2* 35.6* 38.5*  MCV 88.3 86.2 87.9 87.3 89.1  PLT 214  218 192 186 638   Basic Metabolic Panel: Recent Labs  Lab 09/12/19 0246 09/13/19 0627 09/14/19 0315 09/15/19 0505 09/16/19 0124 09/17/19 0533  NA 143 142 140 140 138 135  K 3.6 3.4* 4.0 4.6 4.7 5.1  CL 103 101 100 99 98 99  CO2 32 31 32 28 28 29   GLUCOSE 150* 26* 129* 258* 251* 252*  BUN 32* 36* 39* 48* 58* 75*  CREATININE 1.31* 1.41* 1.51* 1.72* 2.26* 2.67*  CALCIUM 8.3* 8.2* 8.2* 8.1* 8.3* 8.1*  MG 1.9 1.9 1.9  --   --   --    GFR: Estimated Creatinine Clearance: 27.1 mL/min (A) (by C-G formula based on SCr of 2.67 mg/dL (H)). Liver Function Tests: No results for input(s): AST, ALT, ALKPHOS,  BILITOT, PROT, ALBUMIN in the last 168 hours. No results for input(s): LIPASE, AMYLASE in the last 168 hours. No results for input(s): AMMONIA in the last 168 hours. Coagulation Profile: No results for input(s): INR, PROTIME in the last 168 hours. Cardiac Enzymes: No results for input(s): CKTOTAL, CKMB, CKMBINDEX, TROPONINI in the last 168 hours. BNP (last 3 results) No results for input(s): PROBNP in the last 8760 hours. HbA1C: No results for input(s): HGBA1C in the last 72 hours. CBG: Recent Labs  Lab 09/16/19 0650 09/16/19 1132 09/16/19 1729 09/16/19 2041 09/17/19 0631  GLUCAP 225* 240* 265* 305* 230*   Lipid Profile: No results for input(s): CHOL, HDL, LDLCALC, TRIG, CHOLHDL, LDLDIRECT in the last 72 hours. Thyroid Function Tests: No results for input(s): TSH, T4TOTAL, FREET4, T3FREE, THYROIDAB in the last 72 hours. Anemia Panel: No results for input(s): VITAMINB12, FOLATE, FERRITIN, TIBC, IRON, RETICCTPCT in the last 72 hours. Sepsis Labs: No results for input(s): PROCALCITON, LATICACIDVEN in the last 168 hours.  Recent Results (from the past 240 hour(s))  Blood culture (routine x 2)     Status: None   Collection Time: 09/09/19  3:48 PM   Specimen: BLOOD  Result Value Ref Range Status   Specimen Description BLOOD RIGHT ANTECUBITAL  Final   Special Requests   Final    BOTTLES DRAWN AEROBIC AND ANAEROBIC Blood Culture adequate volume   Culture   Final    NO GROWTH 5 DAYS Performed at [facility], [address], [place], [place] [address]    Report Status 09/14/2019 FINAL  Final  Blood culture (routine x 2)     Status: None   Collection Time: 09/09/19  4:37 PM   Specimen: BLOOD  Result Value Ref Range Status   Specimen Description BLOOD LEFT ANTECUBITAL  Final   Special Requests   Final    BOTTLES DRAWN AEROBIC AND ANAEROBIC Blood Culture results may not be optimal due to an inadequate volume of blood received in culture bottles   Culture   Final    NO  GROWTH 5 DAYS Performed at [facility], [place] [address], [place], [place] [address]    Report Status 09/14/2019 FINAL  Final  SARS CORONAVIRUS 2 (TAT 6-24 HRS) Nasopharyngeal Nasopharyngeal Swab     Status: None   Collection Time: 09/10/19  3:55 AM   Specimen: Nasopharyngeal Swab  Result Value Ref Range Status   SARS Coronavirus 2 NEGATIVE NEGATIVE Final    Comment: (NOTE) SARS-CoV-2 target nucleic acids are NOT DETECTED. The SARS-CoV-2 RNA is generally detectable in upper and lower respiratory specimens during the acute phase of infection. Negative results do not preclude SARS-CoV-2 infection, do not rule out co-infections with other pathogens,  and should not be used as the sole basis for treatment or other patient management decisions. Negative results must be combined with clinical observations, patient history, and epidemiological information. The expected result is Negative. Fact Sheet for Patients: SugarRoll.be Fact Sheet for Healthcare Providers: https://www.woods-mathews.com/ This test is not yet approved or cleared by the [place] FDA and  has been authorized for detection and/or diagnosis of SARS-CoV-2 by FDA under an Emergency Use Authorization (EUA). This EUA will remain  in effect (meaning this test can be used) for the duration of the COVID-19 declaration under Section 56 4(b)(1) of the Act, 21 U.S.C. section 360bbb-3(b)(1), unless the authorization is terminated or revoked sooner. Performed at [facility], [place] [address] [place], [place] [address]   MRSA PCR Screening     Status: None   Collection Time: 09/10/19  1:05 PM   Specimen: Nasal Mucosa; Nasopharyngeal  Result Value Ref Range Status   MRSA by PCR NEGATIVE NEGATIVE Final    Comment:        The GeneXpert MRSA Assay (FDA approved for NASAL specimens only), is one component of a comprehensive MRSA colonization surveillance program. It is  not intended to diagnose MRSA infection nor to guide or monitor treatment for MRSA infections. Performed at [facility], [place] [address], [place], [place] [address]   Body fluid culture (includes gram stain)     Status: None   Collection Time: 09/12/19  2:54 PM   Specimen: Pleural Fluid  Result Value Ref Range Status   Specimen Description PLEURAL  Final   Special Requests Normal  Final   Gram Stain NO WBC SEEN NO ORGANISMS SEEN   Final   Culture   Final    NO GROWTH 3 DAYS Performed at [facility], [address] [place], [place] [address]    Report Status 09/15/2019 FINAL  Final         Radiology Studies: No results found.      Scheduled Meds: . atorvastatin  20 mg Oral QPM  . docusate sodium  100 mg Oral BID  . donepezil  10 mg Oral QHS  . dorzolamide  1 drop Both Eyes BID  . finasteride  5 mg Oral QPM  . heparin injection (subcutaneous)  5,000 Units Subcutaneous Q8H  . insulin aspart  0-5 Units Subcutaneous QHS  . insulin aspart  0-9 Units Subcutaneous TID WC  . insulin glargine  8 Units Subcutaneous Daily  . multivitamin  1 tablet Oral Daily  . nutrition supplement (JUVEN)  1 packet Oral BID BM  . pantoprazole  40 mg Oral Daily  . Ensure Max Protein  11 oz Oral QHS  . sodium chloride flush  10-40 mL Intracatheter Q12H  . tamsulosin  0.4 mg Oral Q breakfast   Continuous Infusions: . sodium chloride 100 mL/hr at 09/17/19 0845     LOS: 7 days    Time spent: 35 minutes    Irine Seal, MD [facility]  If 7PM-7AM, please contact night-coverage [email or web address] 09/17/2019, 12:02 PM

## 2019-09-18 DIAGNOSIS — E875 Hyperkalemia: Secondary | ICD-10-CM

## 2019-09-18 DIAGNOSIS — R0689 Other abnormalities of breathing: Secondary | ICD-10-CM

## 2019-09-18 DIAGNOSIS — N179 Acute kidney failure, unspecified: Secondary | ICD-10-CM

## 2019-09-18 DIAGNOSIS — N390 Urinary tract infection, site not specified: Secondary | ICD-10-CM

## 2019-09-18 DIAGNOSIS — G9340 Encephalopathy, unspecified: Secondary | ICD-10-CM

## 2019-09-18 LAB — BLOOD GAS, ARTERIAL
Acid-base deficit: 0.3 mmol/L (ref 0.0–2.0)
Bicarbonate: 25.7 mmol/L (ref 20.0–28.0)
Drawn by: 34719
FIO2: 40
O2 Saturation: 96.2 %
Patient temperature: 37.3
pCO2 arterial: 58.4 mmHg — ABNORMAL HIGH (ref 32.0–48.0)
pH, Arterial: 7.269 — ABNORMAL LOW (ref 7.350–7.450)
pO2, Arterial: 85.7 mmHg (ref 83.0–108.0)

## 2019-09-18 LAB — RENAL FUNCTION PANEL
Albumin: 2.2 g/dL — ABNORMAL LOW (ref 3.5–5.0)
Albumin: 2.2 g/dL — ABNORMAL LOW (ref 3.5–5.0)
Anion gap: 11 (ref 5–15)
Anion gap: 7 (ref 5–15)
BUN: 86 mg/dL — ABNORMAL HIGH (ref 8–23)
BUN: 89 mg/dL — ABNORMAL HIGH (ref 8–23)
CO2: 24 mmol/L (ref 22–32)
CO2: 25 mmol/L (ref 22–32)
Calcium: 8.2 mg/dL — ABNORMAL LOW (ref 8.9–10.3)
Calcium: 7.9 mg/dL — ABNORMAL LOW (ref 8.9–10.3)
Chloride: 102 mmol/L (ref 98–111)
Chloride: 102 mmol/L (ref 98–111)
Creatinine, Ser: 2.78 mg/dL — ABNORMAL HIGH (ref 0.61–1.24)
Creatinine, Ser: 2.74 mg/dL — ABNORMAL HIGH (ref 0.61–1.24)
GFR calc Af Amer: 25 mL/min — ABNORMAL LOW (ref >60)
GFR calc Af Amer: 25 mL/min — ABNORMAL LOW (ref >60)
GFR calc non Af Amer: 21 mL/min — ABNORMAL LOW (ref >60)
GFR calc non Af Amer: 22 mL/min — ABNORMAL LOW (ref >60)
Glucose, Bld: 308 mg/dL — ABNORMAL HIGH (ref 70–99)
Glucose, Bld: 426 mg/dL — ABNORMAL HIGH (ref 70–99)
Phosphorus: 5 mg/dL — ABNORMAL HIGH (ref 2.5–4.6)
Phosphorus: 4.7 mg/dL — ABNORMAL HIGH (ref 2.5–4.6)
Potassium: 5.1 mmol/L (ref 3.5–5.1)
Potassium: 6 mmol/L — ABNORMAL HIGH (ref 3.5–5.1)
Sodium: 137 mmol/L (ref 135–145)
Sodium: 134 mmol/L — ABNORMAL LOW (ref 135–145)

## 2019-09-18 LAB — CBC WITH DIFFERENTIAL/PLATELET
Abs Immature Granulocytes: 0.1 10*3/uL — ABNORMAL HIGH (ref 0.00–0.07)
Basophils Absolute: 0 10*3/uL (ref 0.0–0.1)
Basophils Relative: 0 %
Eosinophils Absolute: 0 10*3/uL (ref 0.0–0.5)
Eosinophils Relative: 0 %
HCT: 38.7 % — ABNORMAL LOW (ref 39.0–52.0)
Hemoglobin: 10.9 g/dL — ABNORMAL LOW (ref 13.0–17.0)
Lymphocytes Relative: 6 %
Lymphs Abs: 0.4 10*3/uL — ABNORMAL LOW (ref 0.7–4.0)
MCH: 24.6 pg — ABNORMAL LOW (ref 26.0–34.0)
MCHC: 28.2 g/dL — ABNORMAL LOW (ref 30.0–36.0)
MCV: 87.4 fL (ref 80.0–100.0)
Monocytes Absolute: 0.1 10*3/uL (ref 0.1–1.0)
Monocytes Relative: 1 %
Myelocytes: 1 %
Neutro Abs: 6.3 10*3/uL (ref 1.7–7.7)
Neutrophils Relative %: 92 %
Platelets: 162 10*3/uL (ref 150–400)
RBC: 4.43 MIL/uL (ref 4.22–5.81)
RDW: 17.5 % — ABNORMAL HIGH (ref 11.5–15.5)
WBC: 6.8 10*3/uL (ref 4.0–10.5)
nRBC: 0 % (ref 0.0–0.2)
nRBC: 0 /100 WBC

## 2019-09-18 LAB — GLUCOSE, CAPILLARY
Glucose-Capillary: 300 mg/dL — ABNORMAL HIGH (ref 70–99)
Glucose-Capillary: 317 mg/dL — ABNORMAL HIGH (ref 70–99)
Glucose-Capillary: 415 mg/dL — ABNORMAL HIGH (ref 70–99)
Glucose-Capillary: 401 mg/dL — ABNORMAL HIGH (ref 70–99)

## 2019-09-18 LAB — POTASSIUM: Potassium: 4.8 mmol/L (ref 3.5–5.1)

## 2019-09-18 LAB — FOLATE RBC
Folate, Hemolysate: 586 ng/mL
Folate, RBC: 1744 ng/mL (ref >498)
Hematocrit: 33.6 % — ABNORMAL LOW (ref 37.5–51.0)

## 2019-09-18 LAB — AMMONIA: Ammonia: 57 umol/L — ABNORMAL HIGH (ref 9–35)

## 2019-09-18 MED ORDER — SODIUM ZIRCONIUM CYCLOSILICATE 5 G PO PACK
5.0000 g | PACK | Freq: Two times a day (BID) | ORAL | Status: DC
  Filled 2019-09-18: qty 1

## 2019-09-18 MED ORDER — INSULIN ASPART 100 UNIT/ML ~~LOC~~ SOLN
0.0000 [IU] | Freq: Every day | SUBCUTANEOUS | Status: DC
  Administered 2019-09-18: 5 [IU] via SUBCUTANEOUS
  Administered 2019-09-19: 3 [IU] via SUBCUTANEOUS
  Administered 2019-09-20: 5 [IU] via SUBCUTANEOUS
  Administered 2019-09-21: 3 [IU] via SUBCUTANEOUS
  Administered 2019-09-22: 4 [IU] via SUBCUTANEOUS

## 2019-09-18 MED ORDER — INSULIN ASPART 100 UNIT/ML ~~LOC~~ SOLN
0.0000 [IU] | Freq: Three times a day (TID) | SUBCUTANEOUS | Status: DC
  Administered 2019-09-18: 11 [IU] via SUBCUTANEOUS
  Administered 2019-09-19 (×2): 15 [IU] via SUBCUTANEOUS
  Administered 2019-09-19 – 2019-09-20 (×4): 11 [IU] via SUBCUTANEOUS
  Administered 2019-09-21: 15 [IU] via SUBCUTANEOUS
  Administered 2019-09-21: 8 [IU] via SUBCUTANEOUS
  Administered 2019-09-21: 11 [IU] via SUBCUTANEOUS
  Administered 2019-09-22: 8 [IU] via SUBCUTANEOUS
  Administered 2019-09-22: 11 [IU] via SUBCUTANEOUS
  Administered 2019-09-22: 8 [IU] via SUBCUTANEOUS

## 2019-09-18 MED ORDER — LACTULOSE 10 GM/15ML PO SOLN
20.0000 g | Freq: Two times a day (BID) | ORAL | Status: AC
  Administered 2019-09-18: 20 g via ORAL
  Filled 2019-09-18: qty 30

## 2019-09-18 MED ORDER — RIVAROXABAN 15 MG PO TABS
15.0000 mg | ORAL_TABLET | Freq: Every day | ORAL | Status: DC
  Administered 2019-09-18 – 2019-09-22 (×5): 15 mg via ORAL
  Filled 2019-09-18 (×5): qty 1

## 2019-09-18 MED ORDER — SODIUM ZIRCONIUM CYCLOSILICATE 10 G PO PACK
10.0000 g | PACK | Freq: Two times a day (BID) | ORAL | Status: AC
  Administered 2019-09-18 – 2019-09-19 (×2): 10 g via ORAL
  Filled 2019-09-18 (×2): qty 1

## 2019-09-18 MED ORDER — INSULIN ASPART 100 UNIT/ML ~~LOC~~ SOLN
19.0000 [IU] | Freq: Once | SUBCUTANEOUS | Status: AC
  Administered 2019-09-18: 19 [IU] via SUBCUTANEOUS

## 2019-09-18 MED ORDER — INSULIN GLARGINE 100 UNIT/ML ~~LOC~~ SOLN
12.0000 [IU] | Freq: Every day | SUBCUTANEOUS | Status: DC
  Administered 2019-09-18: 12 [IU] via SUBCUTANEOUS
  Filled 2019-09-18 (×2): qty 0.12

## 2019-09-18 MED ORDER — CYANOCOBALAMIN 1000 MCG/ML IJ SOLN
1000.0000 ug | Freq: Every day | INTRAMUSCULAR | Status: DC
  Administered 2019-09-18 – 2019-09-22 (×4): 1000 ug via SUBCUTANEOUS
  Filled 2019-09-18 (×5): qty 1

## 2019-09-18 NOTE — Progress Notes (Signed)
PROGRESS NOTE    Darrell Mcclain  ZOX:[id number] DOB: 10/22/1940 DOA: 09/09/2019 PCP: Burnard Bunting, MD    Brief Narrative:  79 year old male with history of diabetes mellitus type II, prostate cancer, dementia, hypertension, hyperlipidemia, unspecified CVA, BPH with history of chronic indwelling Foley (not currently present), chronic diastolic CHF, chronic kidney disease stage III, chronic respiratory failure on home oxygen, atrial flutter on Xarelto presented with foot infection.  MRI of the left foot showed osteomyelitis.  He was started on broad-spectrum antibiotics.  Orthopedics was consulted.    Assessment & Plan:   Principal Problem:   Osteomyelitis ([place]) Active Problems:   Atrial flutter (HCC)   Chronic diastolic CHF (congestive heart failure), NYHA class 2 (HCC)   Memory loss   Uncontrolled diabetes mellitus ([place])   Morbid obesity (HCC)   Hyperlipidemia   Essential hypertension   Malignant neoplasm of prostate (HCC)   Stage 3a chronic kidney disease   Diabetic foot infection ([place])   Moderate protein-calorie malnutrition ([place])   Goals of care, counseling/discussion   Palliative care encounter   Pleural effusion   Chronic respiratory failure with hypoxia (HCC)   Vascular dementia without behavioral disturbance (HCC)   Hypokalemia   Acute renal failure (HCC)   Encephalopathy acute   Hypercarbia   Hyperkalemia   Acute lower UTI  1 gangrenous left heel ulcer with acute osteomyelitis MRI of the left heel done consistent with left heel osteomyelitis.  ABIs were done.  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 [name].  IV antibiotics have been discontinued. Continue pain management.  Patient with wound VAC on and management per orthopedics.  2.  Chronic hypoxic respiratory failure on 2 L home O2/loculated chronic right sided pleural effusion CT chest which was done showed increased size of left pleural effusion,  moderate now, persistent stable right pleural effusion and suspected atelectasis in the right lung base also with stable mediastinal lymphadenopathy.  Chest x-ray done on admission showed slight increase in right loculated moderate pleural effusion.  Pulmonary was consulted and patient subsequently underwent ultrasound-guided thoracentesis of the right with fluid analysis consistent with exudative, lymphocytic predominant with 1100 nucleated cells with lymphocytosis but no malignancy identified.  Per pulmonary likely related to chronic medical conditions and suspected will reaccumulate like he did in the past.  Per pulmonary no further recommendations at this time.  Follow.  3.  Chronic diastolic heart failure/paroxysmal atrial flutter 2D echo from 07/01/2019 with a EF of 50 to 55% with moderate TR and severely elevated pulmonary artery pressure.  Patient with a urine output of 1.1L over the past 24 hours.  Patient with sats of 92% on 4 L nasal cannula.  Blood pressure borderline.  Lasix and lisinopril have been held.  Norvasc and Toprol-XL have been discontinued.  Continue Lipitor.  Discussed with Dr. [name] orthopedics and will resume patient's Xarelto today.  Xarelto will be resumed at a decreased dose due to renal function. Follow.  4.  Moderate protein calorie malnutrition  5.  Diabetes mellitus type 2 Hemoglobin A1c 6.8.  Lantus was discontinued as patient noted to have a hypoglycemic episode the morning of 09/13/2019.  CBG of 300 this morning.  Increase Lantus to 12 units daily.  Sliding scale insulin.   6.  Acute on chronic kidney disease stage III Creatinine trending up.  Patient noted to have borderline blood pressure.  Diuretic and ACE inhibitor have been discontinued.  Likely secondary to a prerenal azotemia.  Urinalysis with large  leukocytes, 21-50 WBCs.  Urine sodium less than 10.  Renal ultrasound negative for hydronephrosis.  Continue to hold oral antihypertensives.  Vancomycin was  discontinued with last dose on 09/14/2019.  Continue current IV fluids at 100 cc/h and monitor closely for volume overload.  Avoid nephrotoxins.  Nephrology following and I appreciate the input and recommendations.  7.  Hypokalemia/hyperkalemia Repleted.  Potassium of 5.1 earlier this morning.  Repeat potassium is 6.0.  Lokelma 5 mg twice daily.  Check a EKG.  Repeat potassium level after Lokelma is given.  Patient also to receive NovoLog.  Follow..  8.  Anemia of chronic disease H&H stable at 10.9.  Follow.  9.  Hyperlipidemia Continue statin.  10.  Hypertension Blood pressure improved with hydration.  Continue to hold antihypertensive medications.  Continue IV fluids.  Follow.   11.  History of prostate cancer Status post treatment in 2018  12.  Vascular dementia/acute metabolic encephalopathy secondary to hypercarbia Patient drowsy on 09/17/2019.  Clinically improved and more alert today.  Head CT done was negative for any acute abnormalities.  TSH within normal limits.  Ammonia level was elevated at 37 repeat ammonia levels noted at 57.  Vitamin B12 445.  TSH within normal limits at 1.289.  RBC folate pending.  Neurontin has been discontinued.  ABG obtained concerning for respiratory acidosis and as such patient started on IV Solu-Medrol, Pulmicort, Xopenex and Atrovent nebs, Claritin, PPI, IV Rocephin.  Continue Aricept.  We will give a dose of lactulose and repeat ammonia levels in the morning.  Place on vitamin B12 1000 MCG's subcutaneously daily x7 days.  Continue Aricept.  Fall precautions.  Palliative care consulted and are following.  13.  Hyperkalemia Likely secondary to worsening renal function.  Lokelma 10 mg p.o. twice daily.  Patient to receive NovoLog x1 due to elevated blood glucose levels.  Repeat potassium level 2 hours after Lokelma is given.  14.  Probable UTI Urine cultures pending.  IV Rocephin.   DVT prophylaxis: Heparin>> Xarelto Code Status: DNR Family  Communication: Updated patient.  No family at bedside.  Disposition Plan: CIR versus SNF   Consultants:   Orthopedics: Dr.[name] 09/10/2019  Orthopedics: Dr. Lyla Glassing 09/10/2019  Wound care  Rib/PCCM: Dr. Valeta Harms 09/12/2019  Palliative care: Vinie Sill, NP/Dr. Rowe Pavy 09/13/2019  Inpatient rehab  Nephrology: Dr. Johnney Ou 09/17/2019  Procedures:   Transtibial amputation left lower extremity per Dr. [name] 09/14/2019  CT chest 09/12/2019  Chest x-ray 09/10/2019, 09/12/2019  Plain films of the orbits 09/10/2019  Plain films of the left foot 09/09/2019  MRI right heel 09/10/2019  MRI left heel 09/10/2019  ABIs with/without TBI 09/11/2019  Ultrasound-guided thoracentesis per Dr. Tamala Julian, pulmonary 09/12/2019  CT head 09/17/2019  Renal ultrasound 09/17/2019  Antimicrobials:   IV vancomycin 09/10/2019>>>> 09/14/2019  IV Flagyl 09/10/2019>>>>>> 09/16/2019  IV Rocephin 09/10/2019>>>>> 09/16/2019  IV Zosyn x1 dose 09/10/2019  IV Rocephin 09/17/2019   Subjective: Patient more alert today.  Awake.  Denies chest pain.  No shortness of breath.  Currently on bedpan having bowel movement.   Objective: Vitals:   09/18/19 0624 09/18/19 0720 09/18/19 0815 09/18/19 1223  BP:   133/71 139/74  Pulse:   79 74  Resp:   17 16  Temp:   97.6 F (36.4 C) 97.6 F (36.4 C)  TempSrc:   Oral Oral  SpO2:  95% 92% 97%  Weight: 103.3 kg     Height:        Intake/Output Summary (Last 24 hours) at  09/18/2019 1729 Last data filed at 09/18/2019 9563 Gross per 24 hour  Intake 2130.2 ml  Output 500 ml  Net 1630.2 ml   Filed Weights   09/17/19 0324 09/17/19 2050 09/18/19 0624  Weight: 104.7 kg 102.6 kg 103.3 kg    Examination:  General exam: NAD.  Dry mucous membranes. Respiratory system: Clear to auscultation bilaterally anterior lung fields.  No wheezes, no crackles, no rhonchi.  Normal respiratory effort.  Cardiovascular system: RRR no murmurs rubs or gallops.  No JVD.  No lower extremity edema.  Gastrointestinal system:  Abdomen is nontender, nondistended, soft, positive bowel sounds.  No rebound.  No guarding.  Central nervous system: Alert and oriented. No focal neurological deficits. Extremities: s/p left transtibial amputation and shrinker with wound VAC.  Skin: No rashes, lesions or ulcers Psychiatry: Judgement and insight appear fair. Mood & affect appropriate.     Data Reviewed: I have personally reviewed following labs and imaging studies  CBC: Recent Labs  Lab 09/12/19 0246 09/13/19 0627 09/14/19 0315 09/15/19 0505 09/16/19 0124 09/18/19 0918  WBC 8.1 10.7* 8.1 8.6 7.9 6.8  NEUTROABS 6.4 7.8* 6.3 7.5  --  6.3  HGB 11.0* 10.7* 10.4* 10.0* 10.4* 10.9*  HCT 39.3 36.7* 36.2* 35.6* 38.5* 38.7*  MCV 88.3 86.2 87.9 87.3 89.1 87.4  PLT 214 218 192 186 190 875   Basic Metabolic Panel: Recent Labs  Lab 09/12/19 0246 09/13/19 0627 09/14/19 0315 09/15/19 0505 09/16/19 0124 09/17/19 0533 09/18/19 0756 09/18/19 1544  NA 143 142 140 140 138 135 137 134*  K 3.6 3.4* 4.0 4.6 4.7 5.1 5.1 6.0*  CL 103 101 100 99 98 99 102 102  CO2 32 31 32 28 28 29 24 25   GLUCOSE 150* 26* 129* 258* 251* 252* 308* 426*  BUN 32* 36* 39* 48* 58* 75* 86* 89*  CREATININE 1.31* 1.41* 1.51* 1.72* 2.26* 2.67* 2.78* 2.74*  CALCIUM 8.3* 8.2* 8.2* 8.1* 8.3* 8.1* 8.2* 7.9*  MG 1.9 1.9 1.9  --   --   --   --   --   PHOS  --   --   --   --   --   --  5.0* 4.7*   GFR: Estimated Creatinine Clearance: 26.3 mL/min (A) (by C-G formula based on SCr of 2.74 mg/dL (H)). Liver Function Tests: Recent Labs  Lab 09/18/19 0756 09/18/19 1544  ALBUMIN 2.2* 2.2*   No results for input(s): LIPASE, AMYLASE in the last 168 hours. Recent Labs  Lab 09/17/19 1657 09/18/19 0918  AMMONIA 37* 57*   Coagulation Profile: No results for input(s): INR, PROTIME in the last 168 hours. Cardiac Enzymes: No results for input(s): CKTOTAL, CKMB, CKMBINDEX, TROPONINI in the last 168 hours. BNP (last 3 results) No results for input(s): PROBNP  in the last 8760 hours. HbA1C: No results for input(s): HGBA1C in the last 72 hours. CBG: Recent Labs  Lab 09/17/19 1654 09/17/19 2232 09/18/19 0626 09/18/19 1226 09/18/19 1647  GLUCAP 258* 252* 300* 317* 415*   Lipid Profile: No results for input(s): CHOL, HDL, LDLCALC, TRIG, CHOLHDL, LDLDIRECT in the last 72 hours. Thyroid Function Tests: Recent Labs    09/17/19 1657  TSH 1.289   Anemia Panel: Recent Labs    09/17/19 1657  VITAMINB12 445   Sepsis Labs: No results for input(s): PROCALCITON, LATICACIDVEN in the last 168 hours.  Recent Results (from the past 240 hour(s))  Blood culture (routine x 2)     Status: None   Collection  Time: 09/09/19  3:48 PM   Specimen: BLOOD  Result Value Ref Range Status   Specimen Description BLOOD RIGHT ANTECUBITAL  Final   Special Requests   Final    BOTTLES DRAWN AEROBIC AND ANAEROBIC Blood Culture adequate volume   Culture   Final    NO GROWTH 5 DAYS Performed at [facility], [address], [place], [place] [address]    Report Status 09/14/2019 FINAL  Final  Blood culture (routine x 2)     Status: None   Collection Time: 09/09/19  4:37 PM   Specimen: BLOOD  Result Value Ref Range Status   Specimen Description BLOOD LEFT ANTECUBITAL  Final   Special Requests   Final    BOTTLES DRAWN AEROBIC AND ANAEROBIC Blood Culture results may not be optimal due to an inadequate volume of blood received in culture bottles   Culture   Final    NO GROWTH 5 DAYS Performed at [facility], [place] [address], [place],  [place] [address]    Report Status 09/14/2019 FINAL  Final  SARS CORONAVIRUS 2 (TAT 6-24 HRS) Nasopharyngeal Nasopharyngeal Swab     Status: None   Collection Time: 09/10/19  3:55 AM   Specimen: Nasopharyngeal Swab  Result Value Ref Range Status   SARS Coronavirus 2 NEGATIVE NEGATIVE Final    Comment: (NOTE) SARS-CoV-2 target nucleic acids are NOT DETECTED. The SARS-CoV-2 RNA is generally detectable in upper and  lower respiratory specimens during the acute phase of infection. Negative results do not preclude SARS-CoV-2 infection, do not rule out co-infections with other pathogens, and should not be used as the sole basis for treatment or other patient management decisions. Negative results must be combined with clinical observations, patient history, and epidemiological information. The expected result is Negative. Fact Sheet for Patients: SugarRoll.be Fact Sheet for Healthcare Providers: https://www.woods-mathews.com/ This test is not yet approved or cleared by the [place] FDA and  has been authorized for detection and/or diagnosis of SARS-CoV-2 by FDA under an Emergency Use Authorization (EUA). This EUA will remain  in effect (meaning this test can be used) for the duration of the COVID-19 declaration under Section 56 4(b)(1) of the Act, 21 U.S.C. section 360bbb-3(b)(1), unless the authorization is terminated or revoked sooner. Performed at [facility], [place] [address] [place], [place] [address]   MRSA PCR Screening     Status: None   Collection Time: 09/10/19  1:05 PM   Specimen: Nasal Mucosa; Nasopharyngeal  Result Value Ref Range Status   MRSA by PCR NEGATIVE NEGATIVE Final    Comment:        The GeneXpert MRSA Assay (FDA approved for NASAL specimens only), is one component of a comprehensive MRSA colonization surveillance program. It is not intended to diagnose MRSA infection nor to guide or monitor treatment for MRSA infections. Performed at [facility], [place] [address] [place], [place] [address]   Body fluid culture (includes gram stain)     Status: None   Collection Time: 09/12/19  2:54 PM   Specimen: Pleural Fluid  Result Value Ref Range Status   Specimen Description PLEURAL  Final   Special Requests Normal  Final   Gram Stain NO WBC SEEN NO ORGANISMS SEEN   Final   Culture   Final    NO GROWTH 3 DAYS  Performed at [facility], [address] [place], [place] [address]    Report Status 09/15/2019 FINAL  Final  Radiology Studies: CT HEAD WO CONTRAST  Result Date: 09/17/2019 CLINICAL DATA:  Encephalopathy EXAM: CT HEAD WITHOUT CONTRAST TECHNIQUE: Contiguous axial images were obtained from the base of the skull through the vertex without intravenous contrast. COMPARISON:  Correlation made with MRI from 2017 FINDINGS: Brain: There is no acute intracranial hemorrhage, mass-effect, or edema. Gray-white differentiation is preserved. Confluent areas of hypoattenuation in the supratentorial white matter are nonspecific but probably reflect moderate to advanced chronic microvascular ischemic changes. There are small cortical right frontal and parietal cortical infarcts. There is no extra-axial fluid collection. Prominence of ventricles and sulci reflects generalized parenchymal volume loss. Vascular: There is atherosclerotic calcification at the skull base. Skull: Calvarium is unremarkable. Sinuses/Orbits: No acute finding. Other: None. IMPRESSION: No acute intracranial abnormality. Moderate to advanced chronic microvascular ischemic changes. Small chronic right frontoparietal cortical infarcts. Electronically Signed   By: Macy Mis M.D.   On: 09/17/2019 18:11   US Renal  Result Date: 09/17/2019 CLINICAL DATA:  Acute renal failure EXAM: RENAL / URINARY TRACT ULTRASOUND COMPLETE COMPARISON:  CT of the pelvis dated 10/20/2018 FINDINGS: Right Kidney: Renal measurements: 10.6 x 5.4 x 5.3 cm = volume: 159 mL. There is no hydronephrosis. There is increased cortical echogenicity. Left Kidney: Renal measurements: 12.8 x 6.6 x 5.7 cm 252 = volume: 252 mL. There is no hydronephrosis. There is increased cortical echogenicity. Bladder: Appears normal for degree of bladder distention. Both ureteral jets were visualized. Other: None. IMPRESSION: 1. No acute abnormality.  No evidence for hydronephrosis.  2. Echogenic kidneys bilaterally which can be seen in patients with medical renal disease. Electronically Signed   By: Constance Holster M.D.   On: 09/17/2019 19:30   DG CHEST PORT 1 VIEW  Result Date: 09/17/2019 CLINICAL DATA:  In cephalopathy EXAM: PORTABLE CHEST 1 VIEW COMPARISON:  09/12/2019 FINDINGS: Moderate bilateral pleural effusions, increasing on the left since prior study. Bilateral lower lobe airspace opacities. Cardiomegaly, vascular congestion. Multiple old left rib fractures. IMPRESSION: Moderate bilateral pleural effusions with bibasilar atelectasis or infiltrates, worsening on the left since prior study. Cardiomegaly, vascular congestion. Electronically Signed   By: Rolm Baptise M.D.   On: 09/17/2019 20:25        Scheduled Meds: . atorvastatin  20 mg Oral QPM  . budesonide (PULMICORT) nebulizer solution  0.5 mg Nebulization BID  . cyanocobalamin  1,000 mcg Subcutaneous Daily  . docusate sodium  100 mg Oral BID  . donepezil  10 mg Oral QHS  . dorzolamide  1 drop Both Eyes BID  . finasteride  5 mg Oral QPM  . fluticasone  2 spray Each Nare Daily  . insulin aspart  0-15 Units Subcutaneous TID WC  . insulin aspart  0-5 Units Subcutaneous QHS  . insulin aspart  19 Units Subcutaneous Once  . insulin glargine  12 Units Subcutaneous Daily  . ipratropium  0.5 mg Nebulization BID  . levalbuterol  0.63 mg Nebulization BID  . loratadine  10 mg Oral Daily  . methylPREDNISolone (SOLU-MEDROL) injection  80 mg Intravenous Q6H  . multivitamin  1 tablet Oral Daily  . nutrition supplement (JUVEN)  1 packet Oral BID BM  . pantoprazole  40 mg Oral Daily  . Ensure Max Protein  11 oz Oral QHS  . rivaroxaban  15 mg Oral Q supper  . sodium chloride flush  10-40 mL Intracatheter Q12H  . sodium zirconium cyclosilicate  10 g Oral BID  . tamsulosin  0.4 mg Oral Q breakfast   Continuous Infusions: .  sodium chloride 100 mL/hr at 09/17/19 2130  . cefTRIAXone (ROCEPHIN)  IV 1 g (09/17/19 1811)      LOS: 8 days    Time spent: 40 minutes    Irine Seal, MD [facility]  If 7PM-7AM, please contact night-coverage [email or web address] 09/18/2019, 5:29 PM

## 2019-09-18 NOTE — Progress Notes (Signed)
[facility] Progress Note   Subjective:   Arousable today, no complaints.  I/Os 3.2 /1.1.    Objective Vitals:   09/18/19 0624 09/18/19 0720 09/18/19 0815 09/18/19 1223  BP:   133/71 139/74  Pulse:   79 74  Resp:   17 16  Temp:   97.6 F (36.4 C) 97.6 F (36.4 C)  TempSrc:   Oral Oral  SpO2:  95% 92% 97%  Weight: 103.3 kg     Height:       Physical Exam General: obese man lying in bed, no distress Heart: RRR, no rub Lungs: 94% on 2L with clear ant, dec BS bases Abdomen: soft, obese Extremities: L BKA, R with 1+ ankle edema Neuro:  Oriented to self and hospital but sleepy, not really conversant  Additional Objective Labs: Basic Metabolic Panel: Recent Labs  Lab 09/16/19 0124 09/17/19 0533 09/18/19 0756  NA 138 135 137  K 4.7 5.1 5.1  CL 98 99 102  CO2 28 29 24   GLUCOSE 251* 252* 308*  BUN 58* 75* 86*  CREATININE 2.26* 2.67* 2.78*  CALCIUM 8.3* 8.1* 8.2*  PHOS  --   --  5.0*   Liver Function Tests: Recent Labs  Lab 09/18/19 0756  ALBUMIN 2.2*   No results for input(s): LIPASE, AMYLASE in the last 168 hours. CBC: Recent Labs  Lab 09/13/19 0627 09/14/19 0315 09/15/19 0505 09/16/19 0124 09/18/19 0918  WBC 10.7* 8.1 8.6 7.9 6.8  NEUTROABS 7.8* 6.3 7.5  --  6.3  HGB 10.7* 10.4* 10.0* 10.4* 10.9*  HCT 36.7* 36.2* 35.6* 38.5* 38.7*  MCV 86.2 87.9 87.3 89.1 87.4  PLT 218 192 186 190 162   Blood Culture    Component Value Date/Time   SDES PLEURAL 09/12/2019 1454   SPECREQUEST Normal 09/12/2019 1454   CULT  09/12/2019 1454    NO GROWTH 3 DAYS Performed at [facility] [address], [place], [place] [address]    REPTSTATUS 09/15/2019 FINAL 09/12/2019 1454    Cardiac Enzymes: No results for input(s): CKTOTAL, CKMB, CKMBINDEX, TROPONINI in the last 168 hours. CBG: Recent Labs  Lab 09/17/19 1209 09/17/19 1654 09/17/19 2232 09/18/19 0626 09/18/19 1226  GLUCAP 262* 258* 252* 300* 317*   Iron Studies: No results for  input(s): IRON, TIBC, TRANSFERRIN, FERRITIN in the last 72 hours. @lablastinr3 @ Studies/Results: CT HEAD WO CONTRAST  Result Date: 09/17/2019 CLINICAL DATA:  Encephalopathy EXAM: CT HEAD WITHOUT CONTRAST TECHNIQUE: Contiguous axial images were obtained from the base of the skull through the vertex without intravenous contrast. COMPARISON:  Correlation made with MRI from 2017 FINDINGS: Brain: There is no acute intracranial hemorrhage, mass-effect, or edema. Gray-white differentiation is preserved. Confluent areas of hypoattenuation in the supratentorial white matter are nonspecific but probably reflect moderate to advanced chronic microvascular ischemic changes. There are small cortical right frontal and parietal cortical infarcts. There is no extra-axial fluid collection. Prominence of ventricles and sulci reflects generalized parenchymal volume loss. Vascular: There is atherosclerotic calcification at the skull base. Skull: Calvarium is unremarkable. Sinuses/Orbits: No acute finding. Other: None. IMPRESSION: No acute intracranial abnormality. Moderate to advanced chronic microvascular ischemic changes. Small chronic right frontoparietal cortical infarcts. Electronically Signed   By: Macy Mis M.D.   On: 09/17/2019 18:11   US Renal  Result Date: 09/17/2019 CLINICAL DATA:  Acute renal failure EXAM: RENAL / URINARY TRACT ULTRASOUND COMPLETE COMPARISON:  CT of the pelvis dated 10/20/2018 FINDINGS: Right Kidney: Renal measurements: 10.6 x 5.4 x 5.3  cm = volume: 159 mL. There is no hydronephrosis. There is increased cortical echogenicity. Left Kidney: Renal measurements: 12.8 x 6.6 x 5.7 cm 252 = volume: 252 mL. There is no hydronephrosis. There is increased cortical echogenicity. Bladder: Appears normal for degree of bladder distention. Both ureteral jets were visualized. Other: None. IMPRESSION: 1. No acute abnormality.  No evidence for hydronephrosis. 2. Echogenic kidneys bilaterally which can be seen in  patients with medical renal disease. Electronically Signed   By: Constance Holster M.D.   On: 09/17/2019 19:30   DG CHEST PORT 1 VIEW  Result Date: 09/17/2019 CLINICAL DATA:  In cephalopathy EXAM: PORTABLE CHEST 1 VIEW COMPARISON:  09/12/2019 FINDINGS: Moderate bilateral pleural effusions, increasing on the left since prior study. Bilateral lower lobe airspace opacities. Cardiomegaly, vascular congestion. Multiple old left rib fractures. IMPRESSION: Moderate bilateral pleural effusions with bibasilar atelectasis or infiltrates, worsening on the left since prior study. Cardiomegaly, vascular congestion. Electronically Signed   By: Rolm Baptise M.D.   On: 09/17/2019 20:25   Medications: . sodium chloride 100 mL/hr at 09/17/19 2130  . cefTRIAXone (ROCEPHIN)  IV 1 g (09/17/19 1811)   . atorvastatin  20 mg Oral QPM  . budesonide (PULMICORT) nebulizer solution  0.5 mg Nebulization BID  . cyanocobalamin  1,000 mcg Subcutaneous Daily  . docusate sodium  100 mg Oral BID  . donepezil  10 mg Oral QHS  . dorzolamide  1 drop Both Eyes BID  . finasteride  5 mg Oral QPM  . fluticasone  2 spray Each Nare Daily  . insulin aspart  0-15 Units Subcutaneous TID WC  . insulin aspart  0-5 Units Subcutaneous QHS  . insulin glargine  12 Units Subcutaneous Daily  . ipratropium  0.5 mg Nebulization BID  . levalbuterol  0.63 mg Nebulization BID  . loratadine  10 mg Oral Daily  . methylPREDNISolone (SOLU-MEDROL) injection  80 mg Intravenous Q6H  . multivitamin  1 tablet Oral Daily  . nutrition supplement (JUVEN)  1 packet Oral BID BM  . pantoprazole  40 mg Oral Daily  . Ensure Max Protein  11 oz Oral QHS  . rivaroxaban  15 mg Oral Q supper  . sodium chloride flush  10-40 mL Intracatheter Q12H  . tamsulosin  0.4 mg Oral Q breakfast   Darrell Mcclain is an 79 y.o. male with DM, prostate Ca, dementia, HTN, HL, BPH, chronic HFpEF, A flutter who is seen for eval and management of AKI on CKD 3 in setting of foot  osteomyelitis now s/p amputation  Assessment/Plan: **AKI on CKD 3:  Nonoliguric. Pt with gradually worsening cr since admission which is suspect is secondary to tubular injury from hemodynamics with modest hypotension + mild hypovolemia with low urine sodium.   Imaging r/o obstruction - echogenic kidneys. UA not consistent with GN though could be concerning for AIN with leukocytes present; he has no fevers, rashes otherwise suggesting AIN so doubt.    Rate of rise of creatinine rise has declined today.  No indications for RRT and hopefully with supportive care he will improve.  IVF to maintain euvolemia  -- appears euvolemic today but in light of dec mental status he's on MIVF 100/hr.  Low threshold to d/c if volume up.  Avoid NSAIDs and other nephrotoxins, follow strict I/Os, daily weight, daily BMP for now.  Continue to hold home ACEi and diuretic.  **Possible UTI: culture pending, on CTX.  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  Off antibiotics now.  Has wound vac.  Ortho following.   **Chronic hypoxic resp failure:  Has known chronic R pleural effusion. On home O2.    **Chronic HFrEF: does not appear to have acute exacerbation.  Judicious fluids PRN for hypovolemia (on MIVF today); can use diuretics to maintain volume status as needed.   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 Was more altered yesterday and primary pursuing additonal w/u - ct head neg acute change.  DNR  Darrell Hick MD 09/18/2019, 1:29 PM  [facility] Pager: [phone]

## 2019-09-18 NOTE — Progress Notes (Signed)
RT obtained ABG on pt with the following results. Pt ABG obtained on 5Lpm [place]. RT will continue to monitor.   Results for Darrell Mcclain, Darrell Mcclain (MRN [id number]) as of 09/18/2019 05:03  Ref. Range 09/18/2019 04:37  Sample type Unknown ARTERIAL  FIO2 Unknown 40.00  pH, Arterial Latest Ref Range: 7.350 - 7.450  7.269 (L)  pCO2 arterial Latest Ref Range: 32.0 - 48.0 mmHg 58.4 (H)  pO2, Arterial Latest Ref Range: 83.0 - 108.0 mmHg 85.7  Acid-base deficit Latest Ref Range: 0.0 - 2.0 mmol/L 0.3  Bicarbonate Latest Ref Range: 20.0 - 28.0 mmol/L 25.7  O2 Saturation Latest Units: % 96.2  Patient temperature Unknown 37.3  Collection site Unknown RIGHT RADIAL  Allens test (pass/fail) Latest Ref Range: PASS  PASS

## 2019-09-18 NOTE — Progress Notes (Signed)
ANTICOAGULATION CONSULT NOTE - Initial Consult  Pharmacy Consult for Xarelto Indication: atrial fibrillation  Allergies  Allergen Reactions  . Ciprofloxacin Diarrhea  . Neurontin [Gabapentin]     Hallucinations/altered mental status  . Oxycodone Other (See Comments)    Altered mental status    Patient Measurements: Height: 5\' 7"  (170.2 cm) Weight: 227 lb 11.8 oz (103.3 kg) IBW/kg (Calculated) : 66.1  Vital Signs: Temp: 97.6 F (36.4 C) (01/10 0815) Temp Source: Oral (01/10 0815) BP: 133/71 (01/10 0815) Pulse Rate: 79 (01/10 0815)  Labs: Recent Labs    09/16/19 0124 09/17/19 0533 09/18/19 0756 09/18/19 0918  HGB 10.4*  --   --  10.9*  HCT 38.5*  --   --  38.7*  PLT 190  --   --  162  CREATININE 2.26* 2.67* 2.78*  --     Estimated Creatinine Clearance: 25.9 mL/min (A) (by C-G formula based on SCr of 2.78 mg/dL (H)).   Assessment: 79 yo M with atrial flutter on Xarelto 20 mg daily PTA presented with left heel osteomyelitis now s/p amputation on 1/6. Xarelto was held for surgery and last dose was on 09/10/19. Pharmacy asked to restart Xarelto.  CBC has been stable with no overt bleeding noted. Worsened renal function since admission (Scr 2.78 today from 1.2 on admission) with CrCl 26 ml/min. Given renal function, will restart Xarelto at lower dose.  Goal of Therapy:  Monitor platelets by anticoagulation protocol: Yes   Plan:  - Start Xarelto 15 mg PO daily with meals - Monitor CBC Q3 days - Monitor for bleeding  Richardine Service, PharmD PGY1 Pharmacy Resident Phone: [phone] 09/18/2019  10:08 AM  Please check [email or web address] for unit-specific pharmacy phone numbers.

## 2019-09-19 LAB — RENAL FUNCTION PANEL
Albumin: 2.4 g/dL — ABNORMAL LOW (ref 3.5–5.0)
Anion gap: 11 (ref 5–15)
BUN: 90 mg/dL — ABNORMAL HIGH (ref 8–23)
CO2: 24 mmol/L (ref 22–32)
Calcium: 8.3 mg/dL — ABNORMAL LOW (ref 8.9–10.3)
Chloride: 102 mmol/L (ref 98–111)
Creatinine, Ser: 2.72 mg/dL — ABNORMAL HIGH (ref 0.61–1.24)
GFR calc Af Amer: 25 mL/min — ABNORMAL LOW (ref >60)
GFR calc non Af Amer: 22 mL/min — ABNORMAL LOW (ref >60)
Glucose, Bld: 342 mg/dL — ABNORMAL HIGH (ref 70–99)
Phosphorus: 4.5 mg/dL (ref 2.5–4.6)
Potassium: 4.5 mmol/L (ref 3.5–5.1)
Sodium: 137 mmol/L (ref 135–145)

## 2019-09-19 LAB — GLUCOSE, CAPILLARY
Glucose-Capillary: 325 mg/dL — ABNORMAL HIGH (ref 70–99)
Glucose-Capillary: 352 mg/dL — ABNORMAL HIGH (ref 70–99)
Glucose-Capillary: 364 mg/dL — ABNORMAL HIGH (ref 70–99)
Glucose-Capillary: 276 mg/dL — ABNORMAL HIGH (ref 70–99)

## 2019-09-19 LAB — CBC
HCT: 39.6 % (ref 39.0–52.0)
Hemoglobin: 11.3 g/dL — ABNORMAL LOW (ref 13.0–17.0)
MCH: 24.6 pg — ABNORMAL LOW (ref 26.0–34.0)
MCHC: 28.5 g/dL — ABNORMAL LOW (ref 30.0–36.0)
MCV: 86.3 fL (ref 80.0–100.0)
Platelets: 164 10*3/uL (ref 150–400)
RBC: 4.59 MIL/uL (ref 4.22–5.81)
RDW: 17.3 % — ABNORMAL HIGH (ref 11.5–15.5)
WBC: 7.3 10*3/uL (ref 4.0–10.5)
nRBC: 0 % (ref 0.0–0.2)

## 2019-09-19 LAB — AMMONIA: Ammonia: 20 umol/L (ref 9–35)

## 2019-09-19 MED ORDER — INSULIN GLARGINE 100 UNIT/ML ~~LOC~~ SOLN
15.0000 [IU] | Freq: Every day | SUBCUTANEOUS | Status: DC
  Administered 2019-09-19: 15 [IU] via SUBCUTANEOUS
  Filled 2019-09-19: qty 0.15

## 2019-09-19 MED ORDER — METHYLPREDNISOLONE SODIUM SUCC 125 MG IJ SOLR
80.0000 mg | Freq: Three times a day (TID) | INTRAMUSCULAR | Status: DC
  Administered 2019-09-19 (×2): 80 mg via INTRAVENOUS
  Filled 2019-09-19 (×2): qty 2

## 2019-09-19 MED ORDER — INSULIN GLARGINE 100 UNIT/ML ~~LOC~~ SOLN
5.0000 [IU] | Freq: Once | SUBCUTANEOUS | Status: AC
  Administered 2019-09-19: 5 [IU] via SUBCUTANEOUS
  Filled 2019-09-19: qty 0.05

## 2019-09-19 MED ORDER — INSULIN GLARGINE 100 UNIT/ML ~~LOC~~ SOLN
20.0000 [IU] | Freq: Every day | SUBCUTANEOUS | Status: DC
  Filled 2019-09-19: qty 0.2

## 2019-09-19 NOTE — Progress Notes (Addendum)
PROGRESS NOTE    Darrell Mcclain  GYI:[id number] DOB: 1941-06-30 DOA: 09/09/2019 PCP: Burnard Bunting, MD    Brief Narrative:  79 year old male with history of diabetes mellitus type II, prostate cancer, dementia, hypertension, hyperlipidemia, unspecified CVA, BPH with history of chronic indwelling Foley (not currently present), chronic diastolic CHF, chronic kidney disease stage III, chronic respiratory failure on home oxygen, atrial flutter on Xarelto presented with foot infection.  MRI of the left foot showed osteomyelitis.  He was started on broad-spectrum antibiotics.  Orthopedics was consulted.    Assessment & Plan:   Principal Problem:   Osteomyelitis (Darrell Mcclain) Active Problems:   Atrial flutter (HCC)   Chronic diastolic CHF (congestive heart failure), NYHA class 2 (HCC)   Memory loss   Uncontrolled diabetes mellitus (Darrell Mcclain)   Morbid obesity (HCC)   Hyperlipidemia   Essential hypertension   Malignant neoplasm of prostate (HCC)   Stage 3a chronic kidney disease   Diabetic foot infection (Darrell Mcclain)   Moderate protein-calorie malnutrition (Darrell Mcclain)   Goals of care, counseling/discussion   Palliative care encounter   Pleural effusion   Chronic respiratory failure with hypoxia (HCC)   Vascular dementia without behavioral disturbance (HCC)   Hypokalemia   Acute renal failure (HCC)   Encephalopathy acute   Hypercarbia   Hyperkalemia   Acute lower UTI  1 gangrenous left heel ulcer with acute osteomyelitis MRI of the left heel done consistent with left heel osteomyelitis.  ABIs were done.  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 [name].  IV antibiotics have been discontinued. Continue pain management.  Patient with wound VAC on and management per orthopedics.  2.  Chronic hypoxic respiratory failure on 2 L home O2/loculated chronic right sided pleural effusion CT chest which was done showed increased size of left pleural effusion,  moderate now, persistent stable right pleural effusion and suspected atelectasis in the right lung base also with stable mediastinal lymphadenopathy.  Chest x-ray done on admission showed slight increase in right loculated moderate pleural effusion.  Pulmonary was consulted and patient subsequently underwent ultrasound-guided thoracentesis of the right with fluid analysis consistent with exudative, lymphocytic predominant with 1100 nucleated cells with lymphocytosis but no malignancy identified.  Per pulmonary likely related to chronic medical conditions and suspected will reaccumulate like he did in the past.  Per pulmonary no further recommendations at this time.  Follow.  3.  Chronic diastolic heart failure/paroxysmal atrial flutter 2D echo from 07/01/2019 with a EF of 50 to 55% with moderate TR and severely elevated pulmonary artery pressure.  Patient with a urine output of 1.1 L over the past 24 hours.  Patient with sats of 100% on 4 L nasal cannula.  Blood pressure improved.  Lasix and lisinopril have been held due to worsening renal function..  Norvasc and Toprol-XL have been discontinued.  Continue Lipitor.  Saline lock IV fluids.  Discussed with Dr. [name] orthopedics and patient Xarelto was resumed and renally dosed per pharmacy.  Follow.  4.  Moderate protein calorie malnutrition  5.  Diabetes mellitus type 2 Hemoglobin A1c 6.8.  Lantus was discontinued as patient noted to have a hypoglycemic episode the morning of 09/13/2019.  CBG of 325 this morning.  Increase Lantus to 20 units daily.  Sliding scale insulin.   6.  Acute on chronic kidney disease stage III Creatinine trending up.  Patient noted to have borderline blood pressure.  Diuretic and ACE inhibitor have been discontinued.  Likely secondary to a prerenal azotemia.  Urinalysis with large leukocytes, 21-50 WBCs.  Urine sodium less than 10.  Renal ultrasound negative for hydronephrosis.  Continue to hold oral antihypertensives.  Vancomycin  was discontinued with last dose on 09/14/2019.  Patient started to look volume overloaded.  Saline lock IV fluids.  Avoid nephrotoxins.  May need IV Lasix however will defer to nephrology. Nephrology following and I appreciate the input and recommendations.  7.  Hypokalemia/hyperkalemia Repeat potassium is 6.0 on 09/18/2019.  Patient given 2 doses of Lokelma 10 mg twice daily.  EKG with no T wave abnormalities.  Hyperkalemia resolved potassium currently of 4.5.  Patient on sliding scale insulin.  Follow.  8.  Anemia of chronic disease H&H stable at 11.3.  Follow.  9.  Hyperlipidemia Continue statin.  10.  Hypertension Blood pressure improved with hydration.  Patient starting to look volume overloaded.  Saline lock IV fluids.  Continue to hold antihypertensive medications.  Follow.   11.  History of prostate cancer Status post treatment in 2018  12.  Vascular dementia/acute metabolic encephalopathy secondary to hypercarbia Patient drowsy on 09/17/2019.  Clinically improved and more alert today.  Head CT done was negative for any acute abnormalities.  TSH within normal limits.  Ammonia level was elevated at 37 repeat ammonia levels noted at 57 and now back down to 20 after lactulose..  Vitamin B12 445.  TSH within normal limits at 1.289.  RBC folate pending.  Neurontin has been discontinued.  ABG obtained concerning for respiratory acidosis and as such patient started on IV Solu-Medrol, Pulmicort, Xopenex and Atrovent nebs, Claritin, PPI, IV Rocephin.  Continue Aricept.  Lactulose will be discontinued today.  Continue vitamin B12 supplementation.  Saline lock IV fluids.  Fall precautions.  Palliative care consulted.   13.  Hyperkalemia Likely secondary to worsening renal function.  Lokelma 10 mg p.o. twice daily 09/18/2019.  Repeat potassium at 4.5 this morning.  Follow labs.  14.  Probable UTI Urine cultures pending.  IV Rocephin.   DVT prophylaxis: Heparin>> Xarelto Code Status: DNR Family  Communication: Updated patient.  No family at bedside.  Disposition Plan: Transfer to telemetry.  CIR versus SNF   Consultants:   Orthopedics: Dr.[name] 09/10/2019  Orthopedics: Dr. Lyla Glassing 09/10/2019  Wound care  Rib/PCCM: Dr. Valeta Harms 09/12/2019  Palliative care: Vinie Sill, NP/Dr. Rowe Pavy 09/13/2019  Inpatient rehab  Nephrology: Dr. Johnney Ou 09/17/2019  Procedures:   Transtibial amputation left lower extremity per Dr. [name] 09/14/2019  CT chest 09/12/2019  Chest x-ray 09/10/2019, 09/12/2019  Plain films of the orbits 09/10/2019  Plain films of the left foot 09/09/2019  MRI right heel 09/10/2019  MRI left heel 09/10/2019  ABIs with/without TBI 09/11/2019  Ultrasound-guided thoracentesis per Dr. Tamala Julian, pulmonary 09/12/2019  CT head 09/17/2019  Renal ultrasound 09/17/2019  Antimicrobials:   IV vancomycin 09/10/2019>>>> 09/14/2019  IV Flagyl 09/10/2019>>>>>> 09/16/2019  IV Rocephin 09/10/2019>>>>> 09/16/2019  IV Zosyn x1 dose 09/10/2019  IV Rocephin 09/17/2019   Subjective: Patient alert.  Denies any chest pain.  No shortness of breath.   Objective: Vitals:   09/19/19 0048 09/19/19 0512 09/19/19 0845 09/19/19 0908  BP: (!) 146/82 139/77 134/68 134/68  Pulse: 81 80 84 94  Resp: 13 11 15 18   Temp: 98 F (36.7 C) 98.6 F (37 C)  (!) 97.2 F (36.2 C)  TempSrc: Oral Oral  Oral  SpO2: 100% 100% 97% 100%  Weight:  107.4 kg    Height:        Intake/Output Summary (Last 24 hours) at 09/19/2019  [place] filed at 09/19/2019 0500 Gross per 24 hour  Intake --  Output 800 ml  Net -800 ml   Filed Weights   09/17/19 2050 09/18/19 0624 09/19/19 0512  Weight: 102.6 kg 103.3 kg 107.4 kg    Examination:  General exam: NAD.  Dry mucous membranes. Respiratory system: CTAB.  No wheezes, no crackles, no rhonchi.  Normal respiratory effort. Cardiovascular system: Regular rate and rhythm no murmurs rubs or gallops.  No JVD. 3 + RLE edema.  Gastrointestinal system: Abdomen is soft, nontender,  nondistended, positive bowel sounds.  No rebound.  No guarding.  Central nervous system: Alert and oriented. No focal neurological deficits. Extremities: s/p left transtibial amputation and shrinker with wound VAC.  Skin: No rashes, lesions or ulcers Psychiatry: Judgement and insight appear fair. Mood & affect appropriate.     Data Reviewed: I have personally reviewed following labs and imaging studies  CBC: Recent Labs  Lab 09/13/19 0627 09/14/19 0315 09/15/19 0505 09/16/19 0124 09/17/19 1657 09/18/19 0918 09/19/19 0608  WBC 10.7* 8.1 8.6 7.9  --  6.8 7.3  NEUTROABS 7.8* 6.3 7.5  --   --  6.3  --   HGB 10.7* 10.4* 10.0* 10.4*  --  10.9* 11.3*  HCT 36.7* 36.2* 35.6* 38.5* 33.6* 38.7* 39.6  MCV 86.2 87.9 87.3 89.1  --  87.4 86.3  PLT 218 192 186 190  --  162 124   Basic Metabolic Panel: Recent Labs  Lab 09/13/19 0627 09/14/19 0315 09/16/19 0124 09/17/19 0533 09/18/19 0756 09/18/19 1544 09/18/19 2200 09/19/19 0608  NA 142 140 138 135 137 134*  --  137  K 3.4* 4.0 4.7 5.1 5.1 6.0* 4.8 4.5  CL 101 100 98 99 102 102  --  102  CO2 31 32 28 29 24 25   --  24  GLUCOSE 26* 129* 251* 252* 308* 426*  --  342*  BUN 36* 39* 58* 75* 86* 89*  --  90*  CREATININE 1.41* 1.51* 2.26* 2.67* 2.78* 2.74*  --  2.72*  CALCIUM 8.2* 8.2* 8.3* 8.1* 8.2* 7.9*  --  8.3*  MG 1.9 1.9  --   --   --   --   --   --   PHOS  --   --   --   --  5.0* 4.7*  --  4.5   GFR: Estimated Creatinine Clearance: 27 mL/min (A) (by C-G formula based on SCr of 2.72 mg/dL (H)). Liver Function Tests: Recent Labs  Lab 09/18/19 0756 09/18/19 1544 09/19/19 0608  ALBUMIN 2.2* 2.2* 2.4*   No results for input(s): LIPASE, AMYLASE in the last 168 hours. Recent Labs  Lab 09/17/19 1657 09/18/19 0918 09/19/19 0608  AMMONIA 37* 57* 20   Coagulation Profile: No results for input(s): INR, PROTIME in the last 168 hours. Cardiac Enzymes: No results for input(s): CKTOTAL, CKMB, CKMBINDEX, TROPONINI in the last 168  hours. BNP (last 3 results) No results for input(s): PROBNP in the last 8760 hours. HbA1C: No results for input(s): HGBA1C in the last 72 hours. CBG: Recent Labs  Lab 09/18/19 0626 09/18/19 1226 09/18/19 1647 09/18/19 2151 09/19/19 0635  GLUCAP 300* 317* 415* 401* 325*   Lipid Profile: No results for input(s): CHOL, HDL, LDLCALC, TRIG, CHOLHDL, LDLDIRECT in the last 72 hours. Thyroid Function Tests: Recent Labs    09/17/19 1657  TSH 1.289   Anemia Panel: Recent Labs    09/17/19 1657  VITAMINB12 445   Sepsis Labs: No  results for input(s): PROCALCITON, LATICACIDVEN in the last 168 hours.  Recent Results (from the past 240 hour(s))  Blood culture (routine x 2)     Status: None   Collection Time: 09/09/19  3:48 PM   Specimen: BLOOD  Result Value Ref Range Status   Specimen Description BLOOD RIGHT ANTECUBITAL  Final   Special Requests   Final    BOTTLES DRAWN AEROBIC AND ANAEROBIC Blood Culture adequate volume   Culture   Final    NO GROWTH 5 DAYS Performed at [facility], [address], [place], [place] [address]    Report Status 09/14/2019 FINAL  Final  Blood culture (routine x 2)     Status: None   Collection Time: 09/09/19  4:37 PM   Specimen: BLOOD  Result Value Ref Range Status   Specimen Description BLOOD LEFT ANTECUBITAL  Final   Special Requests   Final    BOTTLES DRAWN AEROBIC AND ANAEROBIC Blood Culture results may not be optimal due to an inadequate volume of blood received in culture bottles   Culture   Final    NO GROWTH 5 DAYS Performed at [facility], [place] [address] [place], [place] [address]    Report Status 09/14/2019 FINAL  Final  SARS CORONAVIRUS 2 (TAT 6-24 HRS) Nasopharyngeal Nasopharyngeal Swab     Status: None   Collection Time: 09/10/19  3:55 AM   Specimen: Nasopharyngeal Swab  Result Value Ref Range Status   SARS Coronavirus 2 NEGATIVE NEGATIVE Final    Comment: (NOTE) SARS-CoV-2 target nucleic acids are NOT  DETECTED. The SARS-CoV-2 RNA is generally detectable in upper and lower respiratory specimens during the acute phase of infection. Negative results do not preclude SARS-CoV-2 infection, do not rule out co-infections with other pathogens, and should not be used as the sole basis for treatment or other patient management decisions. Negative results must be combined with clinical observations, patient history, and epidemiological information. The expected result is Negative. Fact Sheet for Patients: SugarRoll.be Fact Sheet for Healthcare Providers: https://www.woods-mathews.com/ This test is not yet approved or cleared by the [place] FDA and  has been authorized for detection and/or diagnosis of SARS-CoV-2 by FDA under an Emergency Use Authorization (EUA). This EUA will remain  in effect (meaning this test can be used) for the duration of the COVID-19 declaration under Section 56 4(b)(1) of the Act, 21 U.S.C. section 360bbb-3(b)(1), unless the authorization is terminated or revoked sooner. Performed at [facility], [place] [address] [place], [place] [address]   MRSA PCR Screening     Status: None   Collection Time: 09/10/19  1:05 PM   Specimen: Nasal Mucosa; Nasopharyngeal  Result Value Ref Range Status   MRSA by PCR NEGATIVE NEGATIVE Final    Comment:        The GeneXpert MRSA Assay (FDA approved for NASAL specimens only), is one component of a comprehensive MRSA colonization surveillance program. It is not intended to diagnose MRSA infection nor to guide or monitor treatment for MRSA infections. Performed at [facility], [place] [address], [place], [place] [address]   Body fluid culture (includes gram stain)     Status: None   Collection Time: 09/12/19  2:54 PM   Specimen: Pleural Fluid  Result Value Ref Range Status   Specimen Description PLEURAL  Final   Special Requests Normal  Final   Gram Stain NO WBC SEEN NO  ORGANISMS SEEN   Final   Culture  Final    NO GROWTH 3 DAYS Performed at [facility], [place] [address], [place], [place] [address]    Report Status 09/15/2019 FINAL  Final         Radiology Studies: CT HEAD WO CONTRAST  Result Date: 09/17/2019 CLINICAL DATA:  Encephalopathy EXAM: CT HEAD WITHOUT CONTRAST TECHNIQUE: Contiguous axial images were obtained from the base of the skull through the vertex without intravenous contrast. COMPARISON:  Correlation made with MRI from 2017 FINDINGS: Brain: There is no acute intracranial hemorrhage, mass-effect, or edema. Gray-white differentiation is preserved. Confluent areas of hypoattenuation in the supratentorial white matter are nonspecific but probably reflect moderate to advanced chronic microvascular ischemic changes. There are small cortical right frontal and parietal cortical infarcts. There is no extra-axial fluid collection. Prominence of ventricles and sulci reflects generalized parenchymal volume loss. Vascular: There is atherosclerotic calcification at the skull base. Skull: Calvarium is unremarkable. Sinuses/Orbits: No acute finding. Other: None. IMPRESSION: No acute intracranial abnormality. Moderate to advanced chronic microvascular ischemic changes. Small chronic right frontoparietal cortical infarcts. Electronically Signed   By: Macy Mis M.D.   On: 09/17/2019 18:11   US Renal  Result Date: 09/17/2019 CLINICAL DATA:  Acute renal failure EXAM: RENAL / URINARY TRACT ULTRASOUND COMPLETE COMPARISON:  CT of the pelvis dated 10/20/2018 FINDINGS: Right Kidney: Renal measurements: 10.6 x 5.4 x 5.3 cm = volume: 159 mL. There is no hydronephrosis. There is increased cortical echogenicity. Left Kidney: Renal measurements: 12.8 x 6.6 x 5.7 cm 252 = volume: 252 mL. There is no hydronephrosis. There is increased cortical echogenicity. Bladder: Appears normal for degree of bladder distention. Both ureteral jets were visualized. Other: None.  IMPRESSION: 1. No acute abnormality.  No evidence for hydronephrosis. 2. Echogenic kidneys bilaterally which can be seen in patients with medical renal disease. Electronically Signed   By: Constance Holster M.D.   On: 09/17/2019 19:30   DG CHEST PORT 1 VIEW  Result Date: 09/17/2019 CLINICAL DATA:  In cephalopathy EXAM: PORTABLE CHEST 1 VIEW COMPARISON:  09/12/2019 FINDINGS: Moderate bilateral pleural effusions, increasing on the left since prior study. Bilateral lower lobe airspace opacities. Cardiomegaly, vascular congestion. Multiple old left rib fractures. IMPRESSION: Moderate bilateral pleural effusions with bibasilar atelectasis or infiltrates, worsening on the left since prior study. Cardiomegaly, vascular congestion. Electronically Signed   By: Rolm Baptise M.D.   On: 09/17/2019 20:25        Scheduled Meds: . atorvastatin  20 mg Oral QPM  . budesonide (PULMICORT) nebulizer solution  0.5 mg Nebulization BID  . cyanocobalamin  1,000 mcg Subcutaneous Daily  . docusate sodium  100 mg Oral BID  . donepezil  10 mg Oral QHS  . dorzolamide  1 drop Both Eyes BID  . finasteride  5 mg Oral QPM  . fluticasone  2 spray Each Nare Daily  . insulin aspart  0-15 Units Subcutaneous TID WC  . insulin aspart  0-5 Units Subcutaneous QHS  . insulin glargine  15 Units Subcutaneous Daily  . ipratropium  0.5 mg Nebulization BID  . lactulose  20 g Oral BID  . levalbuterol  0.63 mg Nebulization BID  . loratadine  10 mg Oral Daily  . methylPREDNISolone (SOLU-MEDROL) injection  80 mg Intravenous Q8H  . multivitamin  1 tablet Oral Daily  . nutrition supplement (JUVEN)  1 packet Oral BID BM  . pantoprazole  40 mg Oral Daily  . Ensure Max Protein  11 oz Oral QHS  . rivaroxaban  15  mg Oral Q supper  . sodium chloride flush  10-40 mL Intracatheter Q12H  . tamsulosin  0.4 mg Oral Q breakfast   Continuous Infusions: . sodium chloride 100 mL/hr at 09/19/19 0405  . cefTRIAXone (ROCEPHIN)  IV 1 g (09/18/19  1746)     LOS: 9 days    Time spent: 40 minutes    Irine Seal, MD [facility]  If 7PM-7AM, please contact night-coverage [email or web address] 09/19/2019, 10:49 AM

## 2019-09-19 NOTE — Progress Notes (Signed)
Daily Progress Note   Patient Name: Darrell Mcclain       Date: 09/19/2019 DOB: 1940-12-31  Age: 79 y.o. MRN#: [id number] Attending Physician: Eugenie Filler, MD Primary Care Physician: Burnard Bunting, MD Admit Date: 09/09/2019  Reason for Consultation/Follow-up: Establishing goals of care  Subjective: Patient reports feeling better.  He is awake and alert and tells me he and his wife are going to sell their home and move into assisted living.  He talked for a while about disliking being dependent on others to care for him.   He denies pain, SOB, constipation at this time.   Assessment: Gentleman with acute on chronic kidney failure after lower extremity amputation for osteomyelitis.  Became acidotic but now appears to be stabilizing.   Patient Profile/HPI:  79 y.o. male  with past medical history of dementia, history of CVA, prostate cancer, diastolic CHF, atrial flutter on Xarelto, hypertension, hyperlipidemia, CKD stage 3, diabetes, BPH with indwelling catheter admitted from [facility] on 09/09/2019 with foot infection with L osteomyelitis of heel and superficial ulcer on R heel. Most likely to require left transtibial amputation.   Length of Stay: 9  Current Medications: Scheduled Meds:  . atorvastatin  20 mg Oral QPM  . budesonide (PULMICORT) nebulizer solution  0.5 mg Nebulization BID  . cyanocobalamin  1,000 mcg Subcutaneous Daily  . docusate sodium  100 mg Oral BID  . donepezil  10 mg Oral QHS  . dorzolamide  1 drop Both Eyes BID  . finasteride  5 mg Oral QPM  . fluticasone  2 spray Each Nare Daily  . insulin aspart  0-15 Units Subcutaneous TID WC  . insulin aspart  0-5 Units Subcutaneous QHS  . [START ON 09/20/2019] insulin glargine  20 Units Subcutaneous Daily  .  insulin glargine  5 Units Subcutaneous Once  . ipratropium  0.5 mg Nebulization BID  . lactulose  20 g Oral BID  . levalbuterol  0.63 mg Nebulization BID  . loratadine  10 mg Oral Daily  . methylPREDNISolone (SOLU-MEDROL) injection  80 mg Intravenous Q8H  . multivitamin  1 tablet Oral Daily  . nutrition supplement (JUVEN)  1 packet Oral BID BM  . pantoprazole  40 mg Oral Daily  . Ensure Max Protein  11 oz Oral QHS  . rivaroxaban  15 mg Oral  Q supper  . sodium chloride flush  10-40 mL Intracatheter Q12H  . tamsulosin  0.4 mg Oral Q breakfast    Continuous Infusions: . cefTRIAXone (ROCEPHIN)  IV 1 g (09/18/19 1746)    PRN Meds: acetaminophen **OR** acetaminophen, acetaminophen, HYDROcodone-acetaminophen, metoCLOPramide **OR** metoCLOPramide (REGLAN) injection, morphine injection, ondansetron **OR** ondansetron (ZOFRAN) IV, ondansetron **OR** ondansetron (ZOFRAN) IV, sodium chloride flush  Physical Exam       Chronically ill appearing male, flushed in the face.  Awake alert appropriate. CV rrr resp no distress on 4L  Vital Signs: BP 134/68 (BP Location: Right Arm)   Pulse 94   Temp (!) 97.2 F (36.2 C) (Oral)   Resp 18   Ht 5\' 7"  (1.702 m)   Wt 107.4 kg   SpO2 100%   BMI 37.08 kg/m  SpO2: SpO2: 100 % O2 Device: O2 Device: Nasal Cannula O2 Flow Rate: O2 Flow Rate (L/min): 4 L/min  Intake/output summary:   Intake/Output Summary (Last 24 hours) at 09/19/2019 1606 Last data filed at 09/19/2019 0500 Gross per 24 hour  Intake --  Output 800 ml  Net -800 ml   LBM: Last BM Date: 09/18/19 Baseline Weight: Weight: 95 kg Most recent weight: Weight: 107.4 kg       Palliative Assessment/Data: 30%      Patient Active Problem List   Diagnosis Date Noted  . Acute renal failure ([place])   . Encephalopathy acute   . Hypercarbia   . Hyperkalemia   . Acute lower UTI   . Pleural effusion   . Chronic respiratory failure with hypoxia ([place])   . Vascular dementia without  behavioral disturbance ([place])   . Hypokalemia   . Goals of care, counseling/discussion   . Palliative care encounter   . Osteomyelitis ([place]) 09/10/2019  . Stage 3a chronic kidney disease 09/10/2019  . Diabetic foot infection ([place])   . Moderate protein-calorie malnutrition ([place])   . Malignant neoplasm of prostate ([place]) 09/10/2016  . Falls 11/21/2015  . Memory loss 11/21/2015  . Uncontrolled diabetes mellitus ([place]) 11/21/2015  . Morbid obesity ([place]) 11/21/2015  . Hyperlipidemia 11/21/2015  . Essential hypertension 11/21/2015  . 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 Chronic diastolic CHF (congestive heart failure), NYHA class 2 ([place]) 02/28/2014  . Atrial flutter ([place]) 10/13/2013    Palliative Care Plan    Recommendations/Plan:  DNR  Continue current care  Palliative to follow outpatient.  PMT will chart check for decline and need to re-engage.  Goals of Care and Additional Recommendations:  Limitations on Scope of Treatment: Full Scope Treatment  Code Status:  DNR  Prognosis:   Unable to determine   Care plan was discussed with PMT team.  Thank you for allowing the Palliative Medicine Team to assist in the care of this patient.  Total time spent:  15 min.     Greater than 50%  of this time was spent counseling and coordinating care related to the above assessment and plan.  Florentina Jenny, PA-C Palliative Medicine  Please contact Palliative MedicineTeam phone at [phone] for questions and concerns between 7 am - 7 pm.   Please see AMION for individual provider pager numbers.

## 2019-09-19 NOTE — Progress Notes (Signed)
Daughter [name] lives out of town. Darrell Mcclain is the caretaker the patient has had caring for him at home prior to his hospitalization.  The daughter requested EDIe be added as the designated visitor so she could help with his care.

## 2019-09-19 NOTE — Progress Notes (Signed)
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  He participates well in therex in bed, but has limited follow through to truly move himself due to deconditioning and decreased cognition.  Feel he remains appropriate for SNF level rehab at this time.  PT to follow.    Follow Up Recommendations  SNF     Equipment Recommendations  None recommended by PT    Recommendations for Other Services       Precautions / Restrictions Precautions Precautions: Fall Precaution Comments: vac on L LE Restrictions Weight Bearing Restrictions: Yes LLE Weight Bearing: Non weight bearing    Mobility  Bed Mobility Overal bed mobility: Needs Assistance Bed Mobility: Rolling Rolling: Max assist;+2 for safety/equipment         General bed mobility comments: assist to roll, pt able to reach rail, but not turn hips without significant help; assist to hold during hygiene and linen change  Transfers                 General transfer comment: NT due to incontinence and sheets soaked with fluid from his L arm/flank, RN informed  Ambulation/Gait                 Stairs             Wheelchair Mobility    Modified Rankin (Stroke Patients Only)       Balance                                            Cognition Arousal/Alertness: Awake/alert Behavior  During Therapy: WFL for tasks assessed/performed Overall Cognitive Status: Impaired/Different from baseline Area of Impairment: Orientation;Attention;Safety/judgement;Following commands                 Orientation Level: Disoriented to;Place;Situation Current Attention Level: Sustained Memory: Decreased recall of precautions;Decreased short-term memory Following Commands: Follows one step commands with increased time     Problem Solving: Slow processing;Difficulty sequencing;Requires verbal cues General Comments: Thought he was at [facility] and that R leg was operated on      Exercises General Exercises - Lower Extremity Ankle Circles/Pumps: AAROM;5 reps;Supine;Right Heel Slides: AAROM;10 reps;Supine;Right Amputee Exercises Quad Sets: Supine;10 reps;Strengthening;Left Hip ABduction/ADduction: AROM;AAROM;10 reps;Supine;Left Straight Leg Raises: AROM;AAROM;10 reps;Supine;Left Other Exercises Other Exercises: patial sit/pull ups x 5 from elevated HOB pulling up with both hands to partial sit    General Comments General comments (skin integrity, edema, etc.): family member in the room and supportive, asking for help to get pt cleaned and very grateful for the help, states pt confused this pm, but was better this morning.      Pertinent Vitals/Pain Pain Assessment: No/denies pain    Home Living                      Prior  Function            PT Goals (current goals can now be found in the care plan section) Progress towards PT goals: Progressing toward goals    Frequency    Min 2X/week      PT Plan Current plan remains appropriate    Co-evaluation              AM-PAC PT "6 Clicks" Mobility   Outcome Measure  Help needed turning from your back to your side while in a flat bed without using bedrails?: Total Help needed moving from lying on your back to sitting on the side of a flat bed without using bedrails?: Total Help needed moving to and  from a bed to a chair (including a wheelchair)?: Total Help needed standing up from a chair using your arms (e.g., wheelchair or bedside chair)?: Total Help needed to walk in hospital room?: Total Help needed climbing 3-5 steps with a railing? : Total 6 Click Score: 6    End of Session   Activity Tolerance: Patient limited by fatigue Patient left: in bed;with call bell/phone within reach;with family/visitor present   PT Visit Diagnosis: Muscle weakness (generalized) (M62.81);Difficulty in walking, not elsewhere classified (R26.2);Other abnormalities of gait and mobility (R26.89)     Time: 1530-1555 PT Time Calculation (min) (ACUTE ONLY): 25 min  Charges:  $Therapeutic Exercise: 8-22 mins $Therapeutic Activity: 8-22 mins                     Magda Kiel, [place] [facility] [phone] 09/19/2019    Reginia Naas 09/19/2019, 6:07 PM

## 2019-09-19 NOTE — Progress Notes (Signed)
Inpatient Diabetes Program Recommendations  AACE/ADA: New Consensus Statement on Inpatient Glycemic Control   Target Ranges:  Prepandial:   less than 140 mg/dL      Peak postprandial:   less than 180 mg/dL (1-2 hours)      Critically ill patients:  140 - 180 mg/dL   Results for Darrell Mcclain, Darrell Mcclain (MRN [id number]) as of 09/19/2019 10:20  Ref. Range 09/18/2019 06:26 09/18/2019 12:26 09/18/2019 16:47 09/18/2019 21:51 09/19/2019 06:35  Glucose-Capillary Latest Ref Range: 70 - 99 mg/dL 300 (H) 317 (H) 415 (H) 401 (H) 325 (H)   Review of Glycemic Control  Diabetes history: DM2 Outpatient Diabetes medications: Lantus 10 units BID, Novolog 2-9 units TID with meals Current orders for Inpatient glycemic control: Lantus 15 units daily, Novolog 0-15 units TID with meals, Novolog 0-5 units QHS; Solumedrol 80 mg Q8H  Inpatient Diabetes Program Recommendations:   Insulin - Basal: If Solumedrol is continued, please consider increasing Lantus to 15 units BID. Please note that once steroids are tapered, Lantus will likely need to be decreased.  NOTE: Chart reviewed. Noted patient received Lantus 40 units on 09/12/19 and experienced hypoglycemia on 09/13/19. Also noted that since Solumedrol was started, glucose has been consistently elevated. Glucose over the past 24 hours has ranged from 300-415 mg/dl.   Thanks, Barnie Alderman, RN, MSN, CDE Diabetes Coordinator Inpatient Diabetes Program [phone] (Team Pager from 8am to 5pm)

## 2019-09-19 NOTE — Progress Notes (Signed)
Patient's Bipap is a PRN order.  Patient is not in any respiratory distress at this time.  Patient is still on [place] with O2 sats in the upper 90s to 100%.  Will continue to monitor.

## 2019-09-19 NOTE — Progress Notes (Signed)
[facility] Progress Note   Subjective:   pleasant.   "I got a kidney doctor"  UOP at least 800 - potassium normalized with lokelma   Objective Vitals:   09/19/19 0048 09/19/19 0512 09/19/19 0845 09/19/19 0908  BP: (!) 146/82 139/77 134/68 134/68  Pulse: 81 80 84 94  Resp: 13 11 15 18   Temp: 98 F (36.7 C) 98.6 F (37 C)  (!) 97.2 F (36.2 C)  TempSrc: Oral Oral  Oral  SpO2: 100% 100% 97% 100%  Weight:  107.4 kg    Height:       Physical Exam General: obese man lying in bed, no distress Heart: RRR, no rub Lungs: 94% on 2L with clear ant, dec BS bases Abdomen: soft, obese Extremities: L BKA, pitting edema Neuro:  Oriented to self and hospital   Additional Objective Labs: Basic Metabolic Panel: Recent Labs  Lab 09/18/19 0756 09/18/19 1544 09/18/19 2200 09/19/19 0608  NA 137 134*  --  137  K 5.1 6.0* 4.8 4.5  CL 102 102  --  102  CO2 24 25  --  24  GLUCOSE 308* 426*  --  342*  BUN 86* 89*  --  90*  CREATININE 2.78* 2.74*  --  2.72*  CALCIUM 8.2* 7.9*  --  8.3*  PHOS 5.0* 4.7*  --  4.5   Liver Function Tests: Recent Labs  Lab 09/18/19 0756 09/18/19 1544 09/19/19 0608  ALBUMIN 2.2* 2.2* 2.4*   No results for input(s): LIPASE, AMYLASE in the last 168 hours. CBC: Recent Labs  Lab 09/14/19 0315 09/15/19 0505 09/16/19 0124 09/17/19 1657 09/18/19 0918 09/19/19 0608  WBC 8.1 8.6 7.9  --  6.8 7.3  NEUTROABS 6.3 7.5  --   --  6.3  --   HGB 10.4* 10.0* 10.4*  --  10.9* 11.3*  HCT 36.2* 35.6* 38.5* 33.6* 38.7* 39.6  MCV 87.9 87.3 89.1  --  87.4 86.3  PLT 192 186 190  --  162 164   Blood Culture    Component Value Date/Time   SDES PLEURAL 09/12/2019 1454   SPECREQUEST Normal 09/12/2019 1454   CULT  09/12/2019 1454    NO GROWTH 3 DAYS Performed at [facility] [address], [place], [place] [address]    REPTSTATUS 09/15/2019 FINAL 09/12/2019 1454    Cardiac Enzymes: No results for input(s): CKTOTAL, CKMB, CKMBINDEX,  TROPONINI in the last 168 hours. CBG: Recent Labs  Lab 09/18/19 1226 09/18/19 1647 09/18/19 2151 09/19/19 0635 09/19/19 1128  GLUCAP 317* 415* 401* 325* 352*   Iron Studies: No results for input(s): IRON, TIBC, TRANSFERRIN, FERRITIN in the last 72 hours. @lablastinr3 @ Studies/Results: CT HEAD WO CONTRAST  Result Date: 09/17/2019 CLINICAL DATA:  Encephalopathy EXAM: CT HEAD WITHOUT CONTRAST TECHNIQUE: Contiguous axial images were obtained from the base of the skull through the vertex without intravenous contrast. COMPARISON:  Correlation made with MRI from 2017 FINDINGS: Brain: There is no acute intracranial hemorrhage, mass-effect, or edema. Gray-white differentiation is preserved. Confluent areas of hypoattenuation in the supratentorial white matter are nonspecific but probably reflect moderate to advanced chronic microvascular ischemic changes. There are small cortical right frontal and parietal cortical infarcts. There is no extra-axial fluid collection. Prominence of ventricles and sulci reflects generalized parenchymal volume loss. Vascular: There is atherosclerotic calcification at the skull base. Skull: Calvarium is unremarkable. Sinuses/Orbits: No acute finding. Other: None. IMPRESSION: No acute intracranial abnormality. Moderate to advanced chronic microvascular ischemic changes. Small chronic right  frontoparietal cortical infarcts. Electronically Signed   By: Macy Mis M.D.   On: 09/17/2019 18:11   US Renal  Result Date: 09/17/2019 CLINICAL DATA:  Acute renal failure EXAM: RENAL / URINARY TRACT ULTRASOUND COMPLETE COMPARISON:  CT of the pelvis dated 10/20/2018 FINDINGS: Right Kidney: Renal measurements: 10.6 x 5.4 x 5.3 cm = volume: 159 mL. There is no hydronephrosis. There is increased cortical echogenicity. Left Kidney: Renal measurements: 12.8 x 6.6 x 5.7 cm 252 = volume: 252 mL. There is no hydronephrosis. There is increased cortical echogenicity. Bladder: Appears normal for  degree of bladder distention. Both ureteral jets were visualized. Other: None. IMPRESSION: 1. No acute abnormality.  No evidence for hydronephrosis. 2. Echogenic kidneys bilaterally which can be seen in patients with medical renal disease. Electronically Signed   By: Constance Holster M.D.   On: 09/17/2019 19:30   DG CHEST PORT 1 VIEW  Result Date: 09/17/2019 CLINICAL DATA:  In cephalopathy EXAM: PORTABLE CHEST 1 VIEW COMPARISON:  09/12/2019 FINDINGS: Moderate bilateral pleural effusions, increasing on the left since prior study. Bilateral lower lobe airspace opacities. Cardiomegaly, vascular congestion. Multiple old left rib fractures. IMPRESSION: Moderate bilateral pleural effusions with bibasilar atelectasis or infiltrates, worsening on the left since prior study. Cardiomegaly, vascular congestion. Electronically Signed   By: Rolm Baptise M.D.   On: 09/17/2019 20:25   Medications: . cefTRIAXone (ROCEPHIN)  IV 1 g (09/18/19 1746)   . atorvastatin  20 mg Oral QPM  . budesonide (PULMICORT) nebulizer solution  0.5 mg Nebulization BID  . cyanocobalamin  1,000 mcg Subcutaneous Daily  . docusate sodium  100 mg Oral BID  . donepezil  10 mg Oral QHS  . dorzolamide  1 drop Both Eyes BID  . finasteride  5 mg Oral QPM  . fluticasone  2 spray Each Nare Daily  . insulin aspart  0-15 Units Subcutaneous TID WC  . insulin aspart  0-5 Units Subcutaneous QHS  . insulin glargine  15 Units Subcutaneous Daily  . ipratropium  0.5 mg Nebulization BID  . lactulose  20 g Oral BID  . levalbuterol  0.63 mg Nebulization BID  . loratadine  10 mg Oral Daily  . methylPREDNISolone (SOLU-MEDROL) injection  80 mg Intravenous Q8H  . multivitamin  1 tablet Oral Daily  . nutrition supplement (JUVEN)  1 packet Oral BID BM  . pantoprazole  40 mg Oral Daily  . Ensure Max Protein  11 oz Oral QHS  . rivaroxaban  15 mg Oral Q supper  . sodium chloride flush  10-40 mL Intracatheter Q12H  . tamsulosin  0.4 mg Oral Q breakfast    Darrell Mcclain is an 79 y.o. male with DM, prostate Ca, dementia, HTN, HL, BPH, chronic HFpEF, A flutter who is seen for eval and management of AKI on CKD 3 in setting of foot osteomyelitis now s/p amputation  Assessment/Plan: **AKI on CKD 3:  Nonoliguric. Pt with gradually worsening cr since admission which is suspect is secondary to tubular injury from hemodynamics with modest hypotension + mild hypovolemia with low urine sodium.   Imaging r/o obstruction - echogenic kidneys. UA not consistent with GN  Rate of rise of creatinine rise has declined today.  Kidney numbers essentially stable last 24 hours.  No indications for RRT and hopefully with supportive care he will improve as I would not consider him a candidate for dialysis.  -- appears euvolemic to overloaded. I would not give any more IVF.   Avoid NSAIDs and other nephrotoxins,  follow strict I/Os, daily weight, daily BMP for now.  Continue to hold home ACEi and diuretic.  **Possible UTI: on CTX.  **osteomyelitis L heel:  Was initially on antibiotics but now s/p L transtibial amputation 1/6.  Off antibiotics now.  Has wound vac.  Ortho following.   **Chronic hypoxic resp failure:  Has known chronic R pleural effusion. On home O2.    **Chronic HFrEF: can use diuretics to maintain volume status as needed. 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 Was more altered yesterday and primary pursuing additonal w/u - ct head neg acute change. Conversant today   DNR  [name]  09/19/2019, 11:47 AM  [facility]

## 2019-09-19 NOTE — TOC Progression Note (Addendum)
Transition of Care [facility]) - Progression Note    Patient Details  Name: Darrell Mcclain MRN: [id number] Date of Birth: 1941-07-16  Transition of Care [facility]) CM/SW [place], [place] Phone Number: 09/19/2019, 12:55 PM  Clinical Narrative:     received call from  [facility]- inquired if the patient needing authorization for SNF or CIR. Insurance needs updated PT notes with patient's home living and prior level of functioning noted in the PT notes.  CSW will submit updated PT notes once the patient has been seen by PT. RN updated  Thurmond Butts, MSW, LCSWA Clinical Social Worker       Expected Discharge Plan and Services                                                 Social Determinants of Health (SDOH) Interventions    Readmission Risk Interventions No flowsheet data found.

## 2019-09-20 LAB — RENAL FUNCTION PANEL
Albumin: 2.1 g/dL — ABNORMAL LOW (ref 3.5–5.0)
Anion gap: 10 (ref 5–15)
BUN: 98 mg/dL — ABNORMAL HIGH (ref 8–23)
CO2: 23 mmol/L (ref 22–32)
Calcium: 8.2 mg/dL — ABNORMAL LOW (ref 8.9–10.3)
Chloride: 102 mmol/L (ref 98–111)
Creatinine, Ser: 2.65 mg/dL — ABNORMAL HIGH (ref 0.61–1.24)
GFR calc Af Amer: 26 mL/min — ABNORMAL LOW (ref >60)
GFR calc non Af Amer: 22 mL/min — ABNORMAL LOW (ref >60)
Glucose, Bld: 330 mg/dL — ABNORMAL HIGH (ref 70–99)
Phosphorus: 3.7 mg/dL (ref 2.5–4.6)
Potassium: 4.4 mmol/L (ref 3.5–5.1)
Sodium: 135 mmol/L (ref 135–145)

## 2019-09-20 LAB — GLUCOSE, CAPILLARY
Glucose-Capillary: 302 mg/dL — ABNORMAL HIGH (ref 70–99)
Glucose-Capillary: 328 mg/dL — ABNORMAL HIGH (ref 70–99)
Glucose-Capillary: 313 mg/dL — ABNORMAL HIGH (ref 70–99)

## 2019-09-20 MED ORDER — GLUCERNA SHAKE PO LIQD
237.0000 mL | Freq: Three times a day (TID) | ORAL | Status: DC
  Administered 2019-09-20 – 2019-09-22 (×4): 237 mL via ORAL

## 2019-09-20 MED ORDER — METHYLPREDNISOLONE SODIUM SUCC 125 MG IJ SOLR
60.0000 mg | Freq: Two times a day (BID) | INTRAMUSCULAR | Status: DC
  Administered 2019-09-20 – 2019-09-22 (×4): 60 mg via INTRAVENOUS
  Filled 2019-09-20 (×4): qty 2

## 2019-09-20 MED ORDER — LEVALBUTEROL HCL 0.63 MG/3ML IN NEBU: 0.6300 mg | INHALATION_SOLUTION | Freq: Four times a day (QID) | RESPIRATORY_TRACT | Status: DC | PRN

## 2019-09-20 MED ORDER — IPRATROPIUM BROMIDE 0.02 % IN SOLN: 0.5000 mg | Freq: Four times a day (QID) | RESPIRATORY_TRACT | Status: DC | PRN

## 2019-09-20 MED ORDER — FUROSEMIDE 40 MG PO TABS
40.0000 mg | ORAL_TABLET | Freq: Two times a day (BID) | ORAL | Status: DC
  Administered 2019-09-20 – 2019-09-22 (×4): 40 mg via ORAL
  Filled 2019-09-20 (×4): qty 1

## 2019-09-20 MED ORDER — INSULIN GLARGINE 100 UNIT/ML ~~LOC~~ SOLN
24.0000 [IU] | Freq: Every day | SUBCUTANEOUS | Status: DC
  Administered 2019-09-20: 24 [IU] via SUBCUTANEOUS
  Filled 2019-09-20 (×2): qty 0.24

## 2019-09-20 NOTE — Progress Notes (Signed)
Inpatient Rehab Admissions Coordinator:   PT still recommending SNF.  Will sign off at this time.  I have let pt's daughter know, and will update CM/CSW.  Daughter [name]) did say she would like a call from MD for an update.    Shann Medal, PT, DPT Admissions Coordinator [phone] 09/20/19  10:17 AM

## 2019-09-20 NOTE — Progress Notes (Signed)
Pt is confused and attempting to remove mitts. This RN unable to attempt IV start at this time. Primary RN notified. IV consult completed

## 2019-09-20 NOTE — NC FL2 (Signed)
[place] MEDICAID FL2 LEVEL OF CARE SCREENING TOOL     IDENTIFICATION  Patient Name: Darrell Mcclain Birthdate: 1940-11-14 Sex: male Admission Date (Current Location): 09/09/2019  [facility] and [place] Number:  Herbalist and Address:  [facility] [place] [place], [place] [address] [place], [place] [address]      Provider Number: [id number]  Attending Physician Name and Address:  Eugenie Filler, MD  Relative Name and Phone Number:  [name] [phone]    Current Level of Care: Hospital Recommended Level of Care: [place] Prior Approval Number:    Date Approved/Denied:   PASRR Number: [id number]  Discharge Plan: SNF    Current Diagnoses: Patient Active Problem List   Diagnosis Date Noted  . Acute renal failure ([place])   . Encephalopathy acute   . Hypercarbia   . Hyperkalemia   . Acute lower UTI   . Pleural effusion   . Chronic respiratory failure with hypoxia ([place])   . Vascular dementia without behavioral disturbance ([place])   . Hypokalemia   . Goals of care, counseling/discussion   . Palliative care encounter   . Osteomyelitis ([place]) 09/10/2019  . Stage 3a chronic kidney disease 09/10/2019  . Diabetic foot infection ([place])   . Moderate protein-calorie malnutrition ([place])   . Malignant neoplasm of prostate ([place]) 09/10/2016  . Falls 11/21/2015  . Memory loss 11/21/2015  . Uncontrolled diabetes mellitus ([place]) 11/21/2015  . Morbid obesity ([place]) 11/21/2015  . Hyperlipidemia 11/21/2015  . Essential hypertension 11/21/2015  . 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 Chronic diastolic CHF (congestive heart failure), NYHA class 2 ([place]) 02/28/2014  . Atrial flutter ([place]) 10/13/2013    Orientation RESPIRATION BLADDER Height & Weight     Self  O2(4L) Incontinent, External catheter Weight: 238 lb 12.1 oz (108.3 kg) Height:  5\' 7"  (170.2 cm)  BEHAVIORAL SYMPTOMS/MOOD NEUROLOGICAL BOWEL NUTRITION STATUS      Incontinent  Diet(please see discharge summary)  AMBULATORY STATUS COMMUNICATION OF NEEDS Skin     Verbally Skin abrasions, Surgical wounds(deep tissue presure injury right heel, closed incision left leg, skin tear left knee, abrasion left & right ram, weeping right & left arm, closed incision penis (07/22/16))                       Personal Care Assistance Level of Assistance  Bathing, Feeding, Dressing Bathing Assistance: Maximum assistance Feeding assistance: Limited assistance Dressing Assistance: Maximum assistance     Functional Limitations Info  Sight, Hearing, Speech Sight Info: Adequate Hearing Info: Impaired Speech Info: Adequate    SPECIAL CARE FACTORS FREQUENCY  PT (By licensed PT), OT (By licensed OT)     PT Frequency: 3x per week OT Frequency: 3x per week            Contractures Contractures Info: Not present    Additional Factors Info  Code Status, Allergies, Insulin Sliding Scale Code Status Info: DNR Allergies Info: Ciprofloxacin,Neurontin,Oxycodone   Insulin Sliding Scale Info: insulin aspart (novoLOG) injection 0-15 Units 3x daily with meals       Current Medications (09/20/2019):  This is the current hospital active medication list Current Facility-Administered Medications  Medication Dose Route Frequency Provider Last Rate Last Admin  . acetaminophen (TYLENOL) tablet 650 mg  650 mg Oral Q6H PRN Persons, Bevely Palmer, [place]   650 mg at 09/14/19 1403   Or  . acetaminophen (TYLENOL) suppository 650 mg  650 mg Rectal Q6H PRN Persons, [facility]  Webb [place], [place]      . acetaminophen (TYLENOL) tablet 325-650 mg  325-650 mg Oral Q6H PRN Persons, Bevely Palmer, PA      . atorvastatin (LIPITOR) tablet 20 mg  20 mg Oral QPM Persons, Bevely Palmer, PA   20 mg at 09/19/19 1742  . budesonide (PULMICORT) nebulizer solution 0.5 mg  0.5 mg Nebulization BID Eugenie Filler, MD   0.5 mg at 09/20/19 0827  . cefTRIAXone (ROCEPHIN) 1 g in sodium chloride 0.9 % 100 mL IVPB  1 g Intravenous Q24H  Eugenie Filler, MD   Stopped at 09/19/19 1932  . cyanocobalamin ((VITAMIN B-12)) injection 1,000 mcg  1,000 mcg Subcutaneous Daily Eugenie Filler, MD   1,000 mcg at 09/20/19 1030  . docusate sodium (COLACE) capsule 100 mg  100 mg Oral BID Persons, Bevely Palmer, PA   100 mg at 09/19/19 2150  . donepezil (ARICEPT) tablet 10 mg  10 mg Oral QHS Persons, Bevely Palmer, [place]   10 mg at 09/19/19 2150  . dorzolamide (TRUSOPT) 2 % ophthalmic solution 1 drop  1 drop Both Eyes BID Persons, Bevely Palmer, PA   1 drop at 09/20/19 1032  . feeding supplement (GLUCERNA SHAKE) (GLUCERNA SHAKE) liquid 237 mL  237 mL Oral TID BM Eugenie Filler, MD      . finasteride (PROSCAR) tablet 5 mg  5 mg Oral QPM Persons, Bevely Palmer, PA   5 mg at 09/19/19 1742  . fluticasone (FLONASE) 50 MCG/ACT nasal spray 2 spray  2 spray Each Nare Daily Eugenie Filler, MD   2 spray at 09/20/19 1032  . furosemide (LASIX) tablet 40 mg  40 mg Oral BID Corliss Parish, MD      . HYDROcodone-acetaminophen (NORCO/VICODIN) 5-325 MG per tablet 1-2 tablet  1-2 tablet Oral Q4H PRN Persons, Bevely Palmer, [place]   1 tablet at 09/19/19 2254  . insulin aspart (novoLOG) injection 0-15 Units  0-15 Units Subcutaneous TID WC Eugenie Filler, MD   11 Units at 09/20/19 1312  . insulin aspart (novoLOG) injection 0-5 Units  0-5 Units Subcutaneous QHS Eugenie Filler, MD   3 Units at 09/19/19 2151  . insulin glargine (LANTUS) injection 24 Units  24 Units Subcutaneous Daily Eugenie Filler, MD   24 Units at 09/20/19 1028  . ipratropium (ATROVENT) nebulizer solution 0.5 mg  0.5 mg Nebulization Q6H PRN Eugenie Filler, MD      . levalbuterol Penne Lash) nebulizer solution 0.63 mg  0.63 mg Nebulization Q6H PRN Eugenie Filler, MD      . loratadine (CLARITIN) tablet 10 mg  10 mg Oral Daily Eugenie Filler, MD   10 mg at 09/20/19 1032  . methylPREDNISolone sodium succinate (SOLU-MEDROL) 125 mg/2 mL injection 60 mg  60 mg Intravenous Q12H Eugenie Filler, MD      . metoCLOPramide (REGLAN) tablet 5-10 mg  5-10 mg Oral Q8H PRN Persons, Bevely Palmer, PA       Or  . metoCLOPramide (REGLAN) injection 5-10 mg  5-10 mg Intravenous Q8H PRN Persons, Bevely Palmer, PA      . morphine 2 MG/ML injection 0.5-1 mg  0.5-1 mg Intravenous Q2H PRN Persons, Bevely Palmer, PA      . multivitamin (PROSIGHT) tablet 1 tablet  1 tablet Oral Daily Persons, Bevely Palmer, [place]   1 tablet at 09/20/19 1032  . nutrition supplement (JUVEN) (JUVEN) powder packet 1 packet  1 packet Oral BID BM Persons, Bevely Palmer, [place]  1 packet at 09/20/19 1027  . ondansetron (ZOFRAN) tablet 4 mg  4 mg Oral Q6H PRN Persons, Bevely Palmer, PA       Or  . ondansetron [facility]) injection 4 mg  4 mg Intravenous Q6H PRN Persons, Bevely Palmer, PA   4 mg at 09/11/19 1439  . ondansetron (ZOFRAN) tablet 4 mg  4 mg Oral Q6H PRN Persons, Bevely Palmer, PA       Or  . ondansetron [facility]) injection 4 mg  4 mg Intravenous Q6H PRN Persons, Bevely Palmer, PA      . pantoprazole (PROTONIX) EC tablet 40 mg  40 mg Oral Daily Persons, Bevely Palmer, PA   40 mg at 09/20/19 1031  . Rivaroxaban (XARELTO) tablet 15 mg  15 mg Oral Q supper Richardine Service, RPH   15 mg at 09/19/19 1742  . sodium chloride flush (NS) 0.9 % injection 10-40 mL  10-40 mL Intracatheter Q12H Eugenie Filler, MD   10 mL at 09/19/19 2152  . sodium chloride flush (NS) 0.9 % injection 10-40 mL  10-40 mL Intracatheter PRN Eugenie Filler, MD      . tamsulosin [facility]) capsule 0.4 mg  0.4 mg Oral Q breakfast Persons, Bevely Palmer, PA   0.4 mg at 09/20/19 1031     Discharge Medications: Please see discharge summary for a list of discharge medications.  Relevant Imaging Results:  Relevant Lab Results:   Additional Information SSN [id number]  Vinie Sill, [place]

## 2019-09-20 NOTE — Progress Notes (Signed)
[facility] Progress Note   Subjective:   pleasant.   Difficult to keep on task.  UOP not well recorded.  crt stable-  BUN up to 98   Objective Vitals:   09/19/19 0908 09/19/19 1952 09/20/19 0410 09/20/19 0827  BP: 134/68 138/65 (!) 145/77   Pulse: 94 93 94   Resp: 18 15 14    Temp: (!) 97.2 F (36.2 C) (!) 97.4 F (36.3 C) 97.9 F (36.6 C)   TempSrc: Oral Oral Oral   SpO2: 100% 99% 96% 97%  Weight:   108.3 kg   Height:       Physical Exam General: obese man trying to negotiate his lunch-  Somewhat successfully  Heart: RRR, no rub Lungs: 94% on 2L with clear ant, dec BS bases Abdomen: soft, obese Extremities: L BKA, pitting edema Neuro:  Oriented to self and hospital   Additional Objective Labs: Basic Metabolic Panel: Recent Labs  Lab 09/18/19 1544 09/18/19 2200 09/19/19 0608 09/20/19 0320  NA 134*  --  137 135  K 6.0* 4.8 4.5 4.4  CL 102  --  102 102  CO2 25  --  24 23  GLUCOSE 426*  --  342* 330*  BUN 89*  --  90* 98*  CREATININE 2.74*  --  2.72* 2.65*  CALCIUM 7.9*  --  8.3* 8.2*  PHOS 4.7*  --  4.5 3.7   Liver Function Tests: Recent Labs  Lab 09/18/19 1544 09/19/19 0608 09/20/19 0320  ALBUMIN 2.2* 2.4* 2.1*   No results for input(s): LIPASE, AMYLASE in the last 168 hours. CBC: Recent Labs  Lab 09/14/19 0315 09/15/19 0505 09/16/19 0124 09/17/19 1657 09/18/19 0918 09/19/19 0608  WBC 8.1 8.6 7.9  --  6.8 7.3  NEUTROABS 6.3 7.5  --   --  6.3  --   HGB 10.4* 10.0* 10.4*  --  10.9* 11.3*  HCT 36.2* 35.6* 38.5* 33.6* 38.7* 39.6  MCV 87.9 87.3 89.1  --  87.4 86.3  PLT 192 186 190  --  162 164   Blood Culture    Component Value Date/Time   SDES PLEURAL 09/12/2019 1454   SPECREQUEST Normal 09/12/2019 1454   CULT  09/12/2019 1454    NO GROWTH 3 DAYS Performed at [facility] [address] [place], [place] [address]    REPTSTATUS 09/15/2019 FINAL 09/12/2019 1454    Cardiac Enzymes: No results for input(s): CKTOTAL,  CKMB, CKMBINDEX, TROPONINI in the last 168 hours. CBG: Recent Labs  Lab 09/19/19 1128 09/19/19 1642 09/19/19 2133 09/20/19 0550 09/20/19 1149  GLUCAP 352* 364* 276* 302* 328*   Iron Studies: No results for input(s): IRON, TIBC, TRANSFERRIN, FERRITIN in the last 72 hours. @lablastinr3 @ Studies/Results: No results found. Medications: . cefTRIAXone (ROCEPHIN)  IV Stopped (09/19/19 1932)   . atorvastatin  20 mg Oral QPM  . budesonide (PULMICORT) nebulizer solution  0.5 mg Nebulization BID  . cyanocobalamin  1,000 mcg Subcutaneous Daily  . docusate sodium  100 mg Oral BID  . donepezil  10 mg Oral QHS  . dorzolamide  1 drop Both Eyes BID  . feeding supplement (GLUCERNA SHAKE)  237 mL Oral TID BM  . finasteride  5 mg Oral QPM  . fluticasone  2 spray Each Nare Daily  . insulin aspart  0-15 Units Subcutaneous TID WC  . insulin aspart  0-5 Units Subcutaneous QHS  . insulin glargine  24 Units Subcutaneous Daily  . loratadine  10 mg Oral Daily  .  methylPREDNISolone (SOLU-MEDROL) injection  60 mg Intravenous Q12H  . multivitamin  1 tablet Oral Daily  . nutrition supplement (JUVEN)  1 packet Oral BID BM  . pantoprazole  40 mg Oral Daily  . rivaroxaban  15 mg Oral Q supper  . sodium chloride flush  10-40 mL Intracatheter Q12H  . tamsulosin  0.4 mg Oral Q breakfast   Darrell Mcclain is an 79 y.o. male with DM, prostate Ca, dementia, HTN, HL, BPH, chronic HFpEF, A flutter who is seen for eval and management of AKI on CKD 3 in setting of foot osteomyelitis now s/p amputation  Assessment/Plan: **AKI on CKD 3:  Nonoliguric. Pt with gradually worsening cr since admission which I suspect is secondary to tubular injury from hemodynamics with modest hypotension + mild hypovolemia with low urine sodium.   Imaging r/o obstruction - echogenic kidneys. UA not consistent with GN  crt stable-  BUN up just a touch but close to 100.  No indications for RRT and hopefully with supportive care he will  improve as I would not consider him a candidate for dialysis.  I attempted to talk to him about this today.  He appears to understand, said that Darrell Mcclain also told him that dialysis would not be good for him. Understands the possible consequences of not doing it.  I will now officially take dialysis off the table of consideration  -- appears euvolemic to overloaded. I would not give any more IVF.   Avoid NSAIDs and other nephrotoxins, follow strict I/Os, daily weight, daily BMP for now.  Will re  introduce diuretic as weight has gone up  **Possible UTI: on CTX.  **osteomyelitis L heel:  Was initially on antibiotics but now s/p L transtibial amputation 1/6.  Off antibiotics now.  Has wound vac.  Ortho following.   **Chronic hypoxic resp failure:  Has known chronic R pleural effusion. On home O2.    **Chronic HFrEF: can use diuretics to maintain volume status as needed. 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  - ct head neg acute change. Conversant today - seems to understand what I am saying  DNR  Darrell Mcclain  09/20/2019, 12:19 PM  Darrell Mcclain

## 2019-09-20 NOTE — Progress Notes (Signed)
Nutrition Follow-up  RD working remotely.  DOCUMENTATION CODES:   Obesity unspecified  INTERVENTION:   -D/C Ensure MAX   Add Glucerna Shake po TID, each supplement provides 220 kcal and 10 grams of protein  Continue Juven Fruit Punch BID, each serving provides 95kcal and 2.5g of protein (amino acids glutamine and arginine)  Continue MVI daily   NUTRITION DIAGNOSIS:   Increased nutrient needs related to wound healing as evidenced by estimated needs.  Ongoing  GOAL:   Patient will meet greater than or equal to 90% of their needs  Progressing   MONITOR:   PO intake, Weight trends, Supplement acceptance, Skin, I & O's, Labs  REASON FOR ASSESSMENT:   Consult Wound healing  ASSESSMENT:   79 year old male with past medical history of T2DM, prostate cancer, vascular dementia, HTN, HLD, h/o CVA, BPH with h/o chronic indwelling foley (not present on admission), chronic diastolic CHF, CKD3, chronic respiratory failure on home O2, and afib who presented with left foot ulceration and admitted for osteomyelitis of left foot.  1/4- s/p thoracentesis, 100 ml cloudy fluid removed 1/6- s/p L BKA  Unable to reach by phone. Meal completions charted as 0-100% for his last eight meals (38% average). Taking Juven BID and Ensure Max daily. Will change to Glucerna TID to provide more kcal/protein. May need to consider Ensure Enlive if intake remains poor.   Admission weight: 99.3 kg  Current weight: 108.3 kg   I/O: +388 ml since admit  UOP: 250 ml x 24 hrs    Medications: Vit B12, colace, SS novolog, lantus, solumedrol, MVI  Labs: Cr 2.65- improving CBG 276-401  Diet Order:   Diet Order            Diet Carb Modified Fluid consistency: Thin; Room service appropriate? Yes  Diet effective now              EDUCATION NEEDS:   Not appropriate for education at this time  Skin:  Skin Assessment: Reviewed RN Assessment Skin Integrity Issues:: Incisions, DTI DTI: R heel Stage  III: R heel Unstageable: n/a Incisions: L leg, penis  Last BM:  1/11  Height:   Ht Readings from Last 1 Encounters:  09/10/19 5\' 7"  (1.702 m)    Weight:   Wt Readings from Last 1 Encounters:  09/20/19 108.3 kg    Ideal Body Weight:  62.9 kg(adjusted for lt BKA)  BMI:  Body mass index is 37.39 kg/m.  Estimated Nutritional Needs:   Kcal:  2000-2200  Protein:  110-125 grams  Fluid:  > 2 L  Mariana Single RD, LDN Clinical Nutrition Pager # [phone]

## 2019-09-20 NOTE — TOC Initial Note (Signed)
Transition of Care [facility]) - Initial/Assessment Note    Patient Details  Name: Darrell Mcclain MRN: [id number] Date of Birth: 14-May-1941  [facility] CM/SW Contact:    Vinie Sill, [place] Phone Number: 09/20/2019, 2:31 PM  Clinical Narrative:                  CSW called-spoke with the patient's daughter,[name]. CSW introduced self and explained role. Patient's daughter, confirm patient is form [facility]. He has been there since October. Family wants patient to return to [facility] when medically ready for discharge. Family states no questions or concerns at this time.  CSW contacted [facility] - they confirmed placement.  CSW will continue to follow and assist with discharge planning.  Thurmond Butts, MSW, [place] Clinical Social Worker    Expected Discharge Plan: Skilled Nursing Facility Barriers to Discharge: Continued Medical Work up, Ship broker   Patient Goals and CMS Choice        Expected Discharge Plan and Services Expected Discharge Plan: [place] In-house Referral: Clinical Social Work     Living arrangements for the past 2 months: [place]                                      Prior Living Arrangements/Services Living arrangements for the past 2 months: [place] Lives with:: Self, Facility Resident Patient language and need for interpreter reviewed:: No Do you feel safe going back to the place where you live?: Yes      Need for Family Participation in Patient Care: Yes (Comment) Care giver support system in place?: Yes (comment)   Criminal Activity/Legal Involvement Pertinent to Current Situation/Hospitalization: No - Comment as needed  Activities of Daily Living Home Assistive Devices/Equipment: CBG Meter, Wheelchair, Environmental consultant (specify type), Oxygen ADL Screening (condition at time of admission) Patient's cognitive ability adequate to safely complete daily  activities?: No Is the patient deaf or have difficulty hearing?: Yes Does the patient have difficulty seeing, even when wearing glasses/contacts?: No Does the patient have difficulty concentrating, remembering, or making decisions?: Yes Patient able to express need for assistance with ADLs?: Yes Does the patient have difficulty dressing or bathing?: Yes Independently performs ADLs?: No Communication: Independent Dressing (OT): Needs assistance Is this a change from baseline?: Pre-admission baseline Grooming: Needs assistance Is this a change from baseline?: Pre-admission baseline Feeding: Independent Bathing: Needs assistance Is this a change from baseline?: Pre-admission baseline Toileting: Needs assistance Is this a change from baseline?: Pre-admission baseline In/Out Bed: Needs assistance Is this a change from baseline?: Pre-admission baseline Walks in Home: Needs assistance Is this a change from baseline?: Pre-admission baseline Does the patient have difficulty walking or climbing stairs?: Yes Weakness of Legs: Both Weakness of Arms/Hands: None  Permission Sought/Granted Permission sought to share information with : Family Supports, Customer service manager, Case Optician, dispensing granted to share information with : Yes, Verbal Permission Granted  Share Information with NAME: [name] Spardin  Permission granted to share info w AGENCY: [facility]  Permission granted to share info w Relationship: daughter  Permission granted to share info w Contact Information: 1 [phone]  Emotional Assessment       Orientation: : Oriented to Self Alcohol / Substance Use: Not Applicable Psych Involvement: No (comment)  Admission diagnosis:  Osteomyelitis ([place]) [M86.9] Diabetic foot infection ([place]) [H84.696, L08.9] Patient Active Problem List   Diagnosis Date Noted  .  Acute renal failure ([place])   . Encephalopathy acute   . Hypercarbia   . Hyperkalemia   . Acute lower  UTI   . Pleural effusion   . Chronic respiratory failure with hypoxia ([place])   . Vascular dementia without behavioral disturbance ([place])   . Hypokalemia   . Goals of care, counseling/discussion   . Palliative care encounter   . Osteomyelitis ([place]) 09/10/2019  . Stage 3a chronic kidney disease 09/10/2019  . Diabetic foot infection ([place])   . Moderate protein-calorie malnutrition ([place])   . Malignant neoplasm of prostate ([place]) 09/10/2016  . Falls 11/21/2015  . Memory loss 11/21/2015  . Uncontrolled diabetes mellitus ([place]) 11/21/2015  . Morbid obesity ([place]) 11/21/2015  . Hyperlipidemia 11/21/2015  . Essential hypertension 11/21/2015  . 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 Chronic diastolic CHF (congestive heart failure), NYHA class 2 ([place]) 02/28/2014  . Atrial flutter ([place]) 10/13/2013   PCP:  Burnard Bunting, MD Pharmacy:   [place], [place] - [address] [place] [address] Phone: [phone] Fax: [phone]     Social Determinants of Health (SDOH) Interventions    Readmission Risk Interventions No flowsheet data found.

## 2019-09-20 NOTE — Progress Notes (Addendum)
PROGRESS NOTE    Darrell Mcclain  DUK:[id number] DOB: 09-Jul-1941 DOA: 09/09/2019 PCP: Burnard Bunting, MD    Brief Narrative:  79 year old male with history of diabetes mellitus type II, prostate cancer, dementia, hypertension, hyperlipidemia, unspecified CVA, BPH with history of chronic indwelling Foley (not currently present), chronic diastolic CHF, chronic kidney disease stage III, chronic respiratory failure on home oxygen, atrial flutter on Xarelto presented with foot infection.  MRI of the left foot showed osteomyelitis.  He was started on broad-spectrum antibiotics.  Orthopedics was consulted.    Assessment & Plan:   Principal Problem:   Osteomyelitis ([place]) Active Problems:   Atrial flutter (HCC)   Chronic diastolic CHF (congestive heart failure), NYHA class 2 (HCC)   Memory loss   Uncontrolled diabetes mellitus ([place])   Morbid obesity (HCC)   Hyperlipidemia   Essential hypertension   Malignant neoplasm of prostate (HCC)   Stage 3a chronic kidney disease   Diabetic foot infection ([place])   Moderate protein-calorie malnutrition ([place])   Goals of care, counseling/discussion   Palliative care encounter   Pleural effusion   Chronic respiratory failure with hypoxia (HCC)   Vascular dementia without behavioral disturbance (HCC)   Hypokalemia   Acute renal failure (HCC)   Encephalopathy acute   Hypercarbia   Hyperkalemia   Acute lower UTI  1 gangrenous left heel ulcer with acute osteomyelitis MRI of the left heel done consistent with left heel osteomyelitis.  ABIs were done.  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 [name].  IV antibiotics have been discontinued. Continue pain management.  Patient with wound VAC on and management per orthopedics.  2.  Chronic hypoxic respiratory failure on 2 L home O2/loculated chronic right sided pleural effusion CT chest which was done showed increased size of left pleural effusion,  moderate now, persistent stable right pleural effusion and suspected atelectasis in the right lung base also with stable mediastinal lymphadenopathy.  Chest x-ray done on admission showed slight increase in right loculated moderate pleural effusion.  Pulmonary was consulted and patient subsequently underwent ultrasound-guided thoracentesis of the right with fluid analysis consistent with exudative, lymphocytic predominant with 1100 nucleated cells with lymphocytosis but no malignancy identified.  Per pulmonary likely related to chronic medical conditions and suspected will reaccumulate like he did in the past.  Per pulmonary no further recommendations at this time.  Follow.  3.  Chronic diastolic heart failure/paroxysmal atrial flutter 2D echo from 07/01/2019 with a EF of 50 to 55% with moderate TR and severely elevated pulmonary artery pressure.  Patient with a urine output not properly recorded over the past 24 hours.  Urine output recorded was 250 cc over the past 24 hours.  Patient however did not look volume overloaded on 09/19/2019 and clinically looks improved. Patient with sats of 94 % on 4 L nasal cannula.  Blood pressure improved.  Lasix and lisinopril have been held due to worsening renal function..  Norvasc and Toprol-XL have been discontinued.  Continue Lipitor.  Saline lock IV fluids. Discussed with Dr. [name] orthopedics and patient Xarelto was resumed and renally dosed per pharmacy.  Follow.  4.  Moderate protein calorie malnutrition  5.  Diabetes mellitus type 2 Hemoglobin A1c 6.8.  Lantus was discontinued as patient noted to have a hypoglycemic episode the morning of 09/13/2019.  CBG of 302 this morning.  Increase Lantus to 24 units daily.  Sliding scale insulin.  Monitor CBGs with steroid taper.  6.  Acute on chronic kidney  disease stage III Creatinine was trending up.  Patient noted to have borderline blood pressure.  Diuretics and ACE inhibitor have been discontinued.  Likely secondary to  a prerenal azotemia.  Urinalysis with large leukocytes, 21-50 WBCs.  Urine sodium less than 10.  Renal ultrasound negative for hydronephrosis.  Continue to hold oral antihypertensives.  Vancomycin was discontinued with last dose on 09/14/2019.  Patient started to look volume overloaded however seems to have improved over the past 24 hours.  Urine output recorded is 250 cc however likely inaccurate..  Saline lock IV fluids.  Avoid nephrotoxins.  May need IV Lasix however will defer to nephrology. Nephrology following and I appreciate the input and recommendations.  7.  Hypokalemia/hyperkalemia Repeat potassium is 6.0 on 09/18/2019.  Patient given 2 doses of Lokelma 10 mg twice daily.  EKG with no T wave abnormalities.  Hyperkalemia resolved potassium currently of 4.4.  Patient on sliding scale insulin.  Follow.  8.  Anemia of chronic disease H&H stable at 11.3.  Follow.  9.  Hyperlipidemia Continue statin.  10.  Hypertension Blood pressure improved with hydration.  Patient was look volume overloaded however that has improved over the past 24 hours.  IV fluids have been saline locked.  Continue to hold antihypertensive medications.  Follow.   11.  History of prostate cancer Status post treatment in 2018  12.  Vascular dementia/acute metabolic encephalopathy secondary to hypercarbia Patient drowsy on 09/17/2019.  Clinically improved and more alert today however confused.  Head CT done was negative for any acute abnormalities.  TSH within normal limits.  Ammonia level was elevated at 37 repeat ammonia levels noted at 57 and now back down to 20 after lactulose..  Vitamin B12 445.  TSH within normal limits at 1.289.  RBC folate pending.  Neurontin has been discontinued.  ABG obtained concerning for respiratory acidosis and as such patient started on IV Solu-Medrol taper, Pulmicort, Xopenex and Atrovent nebs, Claritin, PPI, IV Rocephin.  Continue Aricept.  Lactulose has been discontinued.  Repeat ammonia  levels in the morning.  Continue vitamin B12 supplementation.  Saline lock IV fluids.  Fall precautions.  Palliative care consulted.   13.  Hyperkalemia Likely secondary to worsening renal function.  Lokelma 10 mg p.o. twice daily 09/18/2019 which has subsequently been discontinued.  Potassium currently of 4.4.  Follow.   14.  Probable UTI Urine cultures pending.  Continue IV Rocephin and treat for total of 5 days.   DVT prophylaxis: Heparin>> Xarelto Code Status: DNR Family Communication: Updated patient.  Updated daughter via telephone.  Disposition Plan: CIR versus SNF   Consultants:   Orthopedics: Dr.[name] 09/10/2019  Orthopedics: Dr. Lyla Glassing 09/10/2019  Wound care  Rib/PCCM: Dr. Valeta Harms 09/12/2019  Palliative care: Vinie Sill, NP/Dr. Rowe Pavy 09/13/2019  Inpatient rehab  Nephrology: Dr. Johnney Ou 09/17/2019  Procedures:   Transtibial amputation left lower extremity per Dr. [name] 09/14/2019  CT chest 09/12/2019  Chest x-ray 09/10/2019, 09/12/2019  Plain films of the orbits 09/10/2019  Plain films of the left foot 09/09/2019  MRI right heel 09/10/2019  MRI left heel 09/10/2019  ABIs with/without TBI 09/11/2019  Ultrasound-guided thoracentesis per Dr. Tamala Julian, pulmonary 09/12/2019  CT head 09/17/2019  Renal ultrasound 09/17/2019  Antimicrobials:   IV vancomycin 09/10/2019>>>> 09/14/2019  IV Flagyl 09/10/2019>>>>>> 09/16/2019  IV Rocephin 09/10/2019>>>>> 09/16/2019  IV Zosyn x1 dose 09/10/2019  IV Rocephin 09/17/2019   Subjective: Patient sitting up in chair.  Alert.  Following commands.  Pleasantly confused.  Denies any chest pain or  shortness of breath.    Objective: Vitals:   09/19/19 0908 09/19/19 1952 09/20/19 0410 09/20/19 0827  BP: 134/68 138/65 (!) 145/77   Pulse: 94 93 94   Resp: 18 15 14    Temp: (!) 97.2 F (36.2 C) (!) 97.4 F (36.3 C) 97.9 F (36.6 C)   TempSrc: Oral Oral Oral   SpO2: 100% 99% 96% 97%  Weight:   108.3 kg   Height:        Intake/Output Summary (Last 24 hours)  at 09/20/2019 1121 Last data filed at 09/19/2019 2150 Gross per 24 hour  Intake 600 ml  Output 250 ml  Net 350 ml   Filed Weights   09/18/19 0624 09/19/19 0512 09/20/19 0410  Weight: 103.3 kg 107.4 kg 108.3 kg    Examination:  General exam: No acute distress.  Alert.   Respiratory system: Bibasilar crackles.  No wheezing.  No rhonchi.  Normal respiratory effort.   Cardiovascular system: RRR no murmurs rubs or gallops.  No JVD.  1-2+ right lower extremity edema.  Gastrointestinal system: Abdomen is nontender, nondistended, soft, positive bowel sounds.  No rebound.  No guarding.  Central nervous system: Alert and oriented. No focal neurological deficits. Extremities: s/p left transtibial amputation and shrinker with wound VAC.  Skin: No rashes, lesions or ulcers Psychiatry: Judgement and insight appear fair. Mood & affect appropriate.     Data Reviewed: I have personally reviewed following labs and imaging studies  CBC: Recent Labs  Lab 09/14/19 0315 09/15/19 0505 09/16/19 0124 09/17/19 1657 09/18/19 0918 09/19/19 0608  WBC 8.1 8.6 7.9  --  6.8 7.3  NEUTROABS 6.3 7.5  --   --  6.3  --   HGB 10.4* 10.0* 10.4*  --  10.9* 11.3*  HCT 36.2* 35.6* 38.5* 33.6* 38.7* 39.6  MCV 87.9 87.3 89.1  --  87.4 86.3  PLT 192 186 190  --  162 163   Basic Metabolic Panel: Recent Labs  Lab 09/14/19 0315 09/17/19 0533 09/18/19 0756 09/18/19 1544 09/18/19 2200 09/19/19 0608 09/20/19 0320  NA 140 135 137 134*  --  137 135  K 4.0 5.1 5.1 6.0* 4.8 4.5 4.4  CL 100 99 102 102  --  102 102  CO2 32 29 24 25   --  24 23  GLUCOSE 129* 252* 308* 426*  --  342* 330*  BUN 39* 75* 86* 89*  --  90* 98*  CREATININE 1.51* 2.67* 2.78* 2.74*  --  2.72* 2.65*  CALCIUM 8.2* 8.1* 8.2* 7.9*  --  8.3* 8.2*  MG 1.9  --   --   --   --   --   --   PHOS  --   --  5.0* 4.7*  --  4.5 3.7   GFR: Estimated Creatinine Clearance: 27.8 mL/min (A) (by C-G formula based on SCr of 2.65 mg/dL (H)). Liver Function  Tests: Recent Labs  Lab 09/18/19 0756 09/18/19 1544 09/19/19 0608 09/20/19 0320  ALBUMIN 2.2* 2.2* 2.4* 2.1*   No results for input(s): LIPASE, AMYLASE in the last 168 hours. Recent Labs  Lab 09/17/19 1657 09/18/19 0918 09/19/19 0608  AMMONIA 37* 57* 20   Coagulation Profile: No results for input(s): INR, PROTIME in the last 168 hours. Cardiac Enzymes: No results for input(s): CKTOTAL, CKMB, CKMBINDEX, TROPONINI in the last 168 hours. BNP (last 3 results) No results for input(s): PROBNP in the last 8760 hours. HbA1C: No results for input(s): HGBA1C in the last 72 hours. CBG:  Recent Labs  Lab 09/19/19 0635 09/19/19 1128 09/19/19 1642 09/19/19 2133 09/20/19 0550  GLUCAP 325* 352* 364* 276* 302*   Lipid Profile: No results for input(s): CHOL, HDL, LDLCALC, TRIG, CHOLHDL, LDLDIRECT in the last 72 hours. Thyroid Function Tests: Recent Labs    09/17/19 1657  TSH 1.289   Anemia Panel: Recent Labs    09/17/19 1657  VITAMINB12 445   Sepsis Labs: No results for input(s): PROCALCITON, LATICACIDVEN in the last 168 hours.  Recent Results (from the past 240 hour(s))  MRSA PCR Screening     Status: None   Collection Time: 09/10/19  1:05 PM   Specimen: Nasal Mucosa; Nasopharyngeal  Result Value Ref Range Status   MRSA by PCR NEGATIVE NEGATIVE Final    Comment:        The GeneXpert MRSA Assay (FDA approved for NASAL specimens only), is one component of a comprehensive MRSA colonization surveillance program. It is not intended to diagnose MRSA infection nor to guide or monitor treatment for MRSA infections. Performed at [facility], [place] [address], [place], [place] [address]   Body fluid culture (includes gram stain)     Status: None   Collection Time: 09/12/19  2:54 PM   Specimen: Pleural Fluid  Result Value Ref Range Status   Specimen Description PLEURAL  Final   Special Requests Normal  Final   Gram Stain NO WBC SEEN NO ORGANISMS SEEN   Final    Culture   Final    NO GROWTH 3 DAYS Performed at [facility], [address] [place],  [address]    Report Status 09/15/2019 FINAL  Final         Radiology Studies: No results found.      Scheduled Meds: . atorvastatin  20 mg Oral QPM  . budesonide (PULMICORT) nebulizer solution  0.5 mg Nebulization BID  . cyanocobalamin  1,000 mcg Subcutaneous Daily  . docusate sodium  100 mg Oral BID  . donepezil  10 mg Oral QHS  . dorzolamide  1 drop Both Eyes BID  . finasteride  5 mg Oral QPM  . fluticasone  2 spray Each Nare Daily  . insulin aspart  0-15 Units Subcutaneous TID WC  . insulin aspart  0-5 Units Subcutaneous QHS  . insulin glargine  24 Units Subcutaneous Daily  . loratadine  10 mg Oral Daily  . methylPREDNISolone (SOLU-MEDROL) injection  60 mg Intravenous Q12H  . multivitamin  1 tablet Oral Daily  . nutrition supplement (JUVEN)  1 packet Oral BID BM  . pantoprazole  40 mg Oral Daily  . Ensure Max Protein  11 oz Oral QHS  . rivaroxaban  15 mg Oral Q supper  . sodium chloride flush  10-40 mL Intracatheter Q12H  . tamsulosin  0.4 mg Oral Q breakfast   Continuous Infusions: . cefTRIAXone (ROCEPHIN)  IV Stopped (09/19/19 1932)     LOS: 10 days    Time spent: 40 minutes    Irine Seal, MD [facility]  If 7PM-7AM, please contact night-coverage [email or web address] 09/20/2019, 11:21 AM

## 2019-09-20 NOTE — Progress Notes (Signed)
Patient's Bipap is PRN order.  Patient is resting comfortably at this time with no distress.  O2 sat is in upper 90s.  Patient does have on mitts on his hands at this time.  Will continue to monitor.

## 2019-09-20 NOTE — Progress Notes (Signed)
PT NOTE RE: Prior functional level  Level of Independence: Needs assistance   Gait / Transfers Assistance Needed: pt reports doing squat pivot with some assist from SNF staff PTA. Pt reports he has not ambulated since late October, when he had femur fracture s/p fall. Pt reports PT/OT have not been working with pt at [facility].  ADL's / Homemaking Assistance Needed: Pt reports getting assist for dressing/bathing at SNF.  According to Cherly Beach, caregiver, patient was ambulatory with walker and able to live alone managing bathing and dressing and cooking light meals with help about 3-4 days a week for heavier meals, groceries, medication management and had HHRN, PT, SLP as well up until his fall on 06/30/2019 when he fractured his femur and had pinning.    Magda Kiel, [place] [phone] 09/20/2019

## 2019-09-20 NOTE — TOC Progression Note (Signed)
Transition of Care [facility]) - Progression Note    Patient Details  Name: Darrell Mcclain MRN: [id number] Date of Birth: 12-Nov-1940  Transition of Care [facility]) CM/SW [place], [place] Phone Number: 09/20/2019, 4:22 PM  Clinical Narrative:     Received insurance approval # [id number]. Patient will return to [facility] when medically stable.  Patient will need covid test prior to discharge.    5Expected Discharge Plan: Skilled Nursing Facility Barriers to Discharge: Continued Medical Work up, Ship broker  Expected Discharge Plan and Services Expected Discharge Plan: [place] In-house Referral: Clinical Social Work     Living arrangements for the past 2 months: [place]                                       Social Determinants of Health (SDOH) Interventions    Readmission Risk Interventions No flowsheet data found.

## 2019-09-20 NOTE — Progress Notes (Signed)
Inpatient Diabetes Program Recommendations  AACE/ADA: New Consensus Statement on Inpatient Glycemic Control  Target Ranges:  Prepandial:   less than 140 mg/dL      Peak postprandial:   less than 180 mg/dL (1-2 hours)      Critically ill patients:  140 - 180 mg/dL   Results for LINWOOD, GULLIKSON (MRN [id number]) as of 09/20/2019 09:37  Ref. Range 09/19/2019 06:35 09/19/2019 11:28 09/19/2019 16:42 09/19/2019 21:33 09/20/2019 05:50  Glucose-Capillary Latest Ref Range: 70 - 99 mg/dL 325 (H) 352 (H) 364 (H) 276 (H) 302 (H)   Review of Glycemic Control  Diabetes history: DM2 Outpatient Diabetes medications: Lantus 10 units BID, Novolog 2-9 units TID with meals Current orders for Inpatient glycemic control: Lantus 24 units daily, Novolog 0-15 units TID with meals, Novolog 0-5 units QHS; Solumedrol 80 mg Q12H  Inpatient Diabetes Program Recommendations:   Insulin - Basal: If Solumedrol is continued as ordered, please consider increasing Lantus to 30 units daily.  Insulin-Correction: Please consider increasing Novolog correction to 0-20 units TID.  Thanks, Barnie [place]erman, RN, MSN, CDE Diabetes Coordinator Inpatient Diabetes Program [phone] (Team Pager from 8am to 5pm)

## 2019-09-21 LAB — RENAL FUNCTION PANEL
Albumin: 2.2 g/dL — ABNORMAL LOW (ref 3.5–5.0)
Anion gap: 9 (ref 5–15)
BUN: 98 mg/dL — ABNORMAL HIGH (ref 8–23)
CO2: 24 mmol/L (ref 22–32)
Calcium: 8.4 mg/dL — ABNORMAL LOW (ref 8.9–10.3)
Chloride: 104 mmol/L (ref 98–111)
Creatinine, Ser: 2.5 mg/dL — ABNORMAL HIGH (ref 0.61–1.24)
GFR calc Af Amer: 28 mL/min — ABNORMAL LOW (ref >60)
GFR calc non Af Amer: 24 mL/min — ABNORMAL LOW (ref >60)
Glucose, Bld: 327 mg/dL — ABNORMAL HIGH (ref 70–99)
Phosphorus: 3.8 mg/dL (ref 2.5–4.6)
Potassium: 4.2 mmol/L (ref 3.5–5.1)
Sodium: 137 mmol/L (ref 135–145)

## 2019-09-21 LAB — GLUCOSE, CAPILLARY
Glucose-Capillary: 377 mg/dL — ABNORMAL HIGH (ref 70–99)
Glucose-Capillary: 279 mg/dL — ABNORMAL HIGH (ref 70–99)
Glucose-Capillary: 318 mg/dL — ABNORMAL HIGH (ref 70–99)
Glucose-Capillary: 360 mg/dL — ABNORMAL HIGH (ref 70–99)
Glucose-Capillary: 280 mg/dL — ABNORMAL HIGH (ref 70–99)

## 2019-09-21 LAB — AMMONIA: Ammonia: 36 umol/L — ABNORMAL HIGH (ref 9–35)

## 2019-09-21 LAB — SARS CORONAVIRUS 2 (TAT 6-24 HRS): SARS Coronavirus 2: NEGATIVE

## 2019-09-21 MED ORDER — INSULIN GLARGINE 100 UNIT/ML ~~LOC~~ SOLN
30.0000 [IU] | Freq: Every day | SUBCUTANEOUS | Status: DC
  Administered 2019-09-21 – 2019-09-22 (×2): 30 [IU] via SUBCUTANEOUS
  Filled 2019-09-21 (×2): qty 0.3

## 2019-09-21 MED ORDER — INSULIN ASPART 100 UNIT/ML ~~LOC~~ SOLN
3.0000 [IU] | Freq: Three times a day (TID) | SUBCUTANEOUS | Status: DC
  Administered 2019-09-21 – 2019-09-22 (×5): 3 [IU] via SUBCUTANEOUS

## 2019-09-21 NOTE — Progress Notes (Signed)
Bipap is PRN order, no distress noted.  Will continue to monitor.

## 2019-09-21 NOTE — Progress Notes (Addendum)
Occupational Therapy Treatment Patient Details Name: Darrell Mcclain MRN: [id number] DOB: [birth year] Today's Date: 09/21/2019    History of present illness Pt is a 79 y/o male s/p L BKA on 09/14/2019 and superficial ulcer to RLE in [facility]. 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 Session focus on functional transfer training with stedy.Pt required MAX A +2 for all bed mobility needing MOD A to sit EOB ~ 5 mins. Attempted x3 standing trials from stedy but unable to come into full stand despite MAX- total A +2.  Continue to recommend SNF level therapy at time of DC, will follow acutely.    Follow Up Recommendations  SNF    Equipment Recommendations  Other (comment)(defer to next venue of care)    Recommendations for Other Services      Precautions / Restrictions Precautions Precautions: Fall Precaution Comments: vac on L LE Restrictions Weight Bearing Restrictions: Yes LLE Weight Bearing: Non weight bearing       Mobility Bed Mobility Overal bed mobility: Needs Assistance Bed Mobility: Rolling Rolling: Mod assist;+2 for physical assistance   Supine to sit: +2 for physical assistance;Max assist;HOB elevated Sit to supine: HOB elevated;Max assist;+2 for physical assistance   General bed mobility comments: pt required MAX A +2 for all bed mobility needing assist to advance LLE to EOB, scoot hips to EOB and elevate trunk.  Transfers Overall transfer level: Needs assistance Equipment used: Ambulation equipment used Transfers: Sit to/from Stand Sit to Stand: Total assist;+2 physical assistance;Max assist;From elevated surface          General transfer comment: unable to come into full stand despite x3 trials with MAX- total A +2 from elevated surface with stedy.    Balance Overall balance assessment: Needs assistance;History of Falls Sitting-balance support: Feet supported;Bilateral upper extremity supported Sitting balance-Leahy Scale: Poor Sitting balance - Comments: min-max A needed to sit EOB       Standing balance comment: not assessed                           ADL either performed or assessed with clinical judgement   ADL Overall ADL's : Needs assistance/impaired                 Upper Body Dressing : Moderate assistance;Sitting   Lower Body Dressing: Total assistance;Bed level   Toilet Transfer: +2 for physical assistance;+2 for safety/equipment;Total assistance Toilet Transfer Details (indicate cue type and reason): unable to come into full stand despite MAX A +2         Functional mobility during ADLs: Total assistance;+2 for physical assistance;+2 for safety/equipment;Cueing for safety;Cueing for sequencing General ADL Comments: Pt limited by decreased strength, pain in LLE, and decreased ability to care for self. attempted x3 sit>stand with stedy with pt unable to come into full stand despite total A +2.pt able to sit EOB with MODA ~ 45minutes     Vision Baseline Vision/History: No visual deficits Patient Visual Report: No change from baseline Vision Assessment?: No apparent visual deficits   Perception     Praxis      Cognition Arousal/Alertness: Awake/alert Behavior During Therapy: WFL for tasks assessed/performed Overall Cognitive Status: Impaired/Different from  baseline Area of Impairment: Attention;Following commands;Problem solving;Memory                   Current Attention Level: Sustained Memory: Decreased short-term memory Following Commands: Follows one step commands with increased time     Problem Solving: Slow processing;Difficulty  sequencing;Requires verbal cues General Comments: pt with continued confusion, slow to process needing cues for safety        Exercises     Shoulder Instructions       General Comments family member enter room at end of session, supportive and helpful Pt on 2L O2 at start of session with pt desaturating to 87%. Increased pt to 3L during functional activity with O2 WNL, left pt back on 2L at end of session.    Pertinent Vitals/ Pain       Pain Assessment: 0-10 Pain Score: 8  Pain Location: L residual limb Pain Descriptors / Indicators: Sore;Discomfort Pain Intervention(s): Monitored during session;Repositioned  Home Living                                          Prior Functioning/Environment              Frequency  Min 2X/week        Progress Toward Goals  OT Goals(current goals can now be found in the care plan section)  Progress towards OT goals: Progressing toward goals  Acute Rehab OT Goals Patient Stated Goal: get stronger, get a prosthesis OT Goal Formulation: With patient Time For Goal Achievement: 09/29/19 Potential to Achieve Goals: Good  Plan Discharge plan remains appropriate    Co-evaluation                 AM-PAC OT "6 Clicks" Daily Activity     Outcome Measure   Help from another person eating meals?: None Help from another person taking care of personal grooming?: A Little Help from another person toileting, which includes using toliet, bedpan, or urinal?: Total Help from another person bathing (including washing, rinsing, drying)?: A Lot Help from another person to put on and taking off regular upper body clothing?: A Lot Help from another person to put on and taking off regular lower body clothing?: Total 6 Click Score: 13    End of Session Equipment Utilized During Treatment: Gait belt;Other (comment)(stedy)  OT Visit Diagnosis: Unsteadiness on feet (R26.81);Muscle weakness (generalized) (M62.81);Pain Pain -  Right/Left: Left Pain - part of body: Leg   Activity Tolerance Patient tolerated treatment well   Patient Left in bed;with call bell/phone within reach;with family/visitor present   Nurse Communication          Time: 2355-7322 OT Time Calculation (min): 32 min  Charges: OT General Charges $OT Visit: 1 Visit OT Treatments $Therapeutic Activity: 8-22 mins  Lanier Clam., COTA/L Acute Rehabilitation Services [phone]    Ihor Gully 09/21/2019, 3:23 PM

## 2019-09-21 NOTE — Progress Notes (Signed)
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 Session focus on functional transfer training with stedy. Pt required MAX A +2 for all bed mobility and up to mod-max assist for sitting balance with cues/facilitation to correct posterior lean. Attempted x3 standing trials from stedy but unable to come into full stand despite max- total A +2. Once returned to supine pt performed rolling in both directions with mod +2 to change sheets secondary to being soiled from weeping edema.  Continue to recommend SNF level therapy at time of DC, will follow acutely.     Follow Up Recommendations  SNF     Equipment Recommendations  None recommended by PT    Recommendations for Other Services       Precautions / Restrictions Precautions Precautions: Fall Precaution Comments: vac on L LE Restrictions Weight Bearing Restrictions: Yes LLE Weight Bearing: Non weight bearing    Mobility  Bed Mobility Overal bed mobility: Needs Assistance Bed Mobility: Rolling Rolling: Mod assist;+2 for physical assistance   Supine to sit: +2 for physical assistance;Max assist;HOB elevated Sit to supine: HOB elevated;Max assist;+2 for physical assistance   General bed mobility comments: pt required MAX A +2 for all bed mobility needing assist to advance LLE to EOB, scoot hips to EOB and elevate trunk.  Transfers Overall transfer level: Needs  assistance Equipment used: Ambulation equipment used Transfers: Sit to/from Stand Sit to Stand: Total assist;+2 physical assistance;Max assist;From elevated surface         General transfer comment: unable to come into full stand despite x3 trials with MAX- total A +2 from elevated surface with stedy.  Ambulation/Gait                 Stairs             Wheelchair Mobility    Modified Rankin (Stroke Patients Only)       Balance Overall balance assessment: Needs assistance;History of Falls Sitting-balance support: Feet supported;Bilateral upper extremity supported Sitting balance-Leahy Scale: Poor Sitting balance - Comments: min-max A needed to sit EOB Postural control: Posterior lean     Standing balance comment: not assessed                            Cognition Arousal/Alertness: Awake/alert Behavior During Therapy: WFL for tasks assessed/performed Overall Cognitive Status: Impaired/Different from baseline Area of Impairment: Attention;Following commands;Problem solving;Memory                 Orientation Level: Disoriented to;Place;Situation Current Attention Level: Sustained Memory: Decreased short-term memory Following Commands: Follows one step commands with increased time     Problem Solving: Slow processing;Difficulty sequencing;Requires verbal cues General Comments: pt with continued confusion, slow to process needing cues for safety      Exercises      General Comments General comments (skin integrity, edema, etc.): family member enter room at end of session, supportive and helpful  Pertinent Vitals/Pain Pain Assessment: Faces Pain Score: 8  Faces Pain Scale: Hurts a little bit Pain Location: L residual limb Pain Descriptors / Indicators: Sore;Discomfort Pain Intervention(s): Monitored during session;Repositioned    Home Living                      Prior Function            PT Goals (current  goals can now be found in the care plan section) Acute Rehab PT Goals Patient Stated Goal: get stronger, get a prosthesis Progress towards PT goals: Progressing toward goals    Frequency    Min 2X/week      PT Plan Current plan remains appropriate    Co-evaluation PT/OT/SLP Co-Evaluation/Treatment: Yes Reason for Co-Treatment: Complexity of the patient's impairments (multi-system involvement);For patient/therapist safety;To address functional/ADL transfers          AM-PAC PT "6 Clicks" Mobility   Outcome Measure  Help needed turning from your back to your side while in a flat bed without using bedrails?: Total Help needed moving from lying on your back to sitting on the side of a flat bed without using bedrails?: Total Help needed moving to and from a bed to a chair (including a wheelchair)?: Total Help needed standing up from a chair using your arms (e.g., wheelchair or bedside chair)?: Total Help needed to walk in hospital room?: Total Help needed climbing 3-5 steps with a railing? : Total 6 Click Score: 6    End of Session Equipment Utilized During Treatment: Gait belt Activity Tolerance: Patient limited by fatigue Patient left: in bed;with call bell/phone within reach;with family/visitor present Nurse Communication: Mobility status PT Visit Diagnosis: Muscle weakness (generalized) (M62.81);Difficulty in walking, not elsewhere classified (R26.2);Other abnormalities of gait and mobility (R26.89)     Time: 1331-1405 PT Time Calculation (min) (ACUTE ONLY): 34 min  Charges:  $Therapeutic Activity: 8-22 mins                     Netta Corrigan, PT, DPT, CSRS Acute Rehab Office [place] 09/21/2019, 4:17 PM

## 2019-09-21 NOTE — Progress Notes (Signed)
[facility] Progress Note   Subjective:   pleasant.   Difficult to keep on task.  UOP at least a liter.  crt stable to slightly improved-  BUN stable  Objective Vitals:   09/20/19 2011 09/21/19 0458 09/21/19 0813 09/21/19 1111  BP: (!) 151/71 140/66  (!) 148/82  Pulse: 96 83  88  Resp: 18 13  19   Temp: 97.6 F (36.4 C) 97.7 F (36.5 C)  97.8 F (36.6 C)  TempSrc: Axillary Oral  Oral  SpO2: 97% 99% 100% 96%  Weight:  107.1 kg    Height:       Physical Exam General: obese man - pleasant dementia Heart: RRR, no rub Lungs: 94% on 2L with clear ant, dec BS bases Abdomen: soft, obese Extremities: L BKA, pitting edema Neuro:  Oriented to self and hospital   Additional Objective Labs: Basic Metabolic Panel: Recent Labs  Lab 09/19/19 0608 09/20/19 0320 09/21/19 0235  NA 137 135 137  K 4.5 4.4 4.2  CL 102 102 104  CO2 24 23 24   GLUCOSE 342* 330* 327*  BUN 90* 98* 98*  CREATININE 2.72* 2.65* 2.50*  CALCIUM 8.3* 8.2* 8.4*  PHOS 4.5 3.7 3.8   Liver Function Tests: Recent Labs  Lab 09/19/19 0608 09/20/19 0320 09/21/19 0235  ALBUMIN 2.4* 2.1* 2.2*   No results for input(s): LIPASE, AMYLASE in the last 168 hours. CBC: Recent Labs  Lab 09/15/19 0505 09/16/19 0124 09/17/19 1657 09/18/19 0918 09/19/19 0608  WBC 8.6 7.9  --  6.8 7.3  NEUTROABS 7.5  --   --  6.3  --   HGB 10.0* 10.4*  --  10.9* 11.3*  HCT 35.6* 38.5* 33.6* 38.7* 39.6  MCV 87.3 89.1  --  87.4 86.3  PLT 186 190  --  162 164   Blood Culture    Component Value Date/Time   SDES PLEURAL 09/12/2019 1454   SPECREQUEST Normal 09/12/2019 1454   CULT  09/12/2019 1454    NO GROWTH 3 DAYS Performed at [facility], [place] [address], [place], [place] [address]    REPTSTATUS 09/15/2019 FINAL 09/12/2019 1454    Cardiac Enzymes: No results for input(s): CKTOTAL, CKMB, CKMBINDEX, TROPONINI in the last 168 hours. CBG: Recent Labs  Lab 09/20/19 1149 09/20/19 1710 09/20/19 2100  09/21/19 0633 09/21/19 1109  GLUCAP 328* 313* 377* 279* 318*   Iron Studies: No results for input(s): IRON, TIBC, TRANSFERRIN, FERRITIN in the last 72 hours. @lablastinr3 @ Studies/Results: No results found. Medications: . cefTRIAXone (ROCEPHIN)  IV 1 g (09/20/19 1756)   . atorvastatin  20 mg Oral QPM  . budesonide (PULMICORT) nebulizer solution  0.5 mg Nebulization BID  . cyanocobalamin  1,000 mcg Subcutaneous Daily  . docusate sodium  100 mg Oral BID  . donepezil  10 mg Oral QHS  . dorzolamide  1 drop Both Eyes BID  . feeding supplement (GLUCERNA SHAKE)  237 mL Oral TID BM  . finasteride  5 mg Oral QPM  . fluticasone  2 spray Each Nare Daily  . furosemide  40 mg Oral BID  . insulin aspart  0-15 Units Subcutaneous TID WC  . insulin aspart  0-5 Units Subcutaneous QHS  . insulin aspart  3 Units Subcutaneous TID WC  . insulin glargine  30 Units Subcutaneous Daily  . loratadine  10 mg Oral Daily  . methylPREDNISolone (SOLU-MEDROL) injection  60 mg Intravenous Q12H  . multivitamin  1 tablet Oral Daily  . nutrition supplement (  JUVEN)  1 packet Oral BID BM  . pantoprazole  40 mg Oral Daily  . rivaroxaban  15 mg Oral Q supper  . sodium chloride flush  10-40 mL Intracatheter Q12H  . tamsulosin  0.4 mg Oral Q breakfast   Darrell Mcclain is an 79 y.o. male with DM, prostate Ca, dementia, HTN, HL, BPH, chronic HFpEF, A flutter who is seen for eval and management of AKI on CKD 3 in setting of foot osteomyelitis now s/p amputation  Assessment/Plan: **AKI on CKD 3:  Nonoliguric. Pt with gradually worsening cr since admission thought secondary to tubular injury from hemodynamics with modest hypotension + mild hypovolemia with low urine sodium.   Imaging r/o obstruction - echogenic kidneys. UA not consistent with GN  crt stable to improved-  BUN stable as well.  No indications for RRT and hopefully with supportive care he will continue to improve slowly as I would not consider him a candidate  for dialysis.  He appears to understand, said that [name] also told him that dialysis would not be good for him either. Understands the consequences of not doing it.  I will now officially take dialysis off the table of consideration  -- appears euvolemic to overloaded.  Have re introduced diuretic as weight has gone up with fine results.  Renal will sign off at this time.  From my standpoint could be discharged back to SNF -  I will arrange follow up at some point with Dr. Marval Regal at [facility] - call with questions   **Possible UTI: on CTX.  **osteomyelitis L heel:  Was initially on antibiotics but now s/p L transtibial amputation 1/6.  Off antibiotics now.  Has wound vac.  Ortho following.   **Chronic hypoxic resp failure:  Has known chronic R pleural effusion. On home O2.    **Chronic HFrEF: can use diuretics to maintain volume status as needed.   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  - ct head neg acute change. Conversant today - seems to understand what I am saying  DNR  [name]  09/21/2019, 11:30 AM  [facility]

## 2019-09-21 NOTE — Progress Notes (Signed)
S/P Left BKA  1 week. 0cc in [facility].   Asked to evaluate pressure area on Right heel .  Posterior heel ulcer 3 cm in diameter. No purulent drainage does not probe deep. Nor surrounding Cellultis or fluctuance. Protective dressing applied would continue with cshioned boot

## 2019-09-21 NOTE — Progress Notes (Signed)
PROGRESS NOTE    Darrell Mcclain  EHU:[id number] DOB: Jul 27, 1941 DOA: 09/09/2019 PCP: Burnard Bunting, MD     Brief Narrative:  Darrell Mcclain is a 79 year old male with history of diabetes mellitus type II, prostate cancer, dementia, hypertension, hyperlipidemia, unspecified CVA, BPH with history of chronic indwelling Foley (not currently present), chronic diastolic CHF, chronic kidney disease stage III, chronic respiratory failure on home oxygen, atrial flutter on Xarelto presented with foot infection. MRI of the left foot showed osteomyelitis. He was started on broad-spectrum antibiotics. Orthopedics was consulted.  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  Due to concern for worsening renal failure, nephrology was consulted.  New events last 24 hours / Subjective: Feeling well this morning, denies any new complaints today.  Assessment & Plan:   Principal Problem:   Osteomyelitis ([place]) Active Problems:   Atrial flutter (HCC)   Chronic diastolic CHF (congestive heart failure), NYHA class 2 (HCC)   Memory loss   Uncontrolled diabetes mellitus ([place])   Morbid obesity (HCC)   Hyperlipidemia   Essential hypertension   Malignant neoplasm of prostate (HCC)   Stage 3a chronic kidney disease   Diabetic foot infection ([place])   Moderate protein-calorie malnutrition ([place])   Goals of care, counseling/discussion   Palliative care encounter   Pleural effusion   Chronic respiratory failure with hypoxia (HCC)   Vascular dementia without behavioral disturbance (HCC)   Hypokalemia   Acute renal failure (HCC)   Encephalopathy acute   Hypercarbia   Hyperkalemia   Acute lower UTI    Gangrenous left heel ulcer with acute osteomyelitis -MRI of the left heel consistent with left heel osteomyelitis.  ABIs were done. Orthopedics consulted and patient underwent left transtibial amputation 09/14/2019 per Dr. [name].  IV antibiotics have been discontinued. Continue pain management.  Patient  with wound VAC on and management per orthopedics.  Chronic hypoxic respiratory failure on 2 L home O2/loculated chronic right sided pleural effusion -CT chest which was done showed increased size of left pleural effusion, moderate now, persistent stable right pleural effusion and suspected atelectasis in the right lung base also with stable mediastinal lymphadenopathy.  Chest x-ray done on admission showed slight increase in right loculated moderate pleural effusion.  Pulmonary was consulted and patient subsequently underwent ultrasound-guided thoracentesis of the right with fluid analysis consistent with exudative, lymphocytic predominant with 1100 nucleated cells with lymphocytosis but no malignancy identified.  Per pulmonary likely related to chronic medical conditions and suspected will reaccumulate like he did in the past.  Per pulmonary no further recommendations at this time. -On 2 L nasal cannula O2 this morning which is his baseline  Chronic diastolic heart failure -2D echo from 07/01/2019 with a EF of 50 to 55% with moderate TR and severely elevated pulmonary artery pressure. Lasix and lisinopril have been held due to worsening renal function. Norvasc and Toprol-XL have been discontinued.  -Lasix resumed  Paroxysmal atrial flutter -Xarelto  Diabetes mellitus type 2 -Hemoglobin A1c 6.8 -Lantus, sliding scale insulin.  Add mealtime coverage due to hyperglycemia  AKI on chronic kidney disease stage IIIa -Likely secondary to tubular injury from hemodynamics with modest hypotension, hypovolemia -Appreciate nephrology; creatinine continues to slowly improve.  Nephrology signed off 1/13.  Follow-up with Dr. Marval Regal  Anemia of chronic disease -Stable  Hyperlipidemia -Continue statin  Vascular dementia/acute metabolic encephalopathy secondary to hypercarbia -Stable.  Continue Aricept  UTI -Urine cultures pending.  Continue IV Rocephin and treat for total of 5 days    In  agreement with  assessment of the pressure ulcer as below:  Pressure Injury 09/10/19 Heel Right Deep Tissue Pressure Injury - Purple or maroon localized area of discolored intact skin or blood-filled blister due to damage of underlying soft tissue from pressure and/or shear. (Active)  09/10/19 1000  Location: Heel  Location Orientation: Right  Staging: Deep Tissue Pressure Injury - Purple or maroon localized area of discolored intact skin or blood-filled blister due to damage of underlying soft tissue from pressure and/or shear.  Wound Description (Comments):   Present on Admission:       DVT prophylaxis: Xarelto Code Status: DNR Family Communication: None at bedside  Disposition Plan: SNF placement, repeat COVID pending    Consultants:   Orthopedic surgery  Critical care  Palliative care medicine  Nephrology   Antimicrobials:  Anti-infectives (From admission, onward)   Start     Dose/Rate Route Frequency Ordered Stop   09/17/19 1730  cefTRIAXone (ROCEPHIN) 1 g in sodium chloride 0.9 % 100 mL IVPB     1 g 200 mL/hr over 30 Minutes Intravenous Every 24 hours 09/17/19 1640 09/22/19 1759   09/14/19 1600  vancomycin (VANCOREADY) IVPB 750 mg/150 mL  Status:  Discontinued     750 mg 150 mL/hr over 60 Minutes Intravenous Every 24 hours 09/14/19 1418 09/15/19 1739   09/14/19 1000  ceFAZolin (ANCEF) IVPB 2g/100 mL premix     2 g 200 mL/hr over 30 Minutes Intravenous On call to O.R. 09/14/19 0837 09/14/19 1142   09/11/19 1400  vancomycin (VANCOREADY) IVPB 1250 mg/250 mL  Status:  Discontinued     1,250 mg 166.7 mL/hr over 90 Minutes Intravenous Every 24 hours 09/10/19 0445 09/11/19 0855   09/11/19 1400  vancomycin (VANCOCIN) IVPB 1000 mg/200 mL premix  Status:  Discontinued     1,000 mg 200 mL/hr over 60 Minutes Intravenous Every 24 hours 09/11/19 0855 09/14/19 1418   09/10/19 1030  cefTRIAXone (ROCEPHIN) 2 g in sodium chloride 0.9 % 100 mL IVPB  Status:  Discontinued     2  g 200 mL/hr over 30 Minutes Intravenous Every 24 hours 09/10/19 0921 09/16/19 1034   09/10/19 1030  metroNIDAZOLE (FLAGYL) IVPB 500 mg  Status:  Discontinued     500 mg 100 mL/hr over 60 Minutes Intravenous Every 8 hours 09/10/19 0921 09/16/19 1034   09/10/19 0445  vancomycin (VANCOREADY) IVPB 2000 mg/400 mL     2,000 mg 200 mL/hr over 120 Minutes Intravenous  Once 09/10/19 0440 09/10/19 1405   09/10/19 0445  piperacillin-tazobactam (ZOSYN) IVPB 3.375 g  Status:  Discontinued     3.375 g 12.5 mL/hr over 240 Minutes Intravenous Every 8 hours 09/10/19 0440 09/10/19 0921        Objective: Vitals:   09/20/19 2011 09/21/19 0458 09/21/19 0813 09/21/19 1111  BP: (!) 151/71 140/66  (!) 148/82  Pulse: 96 83  88  Resp: 18 13  19   Temp: 97.6 F (36.4 C) 97.7 F (36.5 C)  97.8 F (36.6 C)  TempSrc: Axillary Oral  Oral  SpO2: 97% 99% 100% 96%  Weight:  107.1 kg    Height:        Intake/Output Summary (Last 24 hours) at 09/21/2019 1237 Last data filed at 09/21/2019 0459 Gross per 24 hour  Intake --  Output 1050 ml  Net -1050 ml   Filed Weights   09/19/19 0512 09/20/19 0410 09/21/19 0458  Weight: 107.4 kg 108.3 kg 107.1 kg    Examination:  General exam: Appears calm and  comfortable  Respiratory system: Clear to auscultation. Respiratory effort normal. No respiratory distress. No conversational dyspnea.  On nasal cannula O2 Cardiovascular system: S1 & S2 heard, RRR. No murmurs. No pedal edema. Gastrointestinal system: Abdomen is nondistended, soft and nontender. Normal bowel sounds heard. Central nervous system: Alert, nonfocal exam Extremities: Left BKA Psychiatry: Stable   Data Reviewed: I have personally reviewed following labs and imaging studies  CBC: Recent Labs  Lab 09/15/19 0505 09/16/19 0124 09/17/19 1657 09/18/19 0918 09/19/19 0608  WBC 8.6 7.9  --  6.8 7.3  NEUTROABS 7.5  --   --  6.3  --   HGB 10.0* 10.4*  --  10.9* 11.3*  HCT 35.6* 38.5* 33.6* 38.7* 39.6   MCV 87.3 89.1  --  87.4 86.3  PLT 186 190  --  162 789   Basic Metabolic Panel: Recent Labs  Lab 09/18/19 0756 09/18/19 1544 09/18/19 2200 09/19/19 0608 09/20/19 0320 09/21/19 0235  NA 137 134*  --  137 135 137  K 5.1 6.0* 4.8 4.5 4.4 4.2  CL 102 102  --  102 102 104  CO2 24 25  --  24 23 24   GLUCOSE 308* 426*  --  342* 330* 327*  BUN 86* 89*  --  90* 98* 98*  CREATININE 2.78* 2.74*  --  2.72* 2.65* 2.50*  CALCIUM 8.2* 7.9*  --  8.3* 8.2* 8.4*  PHOS 5.0* 4.7*  --  4.5 3.7 3.8   GFR: Estimated Creatinine Clearance: 29.3 mL/min (A) (by C-G formula based on SCr of 2.5 mg/dL (H)). Liver Function Tests: Recent Labs  Lab 09/18/19 0756 09/18/19 1544 09/19/19 0608 09/20/19 0320 09/21/19 0235  ALBUMIN 2.2* 2.2* 2.4* 2.1* 2.2*   No results for input(s): LIPASE, AMYLASE in the last 168 hours. Recent Labs  Lab 09/17/19 1657 09/18/19 0918 09/19/19 0608 09/21/19 0235  AMMONIA 37* 57* 20 36*   Coagulation Profile: No results for input(s): INR, PROTIME in the last 168 hours. Cardiac Enzymes: No results for input(s): CKTOTAL, CKMB, CKMBINDEX, TROPONINI in the last 168 hours. BNP (last 3 results) No results for input(s): PROBNP in the last 8760 hours. HbA1C: No results for input(s): HGBA1C in the last 72 hours. CBG: Recent Labs  Lab 09/20/19 1149 09/20/19 1710 09/20/19 2100 09/21/19 0633 09/21/19 1109  GLUCAP 328* 313* 377* 279* 318*   Lipid Profile: No results for input(s): CHOL, HDL, LDLCALC, TRIG, CHOLHDL, LDLDIRECT in the last 72 hours. Thyroid Function Tests: No results for input(s): TSH, T4TOTAL, FREET4, T3FREE, THYROIDAB in the last 72 hours. Anemia Panel: No results for input(s): VITAMINB12, FOLATE, FERRITIN, TIBC, IRON, RETICCTPCT in the last 72 hours. Sepsis Labs: No results for input(s): PROCALCITON, LATICACIDVEN in the last 168 hours.  Recent Results (from the past 240 hour(s))  Body fluid culture (includes gram stain)     Status: None   Collection  Time: 09/12/19  2:54 PM   Specimen: Pleural Fluid  Result Value Ref Range Status   Specimen Description PLEURAL  Final   Special Requests Normal  Final   Gram Stain NO WBC SEEN NO ORGANISMS SEEN   Final   Culture   Final    NO GROWTH 3 DAYS Performed at [facility], [address], [place], [place] [address]    Report Status 09/15/2019 FINAL  Final      Radiology Studies: No results found.    Scheduled Meds: . atorvastatin  20 mg Oral QPM  . budesonide (PULMICORT) nebulizer solution  0.5 mg Nebulization BID  . cyanocobalamin  1,000 mcg Subcutaneous Daily  . docusate sodium  100 mg Oral BID  . donepezil  10 mg Oral QHS  . dorzolamide  1 drop Both Eyes BID  . feeding supplement (GLUCERNA SHAKE)  237 mL Oral TID BM  . finasteride  5 mg Oral QPM  . fluticasone  2 spray Each Nare Daily  . furosemide  40 mg Oral BID  . insulin aspart  0-15 Units Subcutaneous TID WC  . insulin aspart  0-5 Units Subcutaneous QHS  . insulin aspart  3 Units Subcutaneous TID WC  . insulin glargine  30 Units Subcutaneous Daily  . loratadine  10 mg Oral Daily  . methylPREDNISolone (SOLU-MEDROL) injection  60 mg Intravenous Q12H  . multivitamin  1 tablet Oral Daily  . nutrition supplement (JUVEN)  1 packet Oral BID BM  . pantoprazole  40 mg Oral Daily  . rivaroxaban  15 mg Oral Q supper  . sodium chloride flush  10-40 mL Intracatheter Q12H  . tamsulosin  0.4 mg Oral Q breakfast   Continuous Infusions: . cefTRIAXone (ROCEPHIN)  IV 1 g (09/20/19 1756)     LOS: 11 days      Time spent: 35 minutes   Dessa Phi, DO [facility] 09/21/2019, 12:37 PM   Available via Epic secure chat 7am-7pm After these hours, please refer to coverage provider listed on [email or web address]

## 2019-09-21 NOTE — Discharge Instructions (Signed)

## 2019-09-22 LAB — RENAL FUNCTION PANEL
Albumin: 2.1 g/dL — ABNORMAL LOW (ref 3.5–5.0)
Anion gap: 9 (ref 5–15)
BUN: 102 mg/dL — ABNORMAL HIGH (ref 8–23)
CO2: 26 mmol/L (ref 22–32)
Calcium: 8.3 mg/dL — ABNORMAL LOW (ref 8.9–10.3)
Chloride: 102 mmol/L (ref 98–111)
Creatinine, Ser: 2.62 mg/dL — ABNORMAL HIGH (ref 0.61–1.24)
GFR calc Af Amer: 26 mL/min — ABNORMAL LOW (ref >60)
GFR calc non Af Amer: 23 mL/min — ABNORMAL LOW (ref >60)
Glucose, Bld: 289 mg/dL — ABNORMAL HIGH (ref 70–99)
Phosphorus: 3.6 mg/dL (ref 2.5–4.6)
Potassium: 3.8 mmol/L (ref 3.5–5.1)
Sodium: 137 mmol/L (ref 135–145)

## 2019-09-22 LAB — GLUCOSE, CAPILLARY
Glucose-Capillary: 289 mg/dL — ABNORMAL HIGH (ref 70–99)
Glucose-Capillary: 298 mg/dL — ABNORMAL HIGH (ref 70–99)
Glucose-Capillary: 313 mg/dL — ABNORMAL HIGH (ref 70–99)
Glucose-Capillary: 307 mg/dL — ABNORMAL HIGH (ref 70–99)

## 2019-09-22 LAB — CBC
HCT: 39.1 % (ref 39.0–52.0)
Hemoglobin: 11.4 g/dL — ABNORMAL LOW (ref 13.0–17.0)
MCH: 24.5 pg — ABNORMAL LOW (ref 26.0–34.0)
MCHC: 29.2 g/dL — ABNORMAL LOW (ref 30.0–36.0)
MCV: 84.1 fL (ref 80.0–100.0)
Platelets: 201 10*3/uL (ref 150–400)
RBC: 4.65 MIL/uL (ref 4.22–5.81)
RDW: 17.5 % — ABNORMAL HIGH (ref 11.5–15.5)
WBC: 8 10*3/uL (ref 4.0–10.5)
nRBC: 0 % (ref 0.0–0.2)

## 2019-09-22 MED ORDER — HYDROCODONE-ACETAMINOPHEN 5-325 MG PO TABS: 1.0000 | ORAL_TABLET | ORAL | 0 refills | Status: AC | PRN

## 2019-09-22 MED ORDER — FUROSEMIDE 20 MG PO TABS: 20.0000 mg | ORAL_TABLET | Freq: Two times a day (BID) | ORAL | 2 refills | Status: DC

## 2019-09-22 NOTE — Discharge Summary (Signed)
Physician Discharge Summary  Darrell Mcclain DGL:[id number] DOB: Sep 03, 1941 DOA: 09/09/2019  PCP: Burnard Bunting, MD  Admit date: 09/09/2019 Discharge date: 09/22/2019  Admitted From: SNF Disposition:  SNF   Recommendations for Outpatient Follow-up:  1. Follow up with PCP in 1 week 2. Follow up with Dr. Marval Regal at [facility] as arranged 3. Repeat BMP to check kidney function in 1 week 4. Follow-up with Dr. [name] in 1 week, continue wound VAC until follow-up  Discharge Condition: Stable CODE STATUS: DNR Diet recommendation: Heart healthy diet  Brief/Interim Summary: Darrell Mcclain is a 79 year old male with history of diabetes mellitus type II, prostate cancer, dementia, hypertension, hyperlipidemia, unspecified CVA, BPH with history of chronic indwelling Foley (not currently present), chronic diastolic CHF, chronic kidney disease stage III, chronic respiratory failure on home oxygen, atrial flutter on Xarelto presented with foot infection. MRI of the left foot showed osteomyelitis. He was started on broad-spectrum antibiotics. Orthopedics was consulted.  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  Due to concern for worsening renal failure, nephrology was consulted.  Discharge Diagnoses:  Principal Problem:   Osteomyelitis ([place]) Active Problems:   Atrial flutter (HCC)   Chronic diastolic CHF (congestive heart failure), NYHA class 2 (HCC)   Memory loss   Uncontrolled diabetes mellitus ([place])   Morbid obesity (HCC)   Hyperlipidemia   Essential hypertension   Malignant neoplasm of prostate (HCC)   Stage 3a chronic kidney disease   Diabetic foot infection ([place])   Moderate protein-calorie malnutrition ([place])   Goals of care, counseling/discussion   Palliative care encounter   Pleural effusion   Chronic respiratory failure with hypoxia (HCC)   Vascular dementia without behavioral disturbance (HCC)   Hypokalemia   Acute renal failure (HCC)   Encephalopathy  acute   Hypercarbia   Hyperkalemia   Acute lower UTI    Gangrenous left heel ulcer with acute osteomyelitis -MRI of the left heel consistent with left heel osteomyelitis. ABIs were done.Orthopedics consulted and patient underwent left transtibial amputation 09/14/2019 per Dr. [name]. IV antibiotics have been discontinued. Continue pain management. Patient with wound VAC on and management per orthopedics.  Chronic hypoxic respiratory failure on 2 L home O2/loculated chronic right sided pleural effusion -CT chest which was done showed increased size of left pleural effusion, moderate now, persistent stable right pleural effusion and suspected atelectasis in the right lung base also with stable mediastinal lymphadenopathy. Chest x-ray done on admission showed slight increase in right loculated moderate pleural effusion. Pulmonary was consulted and patient subsequently underwent ultrasound-guided thoracentesis of the right with fluid analysis consistent with exudative, lymphocytic predominant with 1100 nucleated cells with lymphocytosis but no malignancy identified. Per pulmonary likely related to chronic medical conditions and suspected will reaccumulate like he did in the past. Per pulmonary no further recommendations at this time. -On 1 L nasal cannula O2 this morning   Chronic diastolic heart failure -2D echo from 07/01/2019 with a EF of 50 to 55% with moderate TR and severely elevated pulmonary artery pressure. Lasix and lisinopril have been held due to worsening renal function.Norvasc and Toprol-XL have been discontinued.  -Lasix resumed at lower dose on discharge  Paroxysmal atrial flutter -Xarelto  Diabetes mellitus type 2 -Hemoglobin A1c 6.8 -Lantus, NovoLog sliding scale insulin  AKI on chronic kidney disease stage IIIa -Likely secondary to tubular injury from hemodynamics with modest hypotension, hypovolemia -Appreciate nephrology; Nephrology signed off 1/13.  Follow-up  with Dr. Marval Regal  Anemia of chronic disease -Stable  Hyperlipidemia -Continue statin  Vascular dementia/acute metabolic encephalopathy secondary to hypercarbia -Stable.  Continue Aricept  UTI -Completed 5 days Rocephin   In agreement with assessment of the pressure ulcer as below:  Pressure Injury 09/10/19 Heel Right Deep Tissue Pressure Injury - Purple or maroon localized area of discolored intact skin or blood-filled blister due to damage of underlying soft tissue from pressure and/or shear. (Active)  09/10/19 1000  Location: Heel  Location Orientation: Right  Staging: Deep Tissue Pressure Injury - Purple or maroon localized area of discolored intact skin or blood-filled blister due to damage of underlying soft tissue from pressure and/or shear.  Wound Description (Comments):   Present on Admission:     Discharge Instructions  Discharge Instructions    Diet - low sodium heart healthy   Complete by: As directed    Neg Press Wound Therapy / Incisional   Complete by: As directed    Show patient how to attach prevena vac     Allergies as of 09/22/2019      Reactions   Ciprofloxacin Diarrhea   Neurontin [gabapentin]    Hallucinations/altered mental status   Oxycodone Other (See Comments)   Altered mental status      Medication List    STOP taking these medications   Hyoscyamine Sulfate SL 0.125 MG Subl Commonly known as: Levsin/SL   metoprolol succinate 50 MG 24 hr tablet Commonly known as: TOPROL-XL     TAKE these medications   acetaminophen 500 MG tablet Commonly known as: TYLENOL Take 500 mg by mouth every 6 (six) hours as needed for mild pain, moderate pain, fever or headache.   atorvastatin 20 MG tablet Commonly known as: LIPITOR Take 1 tablet (20 mg total) by mouth daily. What changed: when to take this   donepezil 10 MG tablet Commonly known as: ARICEPT Take 1 tablet (10 mg total) by mouth at bedtime.   dorzolamide 2 % ophthalmic  solution Commonly known as: TRUSOPT Place 1 drop into both eyes 2 (two) times daily.   feeding supplement (PRO-STAT SUGAR FREE 64) Liqd Take 30 mLs by mouth 3 (three) times daily with meals.   finasteride 5 MG tablet Commonly known as: PROSCAR Take 5 mg by mouth every evening.   furosemide 20 MG tablet Commonly known as: LASIX Take 1 tablet (20 mg total) by mouth 2 (two) times daily. What changed:   medication strength  how much to take  how to take this  when to take this  additional instructions   HYDROcodone-acetaminophen 5-325 MG tablet Commonly known as: NORCO/VICODIN Take 1 tablet by mouth every 4 (four) hours as needed for moderate pain.   insulin aspart 100 UNIT/ML injection Commonly known as: novoLOG Inject 2-9 Units into the skin See admin instructions. Per Sliding Scale three times daily and at bedtime. 101-150 2 units 151-200 3 units 201-250 5 units 251-300 7 units 301-350 9 units > 350 11 units   insulin glargine 100 UNIT/ML injection Commonly known as: LANTUS Inject 10 Units into the skin 2 (two) times daily.   Melatonin 3 MG Tabs Take 3 mg by mouth at bedtime.   multivitamin with minerals Tabs tablet Take 1 tablet by mouth daily.   neomycin-bacitracin-polymyxin [phone] ointment Apply 1 application topically daily. Left lateral arm skin tear   omeprazole 20 MG capsule Commonly known as: PRILOSEC Take 20 mg by mouth every morning.   potassium chloride SA 20 MEQ tablet Commonly known as: KLOR-CON Take 20 mEq by mouth daily.   rivaroxaban 20 MG  Tabs tablet Commonly known as: XARELTO Take 1 tablet (20 mg total) by mouth daily with supper. What changed: when to take this   sertraline 25 MG tablet Commonly known as: ZOLOFT Take 25 mg by mouth daily.   tamsulosin 0.4 MG Caps capsule Commonly known as: FLOMAX Take 0.4 mg by mouth daily with breakfast.   Vitamin D (Ergocalciferol) 1.25 MG (50000 UNIT) Caps capsule Commonly known as:  DRISDOL Take 50,000 Units by mouth every Wednesday.       Contact information for follow-up providers    Newt Minion, MD In 1 week.   Specialty: Orthopedic Surgery Contact information: [place] [address] [phone]        Burnard Bunting, MD. Schedule an appointment as soon as possible for a visit in 1 week(s).   Specialty: Internal Medicine Contact information: [place] [address] [phone]        Donato Heinz, MD .   Specialty: Nephrology Contact information: [place] [address] [phone]            Contact information for after-discharge care    Destination    [facility] .   Service: Skilled Nursing Contact information: [place] [phone]                 Allergies  Allergen Reactions  . Ciprofloxacin Diarrhea  . Neurontin [Gabapentin]     Hallucinations/altered mental status  . Oxycodone Other (See Comments)    Altered mental status    Consultations:  Orthopedic surgery  Critical care  Palliative care  Nephrology   Procedures/Studies: DG Orbits  Result Date: 09-24-2019 CLINICAL DATA:  Initial evaluation for MRI clearance. EXAM: ORBITS - COMPLETE 4+ VIEW COMPARISON:  None. FINDINGS: There is no evidence of fracture or other significant bone abnormality. No orbital emphysema or sinus air-fluid levels are seen. IMPRESSION: Negative.  No metallic foreign body seen overlying either orbit. Electronically Signed   By: Jeannine Boga M.D.   On: 2019-09-24 00:47   CT HEAD WO CONTRAST  Result Date: 09/17/2019 CLINICAL DATA:  Encephalopathy EXAM: CT HEAD WITHOUT CONTRAST TECHNIQUE: Contiguous axial images were obtained from the base of the skull through the vertex without intravenous contrast. COMPARISON:  Correlation made with MRI from 2017 FINDINGS: Brain: There is no acute intracranial  hemorrhage, mass-effect, or edema. Gray-white differentiation is preserved. Confluent areas of hypoattenuation in the supratentorial white matter are nonspecific but probably reflect moderate to advanced chronic microvascular ischemic changes. There are small cortical right frontal and parietal cortical infarcts. There is no extra-axial fluid collection. Prominence of ventricles and sulci reflects generalized parenchymal volume loss. Vascular: There is atherosclerotic calcification at the skull base. Skull: Calvarium is unremarkable. Sinuses/Orbits: No acute finding. Other: None. IMPRESSION: No acute intracranial abnormality. Moderate to advanced chronic microvascular ischemic changes. Small chronic right frontoparietal cortical infarcts. Electronically Signed   By: Macy Mis M.D.   On: 09/17/2019 18:11   CT CHEST WO CONTRAST  Result Date: 09/12/2019 CLINICAL DATA:  Chronic respiratory failure EXAM: CT CHEST WITHOUT CONTRAST TECHNIQUE: Multidetector CT imaging of the chest was performed following the standard protocol without IV contrast. COMPARISON:  Chest x-ray from earlier in the same day, CT from 11/16/2018 FINDINGS: Cardiovascular: Somewhat limited due to lack of IV contrast. Aortic calcifications are seen without aneurysmal dilatation. Heavy coronary calcifications are noted. Cardiac shadow is at the upper limits of normal in size. Mediastinum/Nodes: Thoracic inlet  shows a hypodense lesion within right lobe of the thyroid similar to that noted on the prior exam. Scattered mediastinal adenopathy is noted most marked in the right paratracheal region similar to that seen on the prior exam. Scattered smaller mediastinal nodes are seen. Evaluation for hilar adenopathy is limited due to the lack of IV contrast. The esophagus appears within normal limits. Lungs/Pleura: Volume loss is noted on the right with mediastinal shift to the right. Small right-sided pleural effusion is noted but significantly decreased  when compared with the prior CT examination. This may have a more loculated chronic component present. Prominent soft tissue component is seen which measures approximately 3.1 cm in greatest dimension decreased from 4.4 cm on the prior exam. The lower lobe rounded area of atelectatic change is again identified and relatively stable. No new focal mass is seen. Large left pleural effusion is seen with associated atelectatic change in the lower lobe. There is a vague area of increased density identified along the lateral aspect of the left hemithorax with extension into the intercostal space between the eighth and ninth ribs on the left. This was not seen on the prior exam and the possibility of a focal mass could not be totally excluded. Upper Abdomen: Visualized upper abdomen shows no acute abnormality. Musculoskeletal: Degenerative changes of the thoracic spine are seen. No acute rib abnormality is noted. 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 IMPRESSION: Large left pleural effusion with associated basilar atelectasis. There is an area of soft tissue density which appears to extend into the inter costal space between the eighth and ninth ribs laterally. The possibility of underlying mass deserves consideration and thoracentesis with cytology evaluation is recommended. Persistent rounded densities within the right middle and right lower lobe when compared with the prior exam. The overall appearance has decreased somewhat in the interval and these again likely represent areas of rounded atelectasis and scarring. Small loculated right pleural effusion is noted. Stable right thyroid nodule measuring approximately 1 cm. No followup recommended (ref: J Am Coll Radiol. 2015 Feb;12(2): 143-50). Aortic Atherosclerosis (ICD10-I70.0). Electronically Signed   By: Inez Catalina M.D.   On: 09/12/2019 17:17   MR HEEL LEFT W WO CONTRAST  Result Date: 09/10/2019 CLINICAL DATA:  Left heel wound, diabetes EXAM: MRI OF LOWER  LEFT EXTREMITY WITHOUT AND WITH CONTRAST TECHNIQUE: Multiplanar, multisequence MR imaging of the left hindfoot was performed both before and after administration of intravenous contrast. CONTRAST:  37mL GADAVIST GADOBUTROL 1 MMOL/ML IV SOLN COMPARISON:  X-ray 09/09/2019 FINDINGS: Bones/Joint/Cartilage Loss of cortical definition of the posterolateral aspect of the left calcaneus with extensive bone marrow edema and enhancement. There is confluent low T1 signal compatible with acute osteomyelitis (series 6, images 24-28; series 12, image 14). The abnormal marrow signal is confined to the posterolateral calcaneus and measures up to 1 cm in AP depth with a thin amount of abnormal signal extending along the lateral aspect of the posterior calcaneus closely approximating the peroneal tubercle (series 4, image 24). There is involvement of the lateral most aspect of the distal Achilles tendon insertion (series 4, image 23) as well as the attachment of the plantar fascia (series 13, image 14). The remaining marrow signal of the calcaneal body is preserved. Remaining marrow structures of the hindfoot are intact. There are no fractures. Scattered degenerative changes involving the midfoot and subtalar joints. No talar dome chondral defect. No tibiotalar or subtalar joint effusion. Ligaments Medial and lateral ankle ligaments are grossly intact. Muscles and Tendons  Diffuse intramuscular edema which may reflect denervation changes versus myositis. Achilles tendon intact. Peroneal tendons, posteromedial ankle tendons, and anterior ankle tendons are intact. Plantar fascia remains intact. Soft tissues Large soft tissue ulceration overlying the posterolateral aspect of the calcaneus closely approximating the underlying bone. There is poorly enhancing tissue in this region compatible with tissue necrosis (series 19, image 17). Marked circumferential subcutaneous edema without a well-defined fluid collection. IMPRESSION: 1. Large  soft tissue ulceration overlying the posterior calcaneus with associated acute osteomyelitis of the posterolateral aspect of the left calcaneus. Abnormal marrow signal involves the lateral most aspect of the distal Achilles tendon insertion as well as the attachment of the plantar fascia. 2. Marked circumferential subcutaneous edema without a well-defined fluid collection. 3. Diffuse intramuscular edema which may reflect denervation changes versus myositis. Electronically Signed   By: Davina Poke D.O.   On: 09/10/2019 09:13   MR HEEL RIGHT W WO CONTRAST  Result Date: 09/10/2019 CLINICAL DATA:  Right heel ulcer, diabetic, peripheral vascular disease EXAM: MR OF THE RIGHT HEEL WITHOUT AND WITH CONTRAST TECHNIQUE: Multiplanar, multisequence MR imaging of the right heel was performed both before and after administration of intravenous contrast. CONTRAST:  73mL GADAVIST GADOBUTROL 1 MMOL/ML IV SOLN COMPARISON:  None available. FINDINGS: Bones/Joint/Cartilage No acute fracture or malalignment. Small os trigonum with associated marrow edema. Remaining visualized marrow structures are within normal limits without abnormal bone marrow signal. No cortical destruction. Tibiotalar joint is intact without chondral defect or joint effusion. Subtalar joints are intact without chondral defect or joint effusion. Ligaments The medial and lateral ankle ligaments are intact. Lisfranc ligament intact. Muscles and Tendons Diffuse muscular atrophy and fatty infiltration. Diffuse edema like signal throughout the visualized musculature suggesting denervation changes or myositis. Achilles tendon intact. Peroneal tendons intact. Posteromedial and anterior ankle tendons are intact. Soft tissues There are foci of metallic susceptibility artifact adjacent to the plantar cortex of the posterior calcaneus (series 5, image 10). Circumferential soft tissue edema and enhancement suggesting cellulitis. No deep soft tissue ulceration. No  well-defined or drainable soft tissue fluid collection. IMPRESSION: 1. Circumferential soft tissue edema and enhancement suggesting cellulitis. No well-defined or drainable soft tissue fluid collection. 2. No evidence of osteomyelitis. 3. Os trigonum with associated mild marrow edema, which can be seen in the setting of posterior ankle impingement. 4. Diffuse edema-like signal throughout the visualized musculature suggesting denervation changes or myositis. 5. Small foci of susceptibility artifact adjacent to the plantar cortex of the posterior calcaneus, suggesting small metallic foreign bodies. Radiographic correlation would be helpful for further evaluation if clinically indicated. Electronically Signed   By: Davina Poke D.O.   On: 09/10/2019 17:25   US Renal  Result Date: 09/17/2019 CLINICAL DATA:  Acute renal failure EXAM: RENAL / URINARY TRACT ULTRASOUND COMPLETE COMPARISON:  CT of the pelvis dated 10/20/2018 FINDINGS: Right Kidney: Renal measurements: 10.6 x 5.4 x 5.3 cm = volume: 159 mL. There is no hydronephrosis. There is increased cortical echogenicity. Left Kidney: Renal measurements: 12.8 x 6.6 x 5.7 cm 252 = volume: 252 mL. There is no hydronephrosis. There is increased cortical echogenicity. Bladder: Appears normal for degree of bladder distention. Both ureteral jets were visualized. Other: None. IMPRESSION: 1. No acute abnormality.  No evidence for hydronephrosis. 2. Echogenic kidneys bilaterally which can be seen in patients with medical renal disease. Electronically Signed   By: Constance Holster M.D.   On: 09/17/2019 19:30   DG CHEST PORT 1 VIEW  Result Date: 09/17/2019 CLINICAL DATA:  In cephalopathy EXAM: PORTABLE CHEST 1 VIEW COMPARISON:  09/12/2019 FINDINGS: Moderate bilateral pleural effusions, increasing on the left since prior study. Bilateral lower lobe airspace opacities. Cardiomegaly, vascular congestion. Multiple old left rib fractures. IMPRESSION: Moderate bilateral pleural  effusions with bibasilar atelectasis or infiltrates, worsening on the left since prior study. Cardiomegaly, vascular congestion. Electronically Signed   By: Rolm Baptise M.D.   On: 09/17/2019 20:25   DG CHEST PORT 1 VIEW  Result Date: 09/12/2019 CLINICAL DATA:  Status post thoracentesis. EXAM: PORTABLE CHEST 1 VIEW COMPARISON:  September 10, 2019 FINDINGS: There is no pneumothorax. There is persistent, likely loculated right-sided pleural effusion. The heart size is mildly enlarged. Interstitial edema is again noted. The left-sided pleural effusion has improved from prior study. Aortic calcifications are noted. There is no acute osseous abnormality. IMPRESSION: 1. No pneumothorax. 2. Persistent moderate-sized loculated right-sided pleural effusion. Small left-sided pleural effusion. 3. Bilateral airspace disease which may represent atelectasis or consolidation, greatest within the right lung base. Electronically Signed   By: Constance Holster M.D.   On: 09/12/2019 15:10   DG CHEST PORT 1 VIEW  Result Date: 09/10/2019 CLINICAL DATA:  Right pleural effusion. EXAM: PORTABLE CHEST 1 VIEW COMPARISON:  Chest radiograph 08/20/2019 FINDINGS: Stable cardiomediastinal contours with enlarged heart size. Mild diffuse bilateral interstitial opacities. Stable to slight increase in size of a loculated moderate right pleural effusion. Persistent right basilar opacity. Mild opacities at the left lung base may reflect atelectasis and or infiltrate with likely small volume pleural fluid. No evidence of pneumothorax IMPRESSION: 1. Stable to slight increase in size of a loculated moderate right pleural effusion. 2. Diffuse mild interstitial opacities suspect mild edema. 3. Persistent bibasilar pulmonary opacities. Electronically Signed   By: Audie Pinto M.D.   On: 09/10/2019 11:43   DG Foot Complete Left  Result Date: 09/09/2019 CLINICAL DATA:  Left heel wound. Concern for osteomyelitis. Recent left hip fracture. EXAM: LEFT  FOOT - COMPLETE 3+ VIEW COMPARISON:  None. FINDINGS: Presumed surgical absence of the second phalanx. Vascular calcifications. Lucency superficial to the calcaneus, likely the site of soft tissue ulcer. No soft tissue gas. No radiopaque foreign object. No osseous destruction. Diffuse soft tissue swelling about the dorsal forefoot. IMPRESSION: No plain film evidence of osteomyelitis. Forefoot soft tissue swelling. Electronically Signed   By: Abigail Miyamoto M.D.   On: 09/09/2019 17:00   VAS [place] ABI WITH/WO TBI  Result Date: 09/11/2019 LOWER EXTREMITY DOPPLER STUDY Indications: Ulceration, and gangrene.  Comparison Study: No prior study. Performing Technologist: Sharion Dove RVS  Examination Guidelines: A complete evaluation includes at minimum, Doppler waveform signals and systolic blood pressure reading at the level of bilateral brachial, anterior tibial, and posterior tibial arteries, when vessel segments are accessible. Bilateral testing is considered an integral part of a complete examination. Photoelectric Plethysmograph (PPG) waveforms and toe systolic pressure readings are included as required and additional duplex testing as needed. Limited examinations for reoccurring indications may be performed as noted.  ABI Findings: +---------+------------------+-----+---------+--------+ Right    Rt Pressure (mmHg)IndexWaveform Comment  +---------+------------------+-----+---------+--------+ Brachial 127                    triphasic         +---------+------------------+-----+---------+--------+ PTA      136               1.07 biphasic          +---------+------------------+-----+---------+--------+ DP       156  1.23 biphasic          +---------+------------------+-----+---------+--------+ Great Toe79                0.62                   +---------+------------------+-----+---------+--------+ +---------+------------------+-----+---------+-------+ Left     Lt Pressure  (mmHg)IndexWaveform Comment +---------+------------------+-----+---------+-------+ Brachial 124                    triphasic        +---------+------------------+-----+---------+-------+ PTA      112               0.88 biphasic         +---------+------------------+-----+---------+-------+ DP       159               1.25 biphasic         +---------+------------------+-----+---------+-------+ Great Toe70                0.55                  +---------+------------------+-----+---------+-------+ +-------+-----------+-----------+------------+------------+ ABI/TBIToday's ABIToday's TBIPrevious ABIPrevious TBI +-------+-----------+-----------+------------+------------+ Right  1.23       0.62                                +-------+-----------+-----------+------------+------------+ Left   1.25       0.55                                +-------+-----------+-----------+------------+------------+  Summary: Right: Resting right ankle-brachial index is within normal range. No evidence of significant right lower extremity arterial disease. The right toe-brachial index is abnormal. Left: Resting left ankle-brachial index is within normal range. No evidence of significant left lower extremity arterial disease. The left toe-brachial index is abnormal.  *See table(s) above for measurements and observations.  Electronically signed by Deitra Mayo MD on 09/11/2019 at 5:04:43 PM.   Final        Discharge Exam: Vitals:   09/22/19 0729 09/22/19 0928  BP:  139/77  Pulse:  85  Resp:  14  Temp:  98.1 F (36.7 C)  SpO2: 98% 98%    General: Pt is alert, awake, not in acute distress Cardiovascular: RRR, S1/S2 +, no edema Respiratory: CTA bilaterally, no wheezing, no rhonchi, no respiratory distress, no conversational dyspnea  Abdominal: Soft, NT, ND, bowel sounds + Extremities: Left BKA Psych: Normal mood and affect, stable judgement and insight     The results of  significant diagnostics from this hospitalization (including imaging, microbiology, ancillary and laboratory) are listed below for reference.     Microbiology: Recent Results (from the past 240 hour(s))  Body fluid culture (includes gram stain)     Status: None   Collection Time: 09/12/19  2:54 PM   Specimen: Pleural Fluid  Result Value Ref Range Status   Specimen Description PLEURAL  Final   Special Requests Normal  Final   Gram Stain NO WBC SEEN NO ORGANISMS SEEN   Final   Culture   Final    NO GROWTH 3 DAYS Performed at [facility], [address], [place], [place] [address]    Report Status 09/15/2019 FINAL  Final  SARS CORONAVIRUS 2 (TAT 6-24 HRS) Nasopharyngeal Nasopharyngeal Swab     Status: None   Collection Time: 09/21/19  1:00  PM   Specimen: Nasopharyngeal Swab  Result Value Ref Range Status   SARS Coronavirus 2 NEGATIVE NEGATIVE Final    Comment: (NOTE) SARS-CoV-2 target nucleic acids are NOT DETECTED. The SARS-CoV-2 RNA is generally detectable in upper and lower respiratory specimens during the acute phase of infection. Negative results do not preclude SARS-CoV-2 infection, do not rule out co-infections with other pathogens, and should not be used as the sole basis for treatment or other patient management decisions. Negative results must be combined with clinical observations, patient history, and epidemiological information. The expected result is Negative. Fact Sheet for Patients: SugarRoll.be Fact Sheet for Healthcare Providers: https://www.woods-mathews.com/ This test is not yet approved or cleared by the [place] FDA and  has been authorized for detection and/or diagnosis of SARS-CoV-2 by FDA under an Emergency Use Authorization (EUA). This EUA will remain  in effect (meaning this test can be used) for the duration of the COVID-19 declaration under Section 56 4(b)(1) of the Act, 21 U.S.C. section  360bbb-3(b)(1), unless the authorization is terminated or revoked sooner. Performed at [facility], [place] [address], [place], [place] [address]      Labs: BNP (last 3 results) No results for input(s): BNP in the last 8760 hours. Basic Metabolic Panel: Recent Labs  Lab 09/18/19 1544 09/18/19 1544 09/18/19 2200 09/19/19 0608 09/20/19 0320 09/21/19 0235 09/22/19 0607  NA 134*  --   --  137 135 137 137  K 6.0*   < > 4.8 4.5 4.4 4.2 3.8  CL 102  --   --  102 102 104 102  CO2 25  --   --  24 23 24 26   GLUCOSE 426*  --   --  342* 330* 327* 289*  BUN 89*  --   --  90* 98* 98* 102*  CREATININE 2.74*  --   --  2.72* 2.65* 2.50* 2.62*  CALCIUM 7.9*  --   --  8.3* 8.2* 8.4* 8.3*  PHOS 4.7*  --   --  4.5 3.7 3.8 3.6   < > = values in this interval not displayed.   Liver Function Tests: Recent Labs  Lab 09/18/19 1544 09/19/19 0608 09/20/19 0320 09/21/19 0235 09/22/19 0607  ALBUMIN 2.2* 2.4* 2.1* 2.2* 2.1*   No results for input(s): LIPASE, AMYLASE in the last 168 hours. Recent Labs  Lab 09/17/19 1657 09/18/19 0918 09/19/19 0608 09/21/19 0235  AMMONIA 37* 57* 20 36*   CBC: Recent Labs  Lab 09/16/19 0124 09/17/19 1657 09/18/19 0918 09/19/19 0608 09/22/19 0607  WBC 7.9  --  6.8 7.3 8.0  NEUTROABS  --   --  6.3  --   --   HGB 10.4*  --  10.9* 11.3* 11.4*  HCT 38.5* 33.6* 38.7* 39.6 39.1  MCV 89.1  --  87.4 86.3 84.1  PLT 190  --  162 164 201   Cardiac Enzymes: No results for input(s): CKTOTAL, CKMB, CKMBINDEX, TROPONINI in the last 168 hours. BNP: Invalid input(s): POCBNP CBG: Recent Labs  Lab 09/21/19 0633 09/21/19 1109 09/21/19 1627 09/21/19 2118 09/22/19 0630  GLUCAP 279* 318* 360* 280* 289*   D-Dimer No results for input(s): DDIMER in the last 72 hours. Hgb A1c No results for input(s): HGBA1C in the last 72 hours. Lipid Profile No results for input(s): CHOL, HDL, LDLCALC, TRIG, CHOLHDL, LDLDIRECT in the last 72 hours. Thyroid function  studies No results for input(s): TSH, T4TOTAL, T3FREE, THYROIDAB in the last 72 hours.  Invalid input(s):  FREET3 Anemia work up No results for input(s): VITAMINB12, FOLATE, FERRITIN, TIBC, IRON, RETICCTPCT in the last 72 hours. Urinalysis    Component Value Date/Time   COLORURINE YELLOW 09/17/2019 1249   APPEARANCEUR HAZY (A) 09/17/2019 1249   LABSPEC 1.015 09/17/2019 1249   PHURINE 5.0 09/17/2019 1249   GLUCOSEU NEGATIVE 09/17/2019 1249   HGBUR NEGATIVE 09/17/2019 1249   BILIRUBINUR NEGATIVE 09/17/2019 1249   KETONESUR NEGATIVE 09/17/2019 1249   PROTEINUR NEGATIVE 09/17/2019 1249   NITRITE NEGATIVE 09/17/2019 1249   LEUKOCYTESUR LARGE (A) 09/17/2019 1249   Sepsis Labs Invalid input(s): PROCALCITONIN,  WBC,  LACTICIDVEN Microbiology Recent Results (from the past 240 hour(s))  Body fluid culture (includes gram stain)     Status: None   Collection Time: 09/12/19  2:54 PM   Specimen: Pleural Fluid  Result Value Ref Range Status   Specimen Description PLEURAL  Final   Special Requests Normal  Final   Gram Stain NO WBC SEEN NO ORGANISMS SEEN   Final   Culture   Final    NO GROWTH 3 DAYS Performed at [facility], [address] [place], [place] [address]    Report Status 09/15/2019 FINAL  Final  SARS CORONAVIRUS 2 (TAT 6-24 HRS) Nasopharyngeal Nasopharyngeal Swab     Status: None   Collection Time: 09/21/19  1:00 PM   Specimen: Nasopharyngeal Swab  Result Value Ref Range Status   SARS Coronavirus 2 NEGATIVE NEGATIVE Final    Comment: (NOTE) SARS-CoV-2 target nucleic acids are NOT DETECTED. The SARS-CoV-2 RNA is generally detectable in upper and lower respiratory specimens during the acute phase of infection. Negative results do not preclude SARS-CoV-2 infection, do not rule out co-infections with other pathogens, and should not be used as the sole basis for treatment or other patient management decisions. Negative results must be combined with clinical  observations, patient history, and epidemiological information. The expected result is Negative. Fact Sheet for Patients: SugarRoll.be Fact Sheet for Healthcare Providers: https://www.woods-mathews.com/ This test is not yet approved or cleared by the [place] FDA and  has been authorized for detection and/or diagnosis of SARS-CoV-2 by FDA under an Emergency Use Authorization (EUA). This EUA will remain  in effect (meaning this test can be used) for the duration of the COVID-19 declaration under Section 56 4(b)(1) of the Act, 21 U.S.C. section 360bbb-3(b)(1), unless the authorization is terminated or revoked sooner. Performed at [facility], [place] [address], [place], [place] [address]      Patient was seen and examined on the day of discharge and was found to be in stable condition. Time coordinating discharge: 35 minutes including assessment and coordination of care, as well as examination of the patient.   SIGNED:  Dessa Phi, DO [facility] 09/22/2019, 9:33 AM

## 2019-09-22 NOTE — Progress Notes (Signed)
D/C instructions reviewed with patient and caregiver. IV removed, clean and intact. Report called to [facility]. Pt to be escorted to facility via [facility].  Clyde Canterbury, RN

## 2019-09-22 NOTE — Progress Notes (Signed)
Pt discharged via [facility]. Vitals stable, evening medication given. All pt's belonging sent with him.

## 2019-09-22 NOTE — Care Management Important Message (Signed)
Important Message  Patient Details  Name: Ravis Herne MRN: [id number] Date of Birth: 01-16-1941   Medicare Important Message Given:  Yes     Shelda Altes 09/22/2019, 1:03 PM

## 2019-09-22 NOTE — TOC Transition Note (Addendum)
Transition of Care [facility]) - CM/SW Discharge Note   Patient Details  Name: Darrell Mcclain MRN: [id number] Date of Birth: [birth year]  Transition of Care [facility]) CM/SW Contact:  Vinie Sill, [place] Phone Number: 09/22/2019, 11:34 AM   Clinical Narrative:     Patient will DC to: [facility]  DC Date: 09/22/2019 Family Notified:[name],daughter Transport HV:[facility] @ 4pm  Per Md patient is ready for discharge. RN, patient, and facility notified of DC. Discharge Summary sent to facility.  RN given number for report [phone]. Ambulance transport requested for patient.   Clinical Social Worker signing off. Thurmond Butts, MSW, [place] Clinical Social Worker    Final next level of care: Skilled Nursing Facility Barriers to Discharge: Barriers Resolved   Patient Goals and CMS Choice        Discharge Placement PASRR number recieved: 09/20/19            Patient chooses bed at: [facility] Patient to be transferred to facility by: [place] Name of family member notified: [name],dcaughter Patient and family notified of of transfer: 09/22/19  Discharge Plan and Services In-house Referral: Clinical Social Work                                   Social Determinants of Health ([place]) Interventions     Readmission Risk Interventions No flowsheet data found.

## 2019-09-26 ENCOUNTER — Telehealth: Payer: Self-pay | Admitting: Family

## 2019-09-26 NOTE — Telephone Encounter (Signed)
Darrell Mcclain from [facility] called.   They are experiencing a COVID outbreak in the facility but the patient tested negative. They also do weekly testing with the next testing date being this Thursday. They would like a call back advising them on what to do about the patient's next appointment.   Call back number: [phone]

## 2019-09-26 NOTE — Telephone Encounter (Signed)
Dr. [name] has been made aware that wound care nurse will contact him with photos of pt's limb after eval today in SNF

## 2019-09-26 NOTE — Telephone Encounter (Signed)
Pt is s/p a BKA and is currently at [facility]. They called and state that there is an outbreak at the facility with over 30 + patients and did not want to bring the pt to our facility. Advised to have the wound care nurse remove the wound vac and take a picture of the incision today and send this to Dr. [name] for review. We will call with instructions after this has been received.

## 2019-09-28 ENCOUNTER — Inpatient Hospital Stay: Payer: Medicare Other | Admitting: Family

## 2019-10-02 ENCOUNTER — Other Ambulatory Visit: Payer: Self-pay

## 2019-10-02 DIAGNOSIS — E538 Deficiency of other specified B group vitamins: Secondary | ICD-10-CM | POA: Diagnosis present

## 2019-10-02 DIAGNOSIS — L719 Rosacea, unspecified: Secondary | ICD-10-CM | POA: Diagnosis present

## 2019-10-02 DIAGNOSIS — E11649 Type 2 diabetes mellitus with hypoglycemia without coma: Secondary | ICD-10-CM | POA: Diagnosis not present

## 2019-10-02 DIAGNOSIS — U071 COVID-19: Secondary | ICD-10-CM | POA: Diagnosis not present

## 2019-10-02 DIAGNOSIS — E1169 Type 2 diabetes mellitus with other specified complication: Secondary | ICD-10-CM | POA: Diagnosis present

## 2019-10-02 DIAGNOSIS — N184 Chronic kidney disease, stage 4 (severe): Secondary | ICD-10-CM | POA: Diagnosis present

## 2019-10-02 DIAGNOSIS — M1990 Unspecified osteoarthritis, unspecified site: Secondary | ICD-10-CM | POA: Diagnosis present

## 2019-10-02 DIAGNOSIS — Z79899 Other long term (current) drug therapy: Secondary | ICD-10-CM

## 2019-10-02 DIAGNOSIS — M869 Osteomyelitis, unspecified: Secondary | ICD-10-CM | POA: Diagnosis present

## 2019-10-02 DIAGNOSIS — Z515 Encounter for palliative care: Secondary | ICD-10-CM | POA: Diagnosis not present

## 2019-10-02 DIAGNOSIS — L89616 Pressure-induced deep tissue damage of right heel: Secondary | ICD-10-CM | POA: Diagnosis present

## 2019-10-02 DIAGNOSIS — H9190 Unspecified hearing loss, unspecified ear: Secondary | ICD-10-CM | POA: Diagnosis present

## 2019-10-02 DIAGNOSIS — Z87891 Personal history of nicotine dependence: Secondary | ICD-10-CM

## 2019-10-02 DIAGNOSIS — E785 Hyperlipidemia, unspecified: Secondary | ICD-10-CM | POA: Diagnosis present

## 2019-10-02 DIAGNOSIS — Z6834 Body mass index (BMI) 34.0-34.9, adult: Secondary | ICD-10-CM

## 2019-10-02 DIAGNOSIS — J1282 Pneumonia due to coronavirus disease 2019: Secondary | ICD-10-CM | POA: Diagnosis present

## 2019-10-02 DIAGNOSIS — Z801 Family history of malignant neoplasm of trachea, bronchus and lung: Secondary | ICD-10-CM

## 2019-10-02 DIAGNOSIS — Z9981 Dependence on supplemental oxygen: Secondary | ICD-10-CM

## 2019-10-02 DIAGNOSIS — K5909 Other constipation: Secondary | ICD-10-CM | POA: Diagnosis present

## 2019-10-02 DIAGNOSIS — N179 Acute kidney failure, unspecified: Secondary | ICD-10-CM | POA: Diagnosis present

## 2019-10-02 DIAGNOSIS — Z808 Family history of malignant neoplasm of other organs or systems: Secondary | ICD-10-CM

## 2019-10-02 DIAGNOSIS — J9601 Acute respiratory failure with hypoxia: Secondary | ICD-10-CM | POA: Diagnosis not present

## 2019-10-02 DIAGNOSIS — Z66 Do not resuscitate: Secondary | ICD-10-CM | POA: Diagnosis present

## 2019-10-02 DIAGNOSIS — R339 Retention of urine, unspecified: Secondary | ICD-10-CM | POA: Diagnosis present

## 2019-10-02 DIAGNOSIS — Z7901 Long term (current) use of anticoagulants: Secondary | ICD-10-CM

## 2019-10-02 DIAGNOSIS — I480 Paroxysmal atrial fibrillation: Secondary | ICD-10-CM | POA: Diagnosis present

## 2019-10-02 DIAGNOSIS — E1165 Type 2 diabetes mellitus with hyperglycemia: Secondary | ICD-10-CM | POA: Diagnosis present

## 2019-10-02 DIAGNOSIS — Z8673 Personal history of transient ischemic attack (TIA), and cerebral infarction without residual deficits: Secondary | ICD-10-CM

## 2019-10-02 DIAGNOSIS — I130 Hypertensive heart and chronic kidney disease with heart failure and stage 1 through stage 4 chronic kidney disease, or unspecified chronic kidney disease: Secondary | ICD-10-CM | POA: Diagnosis present

## 2019-10-02 DIAGNOSIS — K219 Gastro-esophageal reflux disease without esophagitis: Secondary | ICD-10-CM | POA: Diagnosis present

## 2019-10-02 DIAGNOSIS — Z86010 Personal history of colonic polyps: Secondary | ICD-10-CM

## 2019-10-02 DIAGNOSIS — E1122 Type 2 diabetes mellitus with diabetic chronic kidney disease: Secondary | ICD-10-CM | POA: Diagnosis present

## 2019-10-02 DIAGNOSIS — J9621 Acute and chronic respiratory failure with hypoxia: Secondary | ICD-10-CM | POA: Diagnosis present

## 2019-10-02 DIAGNOSIS — H409 Unspecified glaucoma: Secondary | ICD-10-CM | POA: Diagnosis present

## 2019-10-02 DIAGNOSIS — N400 Enlarged prostate without lower urinary tract symptoms: Secondary | ICD-10-CM | POA: Diagnosis present

## 2019-10-02 DIAGNOSIS — G931 Anoxic brain damage, not elsewhere classified: Secondary | ICD-10-CM | POA: Diagnosis present

## 2019-10-02 DIAGNOSIS — Z794 Long term (current) use of insulin: Secondary | ICD-10-CM

## 2019-10-02 DIAGNOSIS — L89152 Pressure ulcer of sacral region, stage 2: Secondary | ICD-10-CM | POA: Diagnosis present

## 2019-10-02 DIAGNOSIS — G9341 Metabolic encephalopathy: Secondary | ICD-10-CM | POA: Diagnosis present

## 2019-10-02 DIAGNOSIS — Z8546 Personal history of malignant neoplasm of prostate: Secondary | ICD-10-CM

## 2019-10-02 DIAGNOSIS — F0150 Vascular dementia without behavioral disturbance: Secondary | ICD-10-CM | POA: Diagnosis present

## 2019-10-02 DIAGNOSIS — I4892 Unspecified atrial flutter: Secondary | ICD-10-CM | POA: Diagnosis present

## 2019-10-02 DIAGNOSIS — D631 Anemia in chronic kidney disease: Secondary | ICD-10-CM | POA: Diagnosis present

## 2019-10-02 DIAGNOSIS — I5033 Acute on chronic diastolic (congestive) heart failure: Secondary | ICD-10-CM | POA: Diagnosis not present

## 2019-10-02 DIAGNOSIS — I21A1 Myocardial infarction type 2: Secondary | ICD-10-CM | POA: Diagnosis present

## 2019-10-02 DIAGNOSIS — R600 Localized edema: Secondary | ICD-10-CM | POA: Diagnosis present

## 2019-10-02 DIAGNOSIS — E1151 Type 2 diabetes mellitus with diabetic peripheral angiopathy without gangrene: Secondary | ICD-10-CM | POA: Diagnosis present

## 2019-10-02 DIAGNOSIS — Z888 Allergy status to other drugs, medicaments and biological substances status: Secondary | ICD-10-CM

## 2019-10-02 DIAGNOSIS — Z89512 Acquired absence of left leg below knee: Secondary | ICD-10-CM

## 2019-10-02 DIAGNOSIS — I4510 Unspecified right bundle-branch block: Secondary | ICD-10-CM | POA: Diagnosis present

## 2019-10-02 DIAGNOSIS — Z885 Allergy status to narcotic agent status: Secondary | ICD-10-CM

## 2019-10-03 ENCOUNTER — Emergency Department (HOSPITAL_COMMUNITY): Payer: Medicare Other

## 2019-10-03 ENCOUNTER — Inpatient Hospital Stay (HOSPITAL_COMMUNITY)
Admission: EM | Admit: 2019-10-03 | Discharge: 2019-10-10 | DRG: 177 | Disposition: E | Payer: Medicare Other | Source: Skilled Nursing Facility | Attending: Family Medicine | Admitting: Family Medicine

## 2019-10-03 ENCOUNTER — Inpatient Hospital Stay (HOSPITAL_COMMUNITY): Payer: Medicare Other

## 2019-10-03 ENCOUNTER — Encounter (HOSPITAL_COMMUNITY): Payer: Self-pay | Admitting: Internal Medicine

## 2019-10-03 DIAGNOSIS — J90 Pleural effusion, not elsewhere classified: Secondary | ICD-10-CM | POA: Diagnosis not present

## 2019-10-03 DIAGNOSIS — I5032 Chronic diastolic (congestive) heart failure: Secondary | ICD-10-CM | POA: Diagnosis not present

## 2019-10-03 DIAGNOSIS — E08649 Diabetes mellitus due to underlying condition with hypoglycemia without coma: Secondary | ICD-10-CM | POA: Diagnosis not present

## 2019-10-03 DIAGNOSIS — G9341 Metabolic encephalopathy: Secondary | ICD-10-CM | POA: Diagnosis present

## 2019-10-03 DIAGNOSIS — E1122 Type 2 diabetes mellitus with diabetic chronic kidney disease: Secondary | ICD-10-CM | POA: Diagnosis present

## 2019-10-03 DIAGNOSIS — Z66 Do not resuscitate: Secondary | ICD-10-CM | POA: Diagnosis present

## 2019-10-03 DIAGNOSIS — I480 Paroxysmal atrial fibrillation: Secondary | ICD-10-CM | POA: Diagnosis present

## 2019-10-03 DIAGNOSIS — N179 Acute kidney failure, unspecified: Secondary | ICD-10-CM

## 2019-10-03 DIAGNOSIS — E1165 Type 2 diabetes mellitus with hyperglycemia: Secondary | ICD-10-CM | POA: Diagnosis present

## 2019-10-03 DIAGNOSIS — I5033 Acute on chronic diastolic (congestive) heart failure: Secondary | ICD-10-CM | POA: Diagnosis not present

## 2019-10-03 DIAGNOSIS — J9601 Acute respiratory failure with hypoxia: Secondary | ICD-10-CM

## 2019-10-03 DIAGNOSIS — I4892 Unspecified atrial flutter: Secondary | ICD-10-CM | POA: Diagnosis present

## 2019-10-03 DIAGNOSIS — M869 Osteomyelitis, unspecified: Secondary | ICD-10-CM | POA: Diagnosis present

## 2019-10-03 DIAGNOSIS — I361 Nonrheumatic tricuspid (valve) insufficiency: Secondary | ICD-10-CM | POA: Diagnosis not present

## 2019-10-03 DIAGNOSIS — I351 Nonrheumatic aortic (valve) insufficiency: Secondary | ICD-10-CM | POA: Diagnosis not present

## 2019-10-03 DIAGNOSIS — U071 COVID-19: Secondary | ICD-10-CM

## 2019-10-03 DIAGNOSIS — J9621 Acute and chronic respiratory failure with hypoxia: Secondary | ICD-10-CM | POA: Diagnosis present

## 2019-10-03 DIAGNOSIS — J9611 Chronic respiratory failure with hypoxia: Secondary | ICD-10-CM | POA: Diagnosis present

## 2019-10-03 DIAGNOSIS — Z515 Encounter for palliative care: Secondary | ICD-10-CM

## 2019-10-03 DIAGNOSIS — I21A1 Myocardial infarction type 2: Secondary | ICD-10-CM | POA: Diagnosis present

## 2019-10-03 DIAGNOSIS — Z7901 Long term (current) use of anticoagulants: Secondary | ICD-10-CM | POA: Diagnosis not present

## 2019-10-03 DIAGNOSIS — K5909 Other constipation: Secondary | ICD-10-CM | POA: Diagnosis present

## 2019-10-03 DIAGNOSIS — Z89512 Acquired absence of left leg below knee: Secondary | ICD-10-CM | POA: Diagnosis not present

## 2019-10-03 DIAGNOSIS — E1169 Type 2 diabetes mellitus with other specified complication: Secondary | ICD-10-CM | POA: Diagnosis present

## 2019-10-03 DIAGNOSIS — R0902 Hypoxemia: Secondary | ICD-10-CM

## 2019-10-03 DIAGNOSIS — N400 Enlarged prostate without lower urinary tract symptoms: Secondary | ICD-10-CM | POA: Diagnosis present

## 2019-10-03 DIAGNOSIS — Z9981 Dependence on supplemental oxygen: Secondary | ICD-10-CM | POA: Diagnosis not present

## 2019-10-03 DIAGNOSIS — N184 Chronic kidney disease, stage 4 (severe): Secondary | ICD-10-CM | POA: Diagnosis present

## 2019-10-03 DIAGNOSIS — I340 Nonrheumatic mitral (valve) insufficiency: Secondary | ICD-10-CM | POA: Diagnosis not present

## 2019-10-03 DIAGNOSIS — IMO0002 Reserved for concepts with insufficient information to code with codable children: Secondary | ICD-10-CM | POA: Diagnosis present

## 2019-10-03 DIAGNOSIS — F0150 Vascular dementia without behavioral disturbance: Secondary | ICD-10-CM | POA: Diagnosis present

## 2019-10-03 DIAGNOSIS — I130 Hypertensive heart and chronic kidney disease with heart failure and stage 1 through stage 4 chronic kidney disease, or unspecified chronic kidney disease: Secondary | ICD-10-CM | POA: Diagnosis present

## 2019-10-03 DIAGNOSIS — R7989 Other specified abnormal findings of blood chemistry: Secondary | ICD-10-CM | POA: Diagnosis not present

## 2019-10-03 DIAGNOSIS — M1990 Unspecified osteoarthritis, unspecified site: Secondary | ICD-10-CM | POA: Diagnosis present

## 2019-10-03 DIAGNOSIS — I10 Essential (primary) hypertension: Secondary | ICD-10-CM | POA: Diagnosis not present

## 2019-10-03 DIAGNOSIS — J1282 Pneumonia due to coronavirus disease 2019: Secondary | ICD-10-CM | POA: Diagnosis present

## 2019-10-03 DIAGNOSIS — Z7189 Other specified counseling: Secondary | ICD-10-CM | POA: Diagnosis not present

## 2019-10-03 DIAGNOSIS — L8990 Pressure ulcer of unspecified site, unspecified stage: Secondary | ICD-10-CM | POA: Insufficient documentation

## 2019-10-03 DIAGNOSIS — G931 Anoxic brain damage, not elsewhere classified: Secondary | ICD-10-CM | POA: Diagnosis present

## 2019-10-03 LAB — URINALYSIS, ROUTINE W REFLEX MICROSCOPIC
Bilirubin Urine: NEGATIVE
Glucose, UA: NEGATIVE mg/dL
Hgb urine dipstick: NEGATIVE
Ketones, ur: NEGATIVE mg/dL
Leukocytes,Ua: NEGATIVE
Nitrite: NEGATIVE
Protein, ur: 30 mg/dL — AB
Specific Gravity, Urine: 1.013 (ref 1.005–1.030)
pH: 5 (ref 5.0–8.0)

## 2019-10-03 LAB — CBC WITH DIFFERENTIAL/PLATELET
Abs Immature Granulocytes: 0.07 10*3/uL (ref 0.00–0.07)
Basophils Absolute: 0 10*3/uL (ref 0.0–0.1)
Basophils Relative: 0 %
Eosinophils Absolute: 0 10*3/uL (ref 0.0–0.5)
Eosinophils Relative: 0 %
HCT: 40.3 % (ref 39.0–52.0)
Hemoglobin: 11.1 g/dL — ABNORMAL LOW (ref 13.0–17.0)
Immature Granulocytes: 1 %
Lymphocytes Relative: 4 %
Lymphs Abs: 0.3 10*3/uL — ABNORMAL LOW (ref 0.7–4.0)
MCH: 24.1 pg — ABNORMAL LOW (ref 26.0–34.0)
MCHC: 27.5 g/dL — ABNORMAL LOW (ref 30.0–36.0)
MCV: 87.6 fL (ref 80.0–100.0)
Monocytes Absolute: 0.3 10*3/uL (ref 0.1–1.0)
Monocytes Relative: 4 %
Neutro Abs: 7.9 10*3/uL — ABNORMAL HIGH (ref 1.7–7.7)
Neutrophils Relative %: 91 %
Platelets: 177 10*3/uL (ref 150–400)
RBC: 4.6 MIL/uL (ref 4.22–5.81)
RDW: 19.3 % — ABNORMAL HIGH (ref 11.5–15.5)
WBC: 8.6 10*3/uL (ref 4.0–10.5)
nRBC: 0 % (ref 0.0–0.2)

## 2019-10-03 LAB — GLUCOSE, CAPILLARY
Glucose-Capillary: 172 mg/dL — ABNORMAL HIGH (ref 70–99)
Glucose-Capillary: 252 mg/dL — ABNORMAL HIGH (ref 70–99)

## 2019-10-03 LAB — COMPREHENSIVE METABOLIC PANEL
ALT: 48 U/L — ABNORMAL HIGH (ref 0–44)
AST: 46 U/L — ABNORMAL HIGH (ref 15–41)
Albumin: 2.3 g/dL — ABNORMAL LOW (ref 3.5–5.0)
Alkaline Phosphatase: 162 U/L — ABNORMAL HIGH (ref 38–126)
Anion gap: 11 (ref 5–15)
BUN: 77 mg/dL — ABNORMAL HIGH (ref 8–23)
CO2: 28 mmol/L (ref 22–32)
Calcium: 8 mg/dL — ABNORMAL LOW (ref 8.9–10.3)
Chloride: 105 mmol/L (ref 98–111)
Creatinine, Ser: 2.54 mg/dL — ABNORMAL HIGH (ref 0.61–1.24)
GFR calc Af Amer: 27 mL/min — ABNORMAL LOW (ref >60)
GFR calc non Af Amer: 24 mL/min — ABNORMAL LOW (ref >60)
Glucose, Bld: 161 mg/dL — ABNORMAL HIGH (ref 70–99)
Potassium: 4.4 mmol/L (ref 3.5–5.1)
Sodium: 144 mmol/L (ref 135–145)
Total Bilirubin: 0.6 mg/dL (ref 0.3–1.2)
Total Protein: 5.6 g/dL — ABNORMAL LOW (ref 6.5–8.1)

## 2019-10-03 LAB — LACTIC ACID, PLASMA
Lactic Acid, Venous: 1.1 mmol/L (ref 0.5–1.9)
Lactic Acid, Venous: 1 mmol/L (ref 0.5–1.9)

## 2019-10-03 LAB — PROTIME-INR
INR: 1.5 — ABNORMAL HIGH (ref 0.8–1.2)
Prothrombin Time: 17.9 seconds — ABNORMAL HIGH (ref 11.4–15.2)

## 2019-10-03 LAB — APTT: aPTT: 36 seconds (ref 24–36)

## 2019-10-03 LAB — C-REACTIVE PROTEIN: CRP: 11.7 mg/dL — ABNORMAL HIGH (ref <1.0)

## 2019-10-03 LAB — PROCALCITONIN: Procalcitonin: 1.07 ng/mL

## 2019-10-03 LAB — TROPONIN I (HIGH SENSITIVITY)
Troponin I (High Sensitivity): 352 ng/L (ref <18)
Troponin I (High Sensitivity): 352 ng/L (ref <18)

## 2019-10-03 LAB — POC SARS CORONAVIRUS 2 AG -  ED: SARS Coronavirus 2 Ag: POSITIVE — AB

## 2019-10-03 LAB — CBG MONITORING, ED: Glucose-Capillary: 147 mg/dL — ABNORMAL HIGH (ref 70–99)

## 2019-10-03 LAB — BRAIN NATRIURETIC PEPTIDE: B Natriuretic Peptide: 851.4 pg/mL — ABNORMAL HIGH (ref 0.0–100.0)

## 2019-10-03 LAB — ABO/RH: ABO/RH(D): A POS

## 2019-10-03 LAB — D-DIMER, QUANTITATIVE: D-Dimer, Quant: 2.35 ug/mL-FEU — ABNORMAL HIGH (ref 0.00–0.50)

## 2019-10-03 LAB — HEPARIN LEVEL (UNFRACTIONATED): Heparin Unfractionated: 0.86 IU/mL — ABNORMAL HIGH (ref 0.30–0.70)

## 2019-10-03 MED ORDER — ALBUTEROL SULFATE HFA 108 (90 BASE) MCG/ACT IN AERS
2.0000 | INHALATION_SPRAY | Freq: Four times a day (QID) | RESPIRATORY_TRACT | Status: DC
  Administered 2019-10-03 – 2019-10-07 (×17): 2 via RESPIRATORY_TRACT
  Filled 2019-10-03: qty 6.7

## 2019-10-03 MED ORDER — ACETAMINOPHEN 325 MG PO TABS
650.0000 mg | ORAL_TABLET | Freq: Four times a day (QID) | ORAL | Status: DC | PRN
  Filled 2019-10-03: qty 2

## 2019-10-03 MED ORDER — DONEPEZIL HCL 5 MG PO TABS
10.0000 mg | ORAL_TABLET | Freq: Every day | ORAL | Status: DC
  Administered 2019-10-04 – 2019-10-06 (×3): 10 mg via ORAL
  Filled 2019-10-03 (×4): qty 2

## 2019-10-03 MED ORDER — PRO-STAT SUGAR FREE PO LIQD
30.0000 mL | Freq: Three times a day (TID) | ORAL | Status: DC
  Administered 2019-10-03 – 2019-10-07 (×7): 30 mL via ORAL
  Filled 2019-10-03 (×6): qty 30

## 2019-10-03 MED ORDER — GUAIFENESIN-DM 100-10 MG/5ML PO SYRP
10.0000 mL | ORAL_SOLUTION | ORAL | Status: DC | PRN
  Administered 2019-10-05: 10 mL via ORAL
  Filled 2019-10-03: qty 10

## 2019-10-03 MED ORDER — INSULIN ASPART 100 UNIT/ML ~~LOC~~ SOLN
0.0000 [IU] | Freq: Three times a day (TID) | SUBCUTANEOUS | Status: DC
  Administered 2019-10-03: 3 [IU] via SUBCUTANEOUS
  Administered 2019-10-03: 11 [IU] via SUBCUTANEOUS
  Administered 2019-10-03: 4 [IU] via SUBCUTANEOUS
  Administered 2019-10-04: 7 [IU] via SUBCUTANEOUS
  Administered 2019-10-04: 20 [IU] via SUBCUTANEOUS
  Administered 2019-10-04: 7 [IU] via SUBCUTANEOUS
  Administered 2019-10-05: 4 [IU] via SUBCUTANEOUS
  Administered 2019-10-05: 7 [IU] via SUBCUTANEOUS
  Filled 2019-10-03: qty 0.2

## 2019-10-03 MED ORDER — VANCOMYCIN HCL 2000 MG/400ML IV SOLN
2000.0000 mg | Freq: Once | INTRAVENOUS | Status: AC
  Administered 2019-10-03: 2000 mg via INTRAVENOUS
  Filled 2019-10-03: qty 400

## 2019-10-03 MED ORDER — HEPARIN (PORCINE) 25000 UT/250ML-% IV SOLN: 1100.0000 [IU]/h | INTRAVENOUS | Status: DC

## 2019-10-03 MED ORDER — ATORVASTATIN CALCIUM 20 MG PO TABS
20.0000 mg | ORAL_TABLET | Freq: Every evening | ORAL | Status: DC
  Administered 2019-10-03 – 2019-10-06 (×4): 20 mg via ORAL
  Filled 2019-10-03 (×2): qty 1
  Filled 2019-10-03 (×4): qty 2
  Filled 2019-10-03 (×2): qty 1

## 2019-10-03 MED ORDER — HYDROCODONE-ACETAMINOPHEN 5-325 MG PO TABS
1.0000 | ORAL_TABLET | ORAL | Status: DC | PRN
  Administered 2019-10-05 – 2019-10-07 (×3): 1 via ORAL
  Filled 2019-10-03 (×3): qty 1

## 2019-10-03 MED ORDER — HYDROCOD POLST-CPM POLST ER 10-8 MG/5ML PO SUER
5.0000 mL | Freq: Two times a day (BID) | ORAL | Status: DC | PRN
  Administered 2019-10-06: 5 mL via ORAL
  Filled 2019-10-03: qty 5

## 2019-10-03 MED ORDER — ACETAMINOPHEN 500 MG PO TABS
1000.0000 mg | ORAL_TABLET | Freq: Once | ORAL | Status: AC
  Administered 2019-10-03: 1000 mg via ORAL
  Filled 2019-10-03: qty 2

## 2019-10-03 MED ORDER — NYSTATIN 100000 UNIT/GM EX CREA
TOPICAL_CREAM | Freq: Two times a day (BID) | CUTANEOUS | Status: DC
  Filled 2019-10-03 (×2): qty 15

## 2019-10-03 MED ORDER — RIVAROXABAN 15 MG PO TABS
15.0000 mg | ORAL_TABLET | Freq: Every day | ORAL | Status: DC
  Administered 2019-10-03 – 2019-10-05 (×3): 15 mg via ORAL
  Filled 2019-10-03 (×3): qty 1

## 2019-10-03 MED ORDER — DEXAMETHASONE SODIUM PHOSPHATE 10 MG/ML IJ SOLN
6.0000 mg | Freq: Every day | INTRAMUSCULAR | Status: DC
  Filled 2019-10-03: qty 1

## 2019-10-03 MED ORDER — ONDANSETRON HCL 4 MG/2ML IJ SOLN: 4.0000 mg | Freq: Four times a day (QID) | INTRAMUSCULAR | Status: DC | PRN

## 2019-10-03 MED ORDER — FINASTERIDE 5 MG PO TABS
5.0000 mg | ORAL_TABLET | Freq: Every evening | ORAL | Status: DC
  Administered 2019-10-03 – 2019-10-06 (×4): 5 mg via ORAL
  Filled 2019-10-03 (×4): qty 1

## 2019-10-03 MED ORDER — SERTRALINE HCL 50 MG PO TABS
25.0000 mg | ORAL_TABLET | Freq: Every day | ORAL | Status: DC
  Administered 2019-10-03 – 2019-10-07 (×5): 25 mg via ORAL
  Filled 2019-10-03 (×5): qty 1

## 2019-10-03 MED ORDER — PANTOPRAZOLE SODIUM 40 MG PO TBEC
40.0000 mg | DELAYED_RELEASE_TABLET | Freq: Every day | ORAL | Status: DC
  Administered 2019-10-03 – 2019-10-07 (×5): 40 mg via ORAL
  Filled 2019-10-03 (×5): qty 1

## 2019-10-03 MED ORDER — DEXAMETHASONE SODIUM PHOSPHATE 10 MG/ML IJ SOLN
10.0000 mg | Freq: Once | INTRAMUSCULAR | Status: AC
  Administered 2019-10-03: 10 mg via INTRAVENOUS
  Filled 2019-10-03: qty 1

## 2019-10-03 MED ORDER — FUROSEMIDE 10 MG/ML IJ SOLN
80.0000 mg | Freq: Once | INTRAMUSCULAR | Status: AC
  Administered 2019-10-03: 80 mg via INTRAVENOUS
  Filled 2019-10-03: qty 8

## 2019-10-03 MED ORDER — ADULT MULTIVITAMIN W/MINERALS CH
1.0000 | ORAL_TABLET | Freq: Every day | ORAL | Status: DC
  Administered 2019-10-03 – 2019-10-07 (×5): 1 via ORAL
  Filled 2019-10-03 (×5): qty 1

## 2019-10-03 MED ORDER — INSULIN ASPART 100 UNIT/ML ~~LOC~~ SOLN
0.0000 [IU] | Freq: Every day | SUBCUTANEOUS | Status: DC
  Administered 2019-10-03: 3 [IU] via SUBCUTANEOUS
  Administered 2019-10-04: 5 [IU] via SUBCUTANEOUS
  Filled 2019-10-03: qty 0.05

## 2019-10-03 MED ORDER — SODIUM CHLORIDE 0.9 % IV SOLN
2.0000 g | Freq: Once | INTRAVENOUS | Status: AC
  Administered 2019-10-03: 2 g via INTRAVENOUS
  Filled 2019-10-03: qty 2

## 2019-10-03 MED ORDER — TAMSULOSIN HCL 0.4 MG PO CAPS
0.4000 mg | ORAL_CAPSULE | Freq: Every day | ORAL | Status: DC
  Administered 2019-10-03 – 2019-10-07 (×5): 0.4 mg via ORAL
  Filled 2019-10-03 (×7): qty 1

## 2019-10-03 MED ORDER — SODIUM CHLORIDE 0.9 % IV SOLN
100.0000 mg | Freq: Every day | INTRAVENOUS | Status: DC
  Administered 2019-10-04 – 2019-10-06 (×3): 100 mg via INTRAVENOUS
  Filled 2019-10-03 (×2): qty 20

## 2019-10-03 MED ORDER — ONDANSETRON HCL 4 MG PO TABS: 4.0000 mg | ORAL_TABLET | Freq: Four times a day (QID) | ORAL | Status: DC | PRN

## 2019-10-03 MED ORDER — SODIUM CHLORIDE 0.9 % IV SOLN
200.0000 mg | Freq: Once | INTRAVENOUS | Status: AC
  Administered 2019-10-03: 200 mg via INTRAVENOUS
  Filled 2019-10-03: qty 200

## 2019-10-03 MED ORDER — INSULIN GLARGINE 100 UNIT/ML ~~LOC~~ SOLN
10.0000 [IU] | Freq: Two times a day (BID) | SUBCUTANEOUS | Status: DC
  Administered 2019-10-03 – 2019-10-04 (×3): 10 [IU] via SUBCUTANEOUS
  Filled 2019-10-03 (×5): qty 0.1

## 2019-10-03 MED ORDER — DORZOLAMIDE HCL 2 % OP SOLN
1.0000 [drp] | Freq: Two times a day (BID) | OPHTHALMIC | Status: DC
  Administered 2019-10-03 – 2019-10-07 (×9): 1 [drp] via OPHTHALMIC
  Filled 2019-10-03: qty 10

## 2019-10-03 NOTE — ED Provider Notes (Signed)
Attestation: Medical screening examination/treatment/procedure(s) were conducted as a shared visit with non-physician practitioner(s) and myself.  I personally evaluated the patient during the encounter.  Briefly, the patient is a 79 y.o. male with h/o dementia, chronic dCHF (06/2019 Echo 50-55% EF), CKD, IDDM, atrial flutter on Xarelto, HTN, HLD, CVA, glaucoma presents by EMS who reports they were called for low O2 sats. Sats of 60%s on his 2LNC. Reported to be COVID+.  Vitals:   09/23/2019 0400 09/13/2019 0430  BP: 113/66 112/75  Pulse: 94 91  Resp: (!) 27 (!) 27  Temp:    SpO2: 92% 95%    CONSTITUTIONAL:  ill-appearing, NAD NEURO:  Alert, pleasantly demented. EYES:  pupils equal and reactive ENT/NECK:  trachea midline, no JVD CARDIO:  reg rate, irreg irreg rhythm; 3+ pitting edema PULM:  Diminished basilar lung sounds GI/GU:  Abdomen non-distended MSK/SPINE:  Left BKA     EKG Interpretation  Date/Time:  Monday October 03 2019 00:18:18 EST Ventricular Rate:  103 PR Interval:    QRS Duration: 138 QT Interval:  360 QTC Calculation: 472 R Axis:   -101 Text Interpretation: Atrial fibrillation Ventricular premature complex Right bundle branch block Inferior infarct, old Anterolateral infarct, age indeterminate Otherwise no significant change Confirmed by Addison Lank [phone]) on 09/22/2019 12:39:28 AM       Increased O2 demand due to superimposed COVID PNA with CHF exacerbation involving recurrent pleural effusions.  On [place] IV lasix Empiric IV Abx IV decadron Admission   .Critical Care Performed by: Fatima Blank, MD Authorized by: Fatima Blank, MD    CRITICAL CARE Performed by: Grayce Sessions Rosielee Corporan Total critical care time: 10 minutes Critical care time was exclusive of separately billable procedures and treating other patients. Critical care was necessary to treat or prevent imminent or life-threatening deterioration. Critical care was time  spent personally by me on the following activities: development of treatment plan with patient and/or surrogate as well as nursing, discussions with consultants, evaluation of patient's response to treatment, examination of patient, obtaining history from patient or surrogate, ordering and performing treatments and interventions, ordering and review of laboratory studies, ordering and review of radiographic studies, pulse oximetry and re-evaluation of patient's condition.           Fatima Blank, MD 09/14/2019 [phone]

## 2019-10-03 NOTE — ED Triage Notes (Signed)
Pt presents to the ED via [facility] from [facility]. Staff called out due to pt being at 60s% on his normal 4 LPM [place]. Pt also had a recent left leg amputation and just finished ABX for that.

## 2019-10-03 NOTE — Progress Notes (Signed)
Patient seen and examined this morning, admitted earlier by Dr. Alcario Drought.  Please review H&P, agree with the assessment and plan, 79 year old male with DM 2, vascular dementia, chronic diastolic CHF, CKD 4, paroxysmal A. 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 and progression of his chronic kidney disease, discharged to SNF comes into the hospital with hypoxia found to have COVID-19   Acute hypoxic respiratory failure due to COVID-19 -Currently on 15 L high flow nasal cannula barely maintaining sats in the upper 80s, continue remdesivir, steroids, and supportive treatment  Chronic kidney disease stage IV -Daily labs  Chronic diastolic CHF -Got 80 mg of Lasix in the ED, hold further Lasix  Essential hypertension -Monitor  Chronic pleural effusion, loculated -Monitor  Vascular dementia  DM 2  CBG (last 3)  Recent Labs    09/15/2019 0828  GLUCAP 147*   Goals of care -Discussed with the daughter [name] [phone] over the phone, confirmed DNR, agreeable with medical management would avoid escalation of care, ICU, ventilator, if he is to decompensate and get worse we will focus on comfort care  Scheduled Meds: . albuterol  2 puff Inhalation Q6H  . atorvastatin  20 mg Oral QPM  . [START ON 10/04/2019] dexamethasone (DECADRON) injection  6 mg Intravenous Daily  . donepezil  10 mg Oral QHS  . dorzolamide  1 drop Both Eyes BID  . feeding supplement (PRO-STAT SUGAR FREE 64)  30 mL Oral TID WC  . finasteride  5 mg Oral QPM  . insulin aspart  0-20 Units Subcutaneous TID WC  . insulin aspart  0-5 Units Subcutaneous QHS  . insulin glargine  10 Units Subcutaneous BID  . multivitamin with minerals  1 tablet Oral Daily  . nystatin cream   Topical BID  . pantoprazole  40 mg Oral Daily  . rivaroxaban  15 mg Oral Q supper  . sertraline  25 mg Oral Daily   . tamsulosin  0.4 mg Oral Q breakfast   Continuous Infusions: . [START ON 10/04/2019] remdesivir 100 mg in NS 100 mL     PRN Meds:.acetaminophen, chlorpheniramine-HYDROcodone, guaiFENesin-dextromethorphan, HYDROcodone-acetaminophen, ondansetron **OR** ondansetron (ZOFRAN) IV  Gregery Walberg M. Cruzita Lederer, MD, PhD [facility]  Between 7 am - 7 pm I am available, please contact me via Amion or Securechat Between 7 pm - 7 am I am not available, please contact night coverage MD/APP via Amion

## 2019-10-03 NOTE — ED Notes (Signed)
Date and time results received: 10/06/2019 0456 (use smartphrase ".now" to insert current time)  Test: Troponin Critical Value: 352   Name of Provider Notified: Cardoma Md Orders Received? Or Actions Taken?: waiting on orders

## 2019-10-03 NOTE — Progress Notes (Signed)
ANTICOAGULATION CONSULT NOTE - Follow Up Consult  Pharmacy Consult:  Heparin >> Xarelto Indication: atrial fibrillation  Allergies  Allergen Reactions  . Ciprofloxacin Diarrhea  . Neurontin [Gabapentin]     Hallucinations/altered mental status  . Oxycodone Other (See Comments)    Altered mental status    Vital Signs: Temp: 97.6 F (36.4 C) (01/25 0900) Temp Source: Axillary (01/25 0900) BP: 99/67 (01/25 0900) Pulse Rate: Darrell (01/25 0900)  Labs: Recent Labs    09/11/2019 0345 09/19/2019 0436 09/30/2019 0629  HGB 11.1*  --   --   HCT 40.3  --   --   PLT 177  --   --   APTT  --   --  36  LABPROT  --   --  17.9*  INR  --   --  1.5*  HEPARINUNFRC  --   --  0.86*  CREATININE 2.54*  --   --   TROPONINIHS 352* 352*  --     Estimated Creatinine Clearance: 26.8 mL/min (A) (by C-G formula based on SCr of 2.54 mg/dL (H)).   Assessment: Darrell Mcclain with history of Afib on Xarelto PTA.  Pharmacy was asked to transition to IV heparin and now to resume Xarelto.  Heparin has not been started since last Xarelto dose was on 10/02/19 evening.  Patient has reduced renal clearance but it is > 15 ml/min, which is the threshold to hold Xarelto.  CBC is stable; no bleeding reported.  Goal of Therapy:  Appropriate anticoagulation Monitor platelets by anticoagulation protocol: Yes   Plan:  Xarelto 15mg  PO daily with supper Pharmacy will sign off and follow peripherally.  Will watch renal clearance closely.  Muhammadali Ries D. Mina Marble, PharmD, BCPS, [place] 09/25/2019, 10:27 AM

## 2019-10-03 NOTE — ED Provider Notes (Signed)
[facility] DEPT Provider Note   CSN: [id number] Arrival date & time: 10/02/19  2345     History Chief Complaint  Patient presents with  . COVID+    Darrell Mcclain is a 79 y.o. male.  Patient from [facility] with history of dementia, chronic dCHF (06/2019 Echo 50-55% EF), CKD, IDDM, atrial flutter on Xarelto, HTN, HLD, CVA, glaucoma presents by EMS who reports they were called for low O2 sats. Per their report he was in the 60's on his 2L, taken to 4L and improved to 80's and is 96% on 5L. They note he desats again with any movement or transfer and recovers when at rest again. He has reportedly tested positive for COVID, however, the time test was taken is unknown. Attempt to contact [facility] for additional information unsuccessful - no answer. The patient states he has pain all over. He does not feel SOB, he denies cough. Patient considered unreliable historian secondary to dementia.   The history is provided by the patient and the EMS personnel. No language interpreter was used.       Past Medical History:  Diagnosis Date  . Anticoagulant long-term use    xarelto  . Arthritis    right hand  . At risk for sleep apnea    STOP-BANG==  6      PT ALREADY SET UP TO HAVE STUDY AT [facility] , PCP AWARE  . Atrial flutter, chronic [facility]) dx 01/ 2015   cardiologist-  dr Angelena Form  . Bilateral lower extremity edema   . BPH (benign prostatic hyperplasia)   . Cerebral microvascular disease    advanced and multiple foci throughout hemispheres and brain stem-- residual memory issues  . Chronic constipation   . Chronic kidney disease (CKD), stage III (moderate)    nephrologist-  dr Marval Regal [facility])  . Diastolic CHF, chronic ([place])   . Exertional dyspnea   . Foley catheter in place   . GERD (gastroesophageal reflux disease)   . Glaucoma, both eyes   . History of adenomatous polyp of colon    2012  . History of CVA  (cerebrovascular accident)    (pt had no symptoms other than memory issues) per MRI 11-29-2015  very small infarct right frontal lobe and multiple foci throughout hemispheres and brain stem  . 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 Hyperlipidemia   . Hypertension   . Iron deficiency anemia   . Memory loss   . Prostate cancer ([place])   . RBBB (right bundle branch block with left anterior fascicular block)   . Rosacea   . Type 2 diabetes mellitus ([place])   . Urine retention   . Vitamin B 12 deficiency     Patient Active Problem List   Diagnosis Date Noted  . Acute renal failure ([place])   . Encephalopathy acute   . Hypercarbia   . Hyperkalemia   . Acute lower UTI   . Pleural effusion   . Chronic respiratory failure with hypoxia ([place])   . Vascular dementia without behavioral disturbance ([place])   . Hypokalemia   . Goals of care, counseling/discussion   . Palliative care encounter   . Osteomyelitis ([place]) 09/10/2019  . Stage 3a chronic kidney disease 09/10/2019  . Diabetic foot infection ([place])   . Moderate protein-calorie malnutrition ([place])   . Malignant neoplasm of prostate ([place]) 09/10/2016  . Falls 11/21/2015  . Memory loss 11/21/2015  . Uncontrolled diabetes  mellitus ([place]) 11/21/2015  . Morbid obesity ([place]) 11/21/2015  . Hyperlipidemia 11/21/2015  . Essential hypertension 11/21/2015  . 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 Chronic diastolic CHF (congestive heart failure), NYHA class 2 ([place]) 02/28/2014  . Atrial flutter ([place]) 10/13/2013    Past Surgical History:  Procedure Laterality Date  . AMPUTATION Left 09/14/2019   Procedure: LEFT BELOW KNEE AMPUTATION;  Surgeon: Newt Minion, MD;  Location: [facility];  Service: Orthopedics;  Laterality: Left;  . CATARACT EXTRACTION W/ INTRAOCULAR LENS  IMPLANT, BILATERAL  2016  . IR THORACENTESIS ASP PLEURAL SPACE W/IMG GUIDE  12/01/2018  . ORIF PROXIMAL HUMEROUS FX AND BICEPS TENODESIS  11/09/2015  . OTHER SURGICAL HISTORY   1996   reconstruction left toe (amputation repair)  . PROSTATE BIOPSY N/A 07/22/2016   Procedure: BIOPSY TRANSRECTAL ULTRASONIC PROSTATE (TUBP);  Surgeon: Festus Aloe, MD;  Location: [facility];  Service: Urology;  Laterality: N/A;  . TONSILLECTOMY  1951  . TRANSTHORACIC ECHOCARDIOGRAM  08/23/2015   mild LVH, ef 55-60%, grade 1 diastolitc dysfunction  . WISDOM TOOTH EXTRACTION  1985       Family History  Problem Relation Age of Onset  . Cancer Sister        lung to brain/smoker  . Colon cancer Neg Hx   . Rectal cancer Neg Hx   . Stomach cancer Neg Hx   . CAD Neg Hx   . Dementia Neg Hx     Social History   Tobacco Use  . Smoking status: Former Smoker    Packs/day: 2.00    Years: 20.00    Pack years: 40.00    Quit date: 05/09/1983    Years since quitting: 36.4  . Smokeless tobacco: Never Used  Substance Use Topics  . Alcohol use: No  . Drug use: No    Home Medications Prior to Admission medications   Medication Sig Start Date End Date Taking? Authorizing Provider  acetaminophen (TYLENOL) 500 MG tablet Take 500 mg by mouth every 6 (six) hours as needed for mild pain, moderate pain, fever or headache.     [provider]  Amino Acids-Protein Hydrolys (FEEDING SUPPLEMENT, PRO-STAT SUGAR FREE 64,) LIQD Take 30 mLs by mouth 3 (three) times daily with meals.    [provider]  atorvastatin (LIPITOR) 20 MG tablet Take 1 tablet (20 mg total) by mouth daily. Patient taking differently: Take 20 mg by mouth every evening.  09/22/13   Burnell Blanks, MD  donepezil (ARICEPT) 10 MG tablet Take 1 tablet (10 mg total) by mouth at bedtime. 11/09/17   Melvenia Beam, MD  dorzolamide (TRUSOPT) 2 % ophthalmic solution Place 1 drop into both eyes 2 (two) times daily.  06/20/15   [provider]  finasteride (PROSCAR) 5 MG tablet Take 5 mg by mouth every evening.  05/16/16   [provider]  furosemide (LASIX) 20 MG tablet Take 1  tablet (20 mg total) by mouth 2 (two) times daily. 09/22/19 12/21/19  Dessa Phi, DO  HYDROcodone-acetaminophen (NORCO/VICODIN) 5-325 MG tablet Take 1 tablet by mouth every 4 (four) hours as needed for moderate pain. 09/22/19   Dessa Phi, DO  insulin aspart (NOVOLOG) 100 UNIT/ML injection Inject 2-9 Units into the skin See admin instructions. Per Sliding Scale three times daily and at bedtime. 101-150 2 units 151-200 3 units 201-250 5 units 251-300 7 units 301-350 9 units > 350 11 units    [provider]  insulin glargine (  LANTUS) 100 UNIT/ML injection Inject 10 Units into the skin 2 (two) times daily.     [provider]  Melatonin 3 MG TABS Take 3 mg by mouth at bedtime.    [provider]  Multiple Vitamin (MULTIVITAMIN WITH MINERALS) TABS tablet Take 1 tablet by mouth daily.    [provider]  neomycin-bacitracin-polymyxin (NEOSPORIN) [phone] ointment Apply 1 application topically daily. Left lateral arm skin tear    [provider]  omeprazole (PRILOSEC) 20 MG capsule Take 20 mg by mouth every morning.  04/26/11   [provider]  potassium chloride SA (KLOR-CON) 20 MEQ tablet Take 20 mEq by mouth daily.    [provider]  rivaroxaban (XARELTO) 20 MG TABS tablet Take 1 tablet (20 mg total) by mouth daily with supper. Patient taking differently: Take 20 mg by mouth daily.  07/23/16   Festus Aloe, MD  sertraline (ZOLOFT) 25 MG tablet Take 25 mg by mouth daily.    [provider]  tamsulosin (FLOMAX) 0.4 MG CAPS capsule Take 0.4 mg by mouth daily with breakfast.  01/09/14   [provider]  Vitamin D, Ergocalciferol, (DRISDOL) 1.25 MG (50000 UT) CAPS capsule Take 50,000 Units by mouth every Wednesday.    [provider]    Allergies    Ciprofloxacin, Neurontin [gabapentin], and Oxycodone  Review of Systems   Review of Systems  Unable to perform ROS: Dementia    Physical Exam  Updated Vital Signs BP 122/78   Pulse (!) 39   Temp (!) 102 F (38.9 C) (Rectal)   Resp (!) 24   SpO2 91%   Physical Exam Vitals and nursing note reviewed.  Constitutional:      General: He is not in acute distress.    Appearance: He is well-developed. He is obese. He is ill-appearing.  HENT:     Head: Normocephalic.  Cardiovascular:     Rate and Rhythm: Normal rate and regular rhythm.  Pulmonary:     Effort: Pulmonary effort is normal.     Breath sounds: Normal breath sounds.     Comments: Diminished breath sounds. Exam limited by body habitus. Abdominal:     General: Bowel sounds are normal.     Palpations: Abdomen is soft.     Tenderness: There is no abdominal tenderness. There is no guarding or rebound.  Musculoskeletal:        General: Normal range of motion.     Cervical back: Normal range of motion and neck supple.     Right lower leg: Edema present.     Left lower leg: Edema present.  Skin:    General: Skin is warm and dry.     Findings: Rash present.     Comments: Left BKA. There is 3+ pitting edema in right LE, thigh and left thigh.  There is confluent red rash in groin c/w candidasis  Neurological:     Mental Status: He is alert.     ED Results / Procedures / Treatments   Labs (all labs ordered are listed, but only abnormal results are displayed) Labs Reviewed  CBC WITH DIFFERENTIAL/PLATELET  LACTIC ACID, PLASMA  LACTIC ACID, PLASMA  BRAIN NATRIURETIC PEPTIDE  COMPREHENSIVE METABOLIC PANEL  POC SARS CORONAVIRUS 2 AG -  ED  TROPONIN I (HIGH SENSITIVITY)    EKG EKG Interpretation  Date/Time:  Monday October 03 2019 00:18:18 EST Ventricular Rate:  103 PR Interval:    QRS Duration: 138 QT Interval:  360 QTC Calculation:  472 R Axis:   -101 Text Interpretation: Atrial fibrillation Ventricular premature complex Right bundle branch block Inferior infarct, old Anterolateral infarct, age indeterminate Otherwise no significant change Confirmed by  Addison Lank [phone]) on 09/16/2019 12:39:28 AM   Radiology No results found.  Procedures Procedures (including critical care time) CRITICAL CARE Performed by: Dewaine Oats   Total critical care time: 55 minutes  Critical care time was exclusive of separately billable procedures and treating other patients.  Critical care was necessary to treat or prevent imminent or life-threatening deterioration.  Critical care was time spent personally by me on the following activities: development of treatment plan with patient and/or surrogate as well as nursing, discussions with consultants, evaluation of patient's response to treatment, examination of patient, obtaining history from patient or surrogate, ordering and performing treatments and interventions, ordering and review of laboratory studies, ordering and review of radiographic studies, pulse oximetry and re-evaluation of patient's condition.  Medications Ordered in ED Medications  furosemide (LASIX) injection 80 mg (has no administration in time range)    ED Course  I have reviewed the triage vital signs and the nursing notes.  Pertinent labs & imaging results that were available during my care of the patient were reviewed by me and considered in my medical decision making (see chart for details).    MDM Rules/Calculators/A&P                      Patient to ED from [facility], known to be COVID+ by report from the [facility] (no documentation), found severely hypoxic tonight, 60's per EMS.   Patient does not appear in any distress. He is febrile on arrival, addressed with Tylenol. O2 saturation 95% on 5L via [place]. Shortly after arrival, saturations dropped requiring NRB at 8L to maintain above 90 sats. There is no labored breathing. Cefepime and Vanc started given potential pneumonia.  He is fluid overloaded with presence of anasarca. Currently on Lasix 40 mg. Lasix 80 mg given in ED. CXR with mixed interstitial airspace disease and  recurrent pleural effusions. Pleural effusion drained by thoracentesis last admission earlier this month, found to be nonmalignant but likely to recur.   Cultures pending. Attempt to wean patient off NRB to Omega 8L resulted in desaturation to 82% with difficult recovery after NRB replaced.   Decadron ordered. Attempted to contact daughter unsuccessful as phone went to voice mail and mail box full.   Discussed admission with Dr. Hurley Cisco, [facility], who accepts the patient onto his service.   Final Clinical Impression(s) / ED Diagnoses Final diagnoses:  None   1. Respiratory failure 2. COVID infection  Rx / DC Orders ED Discharge Orders    None       Charlann Lange, PA-C 09/13/2019 [place], MD 09/10/2019 [phone]

## 2019-10-03 NOTE — Plan of Care (Signed)

## 2019-10-03 NOTE — ED Triage Notes (Signed)
Pt placed on NRB at 12 LPM to maintain O2 above 90%.

## 2019-10-03 NOTE — H&P (Addendum)
History and Physical    Darrell Mcclain XBL:[id number] DOB: 1941-03-07 DOA: 09/17/2019  PCP: Burnard Bunting, MD  Patient coming from: SNF  I have personally briefly reviewed patient's old medical records in [place]  Chief Complaint: Hypoxia  HPI: Darrell Mcclain is a 79 y.o. male with medical history significant of DM2, vascular dementia, chronic dCHF, CKD stage 4 (progressed after admit earlier this month), PAF on Xarelto.  At baseline patient requires 2L O2 and has chronic loculated pleural effusions.  Patient admitted from 1/1-1/15 with osteomyelitis of foot, underwent BKA.  That admit complicated by progression of CKD from stage 3 to 4.  Discharged to SNF.  Tested positive for COVID on Tues.  Had been asymptomatic until recently though.  This evening, satting 60% on 2L.  Sent in to ED.   ED Course: Tm 102, WBC nl, BUN 77 creat 2.5 (2.6 on 1/14).  Currently satting 88% on 15L NRB.  EDP gave 80mg  lasix, 10mg  decadron, cefepime and vanc.  Trop 352 (x2), BNP 851.  CXR shows diffuse infiltrates as well as the chronic B effusions.  COVID POC is positive.  Patient is pleasantly confused and/or having the well described COVID "happy hypoxia".  And is slightly sleepy but denying all complaints.  Denies pain (anywhere), SOB, fever, chills.  Review of Systems: As per HPI, otherwise all review of systems negative.  Past Medical History:  Diagnosis Date  . Anticoagulant long-term use    xarelto  . Arthritis    right hand  . At risk for sleep apnea    STOP-BANG==  6      PT ALREADY SET UP TO HAVE STUDY AT [facility] , PCP AWARE  . Atrial flutter, chronic [facility]) dx 01/ 2015   cardiologist-  dr Angelena Form  . Bilateral lower extremity edema   . BPH (benign prostatic hyperplasia)   . Cerebral microvascular disease    advanced and multiple foci throughout hemispheres and brain stem-- residual memory issues  . Chronic constipation   . Chronic kidney disease (CKD),  stage III (moderate)    nephrologist-  dr Marval [facility])  . Diastolic CHF, chronic ([place])   . Exertional dyspnea   . Foley catheter in place   . GERD (gastroesophageal reflux disease)   . Glaucoma, both eyes   . History of adenomatous polyp of colon    2012  . History of CVA (cerebrovascular accident)    (pt had no symptoms other than memory issues) per MRI 11-29-2015  very small infarct right frontal lobe and multiple foci throughout hemispheres and brain stem  . 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 Hyperlipidemia   . Hypertension   . Iron deficiency anemia   . Memory loss   . Prostate cancer ([place])   . RBBB (right bundle branch block with left anterior fascicular block)   . Rosacea   . Type 2 diabetes mellitus ([place])   . Urine retention   . Vitamin B 12 deficiency     Past Surgical History:  Procedure Laterality Date  . AMPUTATION Left 09/14/2019   Procedure: LEFT BELOW KNEE AMPUTATION;  Surgeon: Newt Minion, MD;  Location: [place];  Service: Orthopedics;  Laterality: Left;  . CATARACT EXTRACTION W/ INTRAOCULAR LENS  IMPLANT, BILATERAL  2016  . IR THORACENTESIS ASP PLEURAL SPACE W/IMG GUIDE  12/01/2018  . ORIF PROXIMAL HUMEROUS FX AND BICEPS TENODESIS  11/09/2015  . [place]  reconstruction left toe (amputation repair)  . PROSTATE BIOPSY N/A 07/22/2016   Procedure: BIOPSY TRANSRECTAL ULTRASONIC PROSTATE (TUBP);  Surgeon: Festus Aloe, MD;  Location: [facility];  Service: Urology;  Laterality: N/A;  . TONSILLECTOMY  1951  . TRANSTHORACIC ECHOCARDIOGRAM  08/23/2015   mild LVH, ef 55-60%, grade 1 diastolitc dysfunction  . [place]     reports that he quit smoking about 36 years ago. He has a 40.00 pack-year smoking history. He has never used smokeless tobacco. He reports that he does not drink alcohol or use drugs.  Allergies  Allergen Reactions  . Ciprofloxacin Diarrhea  . Neurontin  [Gabapentin]     Hallucinations/altered mental status  . Oxycodone Other (See Comments)    Altered mental status    Family History  Problem Relation Age of Onset  . Cancer Sister        lung to brain/smoker  . Colon cancer Neg Hx   . Rectal cancer Neg Hx   . Stomach cancer Neg Hx   . CAD Neg Hx   . Dementia Neg Hx      Prior to Admission medications   Medication Sig Start Date End Date Taking? Authorizing Provider  acetaminophen (TYLENOL) 500 MG tablet Take 500 mg by mouth every 6 (six) hours as needed for mild pain, moderate pain, fever or headache.     [provider]  Amino Acids-Protein Hydrolys (FEEDING SUPPLEMENT, PRO-STAT SUGAR FREE 64,) LIQD Take 30 mLs by mouth 3 (three) times daily with meals.    [provider]  atorvastatin (LIPITOR) 20 MG tablet Take 1 tablet (20 mg total) by mouth daily. Patient taking differently: Take 20 mg by mouth every evening.  09/22/13   Burnell Blanks, MD  donepezil (ARICEPT) 10 MG tablet Take 1 tablet (10 mg total) by mouth at bedtime. 11/09/17   Melvenia Beam, MD  dorzolamide (TRUSOPT) 2 % ophthalmic solution Place 1 drop into both eyes 2 (two) times daily.  06/20/15   [provider]  finasteride (PROSCAR) 5 MG tablet Take 5 mg by mouth every evening.  05/16/16   [provider]  furosemide (LASIX) 20 MG tablet Take 1 tablet (20 mg total) by mouth 2 (two) times daily. 09/22/19 12/21/19  Dessa Phi, DO  HYDROcodone-acetaminophen (NORCO/VICODIN) 5-325 MG tablet Take 1 tablet by mouth every 4 (four) hours as needed for moderate pain. 09/22/19   Dessa Phi, DO  insulin aspart (NOVOLOG) 100 UNIT/ML injection Inject 2-9 Units into the skin See admin instructions. Per Sliding Scale three times daily and at bedtime. 101-150 2 units 151-200 3 units 201-250 5 units 251-300 7 units 301-350 9 units > 350 11 units    [provider]  insulin glargine (LANTUS) 100 UNIT/ML injection Inject 10 Units  into the skin 2 (two) times daily.     [provider]  Melatonin 3 MG TABS Take 3 mg by mouth at bedtime.    [provider]  Multiple Vitamin (MULTIVITAMIN WITH MINERALS) TABS tablet Take 1 tablet by mouth daily.    [provider]  neomycin-bacitracin-polymyxin (NEOSPORIN) [phone] ointment Apply 1 application topically daily. Left lateral arm skin tear    [provider]  omeprazole (PRILOSEC) 20 MG capsule Take 20 mg by mouth every morning.  04/26/11   [provider]  potassium chloride SA (KLOR-CON) 20 MEQ tablet Take 20 mEq by mouth daily.    [provider]  rivaroxaban (XARELTO) 20 MG  TABS tablet Take 1 tablet (20 mg total) by mouth daily with supper. Patient taking differently: Take 20 mg by mouth daily.  07/23/16   Festus Aloe, MD  sertraline (ZOLOFT) 25 MG tablet Take 25 mg by mouth daily.    [provider]  tamsulosin (FLOMAX) 0.4 MG CAPS capsule Take 0.4 mg by mouth daily with breakfast.  01/09/14   [provider]  Vitamin D, Ergocalciferol, (DRISDOL) 1.25 MG (50000 UT) CAPS capsule Take 50,000 Units by mouth every Wednesday.    [provider]    Physical Exam: Vitals:   10/06/2019 0330 09/11/2019 0400 09/10/2019 0430 09/30/2019 0515  BP: 122/78 113/66 112/75   Pulse: 93 94 91 92  Resp: (!) 40 (!) 27 (!) 27 (!) 25  Temp:      TempSrc:      SpO2: 92% 92% 95% 90%    Constitutional: NAD, calm, comfortable Eyes: PERRL, lids and conjunctivae normal ENMT: Mucous membranes are moist. Posterior pharynx clear of any exudate or lesions.Normal dentition.  Neck: normal, supple, no masses, no thyromegaly Respiratory: clear to auscultation bilaterally, no wheezing, no crackles. Normal respiratory effort. No accessory muscle use.  Cardiovascular: Regular rate and rhythm, no murmurs / rubs / gallops. 3+ pitting edema RLE. 2+ pedal pulses. No carotid bruits.  Abdomen: no tenderness, no masses palpated. No  hepatosplenomegaly. Bowel sounds positive.  Musculoskeletal: L, BKA Skin: no rashes, lesions, ulcers. No induration Neurologic: CN 2-12 grossly intact. Sensation intact, DTR normal. Strength 5/5 in all 4.  Psychiatric: Pleasantly confused.   Labs on Admission: I have personally reviewed following labs and imaging studies  CBC: Recent Labs  Lab 09/13/2019 0345  WBC 8.6  NEUTROABS 7.9*  HGB 11.1*  HCT 40.3  MCV 87.6  PLT 774   Basic Metabolic Panel: Recent Labs  Lab 09/14/2019 0345  NA 144  K 4.4  CL 105  CO2 28  GLUCOSE 161*  BUN 77*  CREATININE 2.54*  CALCIUM 8.0*   GFR: Estimated Creatinine Clearance: 26.8 mL/min (A) (by C-G formula based on SCr of 2.54 mg/dL (H)). Liver Function Tests: Recent Labs  Lab 10/06/2019 0345  AST 46*  ALT 48*  ALKPHOS 162*  BILITOT 0.6  PROT 5.6*  ALBUMIN 2.3*   No results for input(s): LIPASE, AMYLASE in the last 168 hours. No results for input(s): AMMONIA in the last 168 hours. Coagulation Profile: No results for input(s): INR, PROTIME in the last 168 hours. Cardiac Enzymes: No results for input(s): CKTOTAL, CKMB, CKMBINDEX, TROPONINI in the last 168 hours. BNP (last 3 results) No results for input(s): PROBNP in the last 8760 hours. HbA1C: No results for input(s): HGBA1C in the last 72 hours. CBG: No results for input(s): GLUCAP in the last 168 hours. Lipid Profile: No results for input(s): CHOL, HDL, LDLCALC, TRIG, CHOLHDL, LDLDIRECT in the last 72 hours. Thyroid Function Tests: No results for input(s): TSH, T4TOTAL, FREET4, T3FREE, THYROIDAB in the last 72 hours. Anemia Panel: No results for input(s): VITAMINB12, FOLATE, FERRITIN, TIBC, IRON, RETICCTPCT in the last 72 hours. Urine analysis:    Component Value Date/Time   COLORURINE YELLOW 10/05/2019 0227   APPEARANCEUR HAZY (A) 09/17/2019 0227   LABSPEC 1.013 09/09/2019 0227   PHURINE 5.0 10/04/2019 0227   GLUCOSEU NEGATIVE 09/25/2019 0227   HGBUR NEGATIVE 09/17/2019  0227   BILIRUBINUR NEGATIVE 09/28/2019 0227   KETONESUR NEGATIVE 09/29/2019 0227   PROTEINUR 30 (A) 10/08/2019 0227   NITRITE NEGATIVE 10/06/2019 0227   LEUKOCYTESUR NEGATIVE 10/02/2019 0227  Radiological Exams on Admission: DG Chest Portable 1 View  Result Date: 09/18/2019 CLINICAL DATA:  Shortness of breath, COVID-19 positive EXAM: PORTABLE CHEST 1 VIEW COMPARISON:  Radiograph 09/17/2019 FINDINGS: Patient is rotated in a steep left anterior obliquity. Bilateral pleural effusions, similar to possibly increasing from comparison exam with mixed interstitial and patchy airspace opacity throughout both lungs slightly worsened in the right mid to upper lung. Cardiac silhouette is enlarged though partially obscured by overlying opacity with a rightward shift comparable to comparison CT. Tortuosity of the trachea is similar to prior. No visible pneumothorax. No acute osseous or soft tissue abnormality. Postsurgical changes from prior right humeral ORIF. Remote posttraumatic changes of the left acromioclavicular joint. Telemetry leads overlie the chest. IMPRESSION: Bilateral pleural effusions, similar to slightly increased from comparison. Persistent bilateral mixed interstitial and airspace disease. Could reflect edema and/or infection superimposed on areas of rounded atelectasis seen on comparison CT. Persistent cardiomegaly. Electronically Signed   By: Lovena Le M.D.   On: 10/06/2019 02:22    EKG: Independently reviewed.  Assessment/Plan Principal Problem:   Acute hypoxemic respiratory failure due to COVID-19 [facility]) Active Problems:   Chronic diastolic CHF (congestive heart failure), NYHA class 2 (HCC)   Uncontrolled diabetes mellitus (HCC)   Essential hypertension   Pleural effusion   Chronic respiratory failure with hypoxia (HCC)   Vascular dementia without behavioral disturbance (HCC)   CKD (chronic kidney disease) stage 4, GFR 15-29 ml/min (HCC)    1. Acute hypoxic resp failure due  to COVID-19 - 1. This on top of chronic hypoxic resp failure 2. 15L O2 and still only satting the upper 80s at this point. 3. COVID pathway 4. remdesivir 5. Decadron 6. CRP, procalcitonin, D.Dimer pending 7. Tylenol PRN fever 2. CKD stage 4 - 1. Daily labs 2. Strict intake and output 3. Chronic diastolic CHF - 1. Got 80mg  IV lasix in ED 2. Due to kidney function, ill hold his home lasix for the moment till we see how he responds 3. Strict intake and output 4. HTN - 1. Cont flomax for the moment 5. Pleural effusion - 1. Chronic, recurrent, and loculated 2. See pulm notes from earlier this month 3. Even with tapping looks like they only got 100cc when they tried earlier this month. 6. Vascular dementia - 1. Cont home meds 7. DM2 -  1. Cont Lantus 10u BID 2. Resistant scale SSI AC/HS (hes getting started on steroids) 8. Prognosis - 1. Prognosis probably not good, seems doubtful that hes going to survive this admission for COVID. 2. Doesn't sound like he has much QOL at baseline, even before amputation earlier this month from my review of the 09/13/2019 pal care consult note. 3. Getting pal care consult, I think hes a candidate for hospice at this point most likely thanks to [place], now requiring 15L O2 and only satting upper 80s. 1. Continue Norco PRN from SNF order, but hes denying pain anywhere at the moment and denying SOB, isnt tachypnic nor in respiratory distress at the moment.  DVT prophylaxis: Heparin per pharm Code Status: DNR/DNI Family Communication: Discussed with daughter [name] Disposition Plan: TBD Consults called: Pal care Admission status: Admit to inpatient  Severity of Illness: The appropriate patient status for this patient is INPATIENT. Inpatient status is judged to be reasonable and necessary in order to provide the required intensity of service to ensure the patient's safety. The patient's presenting symptoms, physical exam findings, and initial  radiographic and laboratory data in the context  of their chronic comorbidities is felt to place them at high risk for further clinical deterioration. Furthermore, it is not anticipated that the patient will be medically stable for discharge from the hospital within 2 midnights of admission. The following factors support the patient status of inpatient.   IP status for COVID-19 with profound o2 requirement of 15L+.   * I certify that at the point of admission it is my clinical judgment that the patient will require inpatient hospital care spanning beyond 2 midnights from the point of admission due to high intensity of service, high risk for further deterioration and high frequency of surveillance required.*    Quindarius Cabello M. DO [facility]  How to contact the [facility] Attending or Consulting provider [place] or covering provider during after hours [place], for this patient?  1. Check the care team in [facility] and look for a) attending/consulting TR[facility] provider listed and b) the [facility] team listed 2. Log into [email or web address]  Amion Physician Scheduling and messaging for groups and whole hospitals  On call and physician scheduling software for group practices, residents, hospitalists and other medical providers for call, clinic, rotation and shift schedules. On[employer] is a hospital-wide system for scheduling doctors and paging doctors on call. EasyPlot is for scientific plotting and data analysis.  [email or web address]  and use [place]'s universal password to access. If you do not have the password, please contact the hospital operator.  3. Locate the [facility] provider you are looking for under [facility] and page to a number that you can be directly reached. 4. If you still have difficulty reaching the provider, please page the [facility] (Director on Call) for the Hospitalists listed on amion for assistance.  09/15/2019, 6:16 AM

## 2019-10-03 NOTE — Progress Notes (Signed)
A consult was received from an ED physician for cefepime and vancomycin per pharmacy dosing.  The patient's profile has been reviewed for ht/wt/allergies/indication/available labs.   A one time order has been placed for Cefepime 2 Gm and Vancomycin 2 Gm .  Further antibiotics/pharmacy consults should be ordered by admitting physician if indicated.                       Thank you, Dorrene German 09/15/2019  3:21 AM

## 2019-10-03 NOTE — ED Notes (Signed)
This nurse attempted IV access x2, unsuccessful. Requested assistance from [place] IV nurse.

## 2019-10-03 NOTE — Progress Notes (Signed)
Pharmacy - IV heparin  Assessment:    Please see note from Delle Reining, PharmD earlier today for full details.  Briefly, 79 y.o. male on IV heparin for Afib   Per [facility] MAR, LD Xarelto 1/24 at 5p  Plan:   Retime heparin to start at 5p today; will need labs reordered once at [place], PharmD, BCPS [phone] 09/17/2019, 9:25 AM

## 2019-10-03 NOTE — Progress Notes (Signed)
ANTICOAGULATION CONSULT NOTE - Initial Consult  Pharmacy Consult for IV heparin Indication: A-fib, PTA xarelto   Allergies  Allergen Reactions  . Ciprofloxacin Diarrhea  . Neurontin [Gabapentin]     Hallucinations/altered mental status  . Oxycodone Other (See Comments)    Altered mental status    Patient Measurements:   Heparin Dosing Weight: 74 kg  Vital Signs: Temp: 102 F (38.9 C) (01/25 0034) Temp Source: Rectal (01/25 0034) BP: 112/75 (01/25 0430) Pulse Rate: 92 (01/25 0515)  Labs: Recent Labs    09/15/2019 0345 09/18/2019 0436  HGB 11.1*  --   HCT 40.3  --   PLT 177  --   CREATININE 2.54*  --   TROPONINIHS 352* 352*    Estimated Creatinine Clearance: 26.8 mL/min (A) (by C-G formula based on SCr of 2.54 mg/dL (H)).   Medical History: Past Medical History:  Diagnosis Date  . Anticoagulant long-term use    xarelto  . Arthritis    right hand  . At risk for sleep apnea    STOP-BANG==  6      PT ALREADY SET UP TO HAVE STUDY AT [facility] , PCP AWARE  . Atrial flutter, chronic [facility]) dx 01/ 2015   cardiologist-  dr Angelena Form  . Bilateral lower extremity edema   . BPH (benign prostatic hyperplasia)   . Cerebral microvascular disease    advanced and multiple foci throughout hemispheres and brain stem-- residual memory issues  . Chronic constipation   . Chronic kidney disease (CKD), stage III (moderate)    nephrologist-  dr Marval [facility])  . Diastolic CHF, chronic ([place])   . Exertional dyspnea   . Foley catheter in place   . GERD (gastroesophageal reflux disease)   . Glaucoma, both eyes   . History of adenomatous polyp of colon    2012  . History of CVA (cerebrovascular accident)    (pt had no symptoms other than memory issues) per MRI 11-29-2015  very small infarct right frontal lobe and multiple foci throughout hemispheres and brain stem  . 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 Hyperlipidemia   . Hypertension   . Iron  deficiency anemia   . Memory loss   . Prostate cancer ([place])   . RBBB (right bundle branch block with left anterior fascicular block)   . Rosacea   . Type 2 diabetes mellitus ([place])   . Urine retention   . Vitamin B 12 deficiency     Medications:  Scheduled:  . albuterol  2 puff Inhalation Q6H  . atorvastatin  20 mg Oral QPM  . [START ON 10/04/2019] dexamethasone (DECADRON) injection  6 mg Intravenous Daily  . donepezil  10 mg Oral QHS  . dorzolamide  1 drop Both Eyes BID  . feeding supplement (PRO-STAT SUGAR FREE 64)  30 mL Oral TID WC  . finasteride  5 mg Oral QPM  . insulin aspart  0-20 Units Subcutaneous TID WC  . insulin aspart  0-5 Units Subcutaneous QHS  . insulin glargine  10 Units Subcutaneous BID  . multivitamin with minerals  1 tablet Oral Daily  . nystatin cream   Topical BID  . pantoprazole  40 mg Oral Daily  . sertraline  25 mg Oral Daily  . tamsulosin  0.4 mg Oral Q breakfast   Infusions:  . sodium chloride    . remdesivir 200 mg in sodium chloride 0.9% 250 mL IVPB  Followed by  . [START ON 10/04/2019] remdesivir 100 mg in NS 100 mL    . vancomycin 2,000 mg (09/11/2019 0436)    Assessment: 77 yoM admitted with Covid PNA with CHF exacerbation involving recurrent pleural effusions on chronic xarelto for a-fib. MD converting to IV heparin. Baseline labs: H/H=11.1/40.3, plts = 177, INR =1.5 , aptt = 36, HL pending Unclear when patient had LD- d/t med rec pending  Goal of Therapy:  aptt = 66-102 sec Heparin level 0.3-0.7 units/ml Monitor platelets by anticoagulation protocol: Yes   Plan:  Baseline labs STAT Will start heparin drip at 1100 units/hr (no bolus) Check aptt in 8 hours F/u HL if not elevated- d/c aptt and use HL F/u med rec for LD xarelto  Dorrene German 09/18/2019,6:02 AM

## 2019-10-03 NOTE — ED Notes (Signed)
This RN attempted to call report x2 to [facility]. Unable to get in contact with RN there.

## 2019-10-04 DIAGNOSIS — L8990 Pressure ulcer of unspecified site, unspecified stage: Secondary | ICD-10-CM | POA: Insufficient documentation

## 2019-10-04 DIAGNOSIS — R0902 Hypoxemia: Secondary | ICD-10-CM

## 2019-10-04 DIAGNOSIS — E08649 Diabetes mellitus due to underlying condition with hypoglycemia without coma: Secondary | ICD-10-CM

## 2019-10-04 LAB — COMPREHENSIVE METABOLIC PANEL
ALT: 43 U/L (ref 0–44)
AST: 39 U/L (ref 15–41)
Albumin: 2.2 g/dL — ABNORMAL LOW (ref 3.5–5.0)
Alkaline Phosphatase: 149 U/L — ABNORMAL HIGH (ref 38–126)
Anion gap: 13 (ref 5–15)
BUN: 91 mg/dL — ABNORMAL HIGH (ref 8–23)
CO2: 28 mmol/L (ref 22–32)
Calcium: 8.1 mg/dL — ABNORMAL LOW (ref 8.9–10.3)
Chloride: 104 mmol/L (ref 98–111)
Creatinine, Ser: 2.62 mg/dL — ABNORMAL HIGH (ref 0.61–1.24)
GFR calc Af Amer: 26 mL/min — ABNORMAL LOW (ref >60)
GFR calc non Af Amer: 23 mL/min — ABNORMAL LOW (ref >60)
Glucose, Bld: 235 mg/dL — ABNORMAL HIGH (ref 70–99)
Potassium: 4.1 mmol/L (ref 3.5–5.1)
Sodium: 145 mmol/L (ref 135–145)
Total Bilirubin: 0.7 mg/dL (ref 0.3–1.2)
Total Protein: 5.6 g/dL — ABNORMAL LOW (ref 6.5–8.1)

## 2019-10-04 LAB — C-REACTIVE PROTEIN: CRP: 12.5 mg/dL — ABNORMAL HIGH (ref <1.0)

## 2019-10-04 LAB — CBC
HCT: 40.3 % (ref 39.0–52.0)
Hemoglobin: 11.2 g/dL — ABNORMAL LOW (ref 13.0–17.0)
MCH: 24.2 pg — ABNORMAL LOW (ref 26.0–34.0)
MCHC: 27.8 g/dL — ABNORMAL LOW (ref 30.0–36.0)
MCV: 87.2 fL (ref 80.0–100.0)
Platelets: 176 10*3/uL (ref 150–400)
RBC: 4.62 MIL/uL (ref 4.22–5.81)
RDW: 18.8 % — ABNORMAL HIGH (ref 11.5–15.5)
WBC: 8.1 10*3/uL (ref 4.0–10.5)
nRBC: 0.2 % (ref 0.0–0.2)

## 2019-10-04 LAB — URINE CULTURE: Culture: <10000 — AB

## 2019-10-04 LAB — GLUCOSE, CAPILLARY
Glucose-Capillary: 290 mg/dL — ABNORMAL HIGH (ref 70–99)
Glucose-Capillary: 231 mg/dL — ABNORMAL HIGH (ref 70–99)
Glucose-Capillary: 226 mg/dL — ABNORMAL HIGH (ref 70–99)
Glucose-Capillary: 422 mg/dL — ABNORMAL HIGH (ref 70–99)
Glucose-Capillary: 465 mg/dL — ABNORMAL HIGH (ref 70–99)
Glucose-Capillary: 379 mg/dL — ABNORMAL HIGH (ref 70–99)

## 2019-10-04 LAB — D-DIMER, QUANTITATIVE: D-Dimer, Quant: 2.67 ug/mL-FEU — ABNORMAL HIGH (ref 0.00–0.50)

## 2019-10-04 LAB — CULTURE, BLOOD (ROUTINE X 2)

## 2019-10-04 MED ORDER — ZINC OXIDE 40 % EX OINT
TOPICAL_OINTMENT | Freq: Four times a day (QID) | CUTANEOUS | Status: DC
  Filled 2019-10-04: qty 57

## 2019-10-04 MED ORDER — METHYLPREDNISOLONE SODIUM SUCC 125 MG IJ SOLR
50.0000 mg | Freq: Two times a day (BID) | INTRAMUSCULAR | Status: DC
  Administered 2019-10-04 – 2019-10-07 (×7): 50 mg via INTRAVENOUS
  Filled 2019-10-04 (×7): qty 2

## 2019-10-04 NOTE — Progress Notes (Signed)
PROGRESS NOTE  Darrell Mcclain ZDG:[id number] DOB: 20-Sep-1940 DOA: 09/23/2019 PCP: Burnard Bunting, MD   LOS: 1 day   Brief Narrative / Interim history: 79 year old male with DM 2, vascular dementia, chronic diastolic CHF, CKD 4, paroxysmal A. 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   Subjective / 24h Interval events: Appears well this morning, smiling. Denies shortness of breath, no chest pain, abdominal pain, nausea / vomiting  Assessment & Plan:  Principal Problem Acute Hypoxic Respiratory Failure due to Covid-19 Viral Illness -Patient started on remdesivir, scheduled to finish 5 days on 1/29 -Started on steroids, on Solu-Medrol -Given chronic loculated pleural effusions, concern for chronic osteomyelitis, very poor prognosis, not a candidate for Actemra -Profoundly hypoxic on 15 L this morning, NRB on top but he appears to be comfortable denying dyspnea. Happy hypoxic.    COVID-19 Labs  Recent Labs    09/26/2019 0544 10/04/19 0302  DDIMER 2.35* 2.67*  CRP 11.7* 12.5*    Lab Results  Component Value Date   SARSCOV2NAA NEGATIVE 09/21/2019   [place] NEGATIVE 09/10/2019   Active Problems Chronic kidney disease stage IV -baseline Creatinine ~2.5-2.7, overall stable, avoid nephrotoxins and continue to monitor, Cr slightly up today but within his baseline.   Chronic diastolic CHF -Got 80 mg of Lasix in the ED, hold further Lasix, appears auvolemic  Essential hypertension -blood pressure stable  Chronic pleural effusion, loculated -Monitor, afebrile, WNC normal  Paroxysmal a fib / flutter, currently in sinus -Chronic Xarelto  Vascular dementia -stable  DM2 -continue SSI  CBG (last 3)  Recent Labs   10/02/2019 0828 10/05/2019 1141 09/14/2019 2155  GLUCAP 147* 172* 252*   Hyperlipidemia -Continue statin  Goals of care -Discussed with the daughter [name] [phone] over the phone, confirmed DNR, agreeable with medical management would avoid escalation of care, ICU, ventilator, if he is to decompensate and get worse we will focus on comfort care   Scheduled Meds: . albuterol  2 puff Inhalation Q6H  . atorvastatin  20 mg Oral QPM  . dexamethasone (DECADRON) injection  6 mg Intravenous Daily  . donepezil  10 mg Oral QHS  . dorzolamide  1 drop Both Eyes BID  . feeding supplement (PRO-STAT SUGAR FREE 64)  30 mL Oral TID WC  . finasteride  5 mg Oral QPM  . insulin aspart  0-20 Units Subcutaneous TID WC  . insulin aspart  0-5 Units Subcutaneous QHS  . insulin glargine  10 Units Subcutaneous BID  . multivitamin with minerals  1 tablet Oral Daily  . nystatin cream   Topical BID  . pantoprazole  40 mg Oral Daily  . rivaroxaban  15 mg Oral Q supper  . sertraline  25 mg Oral Daily  . tamsulosin  0.4 mg Oral Q breakfast   Continuous Infusions: . remdesivir 100 mg in NS 100 mL     PRN Meds:.acetaminophen, chlorpheniramine-HYDROcodone, guaiFENesin-dextromethorphan, HYDROcodone-acetaminophen, ondansetron **OR** ondansetron (ZOFRAN) IV  DVT prophylaxis: On chronic Xarelto Code Status: DNR/DNI Family Communication: Daughter over the phone Patient admitted from: SNF Anticipated d/c place: To be determined Barriers to d/c: Profound hypoxia  Consultants:  Palliative   Procedures:  None   Microbiology: None   Antimicrobials: None    Objective: Vitals:   09/27/2019 1626 09/21/2019 2000 09/12/2019  2300 10/04/19 0300  BP: 116/72 106/64 102/67 (!) 135/58  Pulse: 81 60    Resp: 14 20  20   Temp:  97.8 F (36.6 C) (!) 96.8 F (36 C) (!) 97.4 F (36.3 C)  TempSrc:  Oral Axillary Oral  SpO2:  90%  90%  Weight: 92.6 kg     Height: 5\' 7"  (1.702 m)       Intake/Output Summary  (Last 24 hours) at 10/04/2019 0703 Last data filed at 10/04/2019 0600 Gross per 24 hour  Intake --  Output 251 ml  Net -251 ml   Filed Weights   10/02/2019 1626  Weight: 92.6 kg    Examination:  Constitutional: NAD Eyes: no scleral icterus ENMT: Mucous membranes are moist.  Neck: normal, supple Respiratory: clear to auscultation bilaterally, no wheezing, no crackles. Normal respiratory effort. No accessory muscle use.  Cardiovascular: Regular rate and rhythm, no murmurs / rubs / gallops. No LE edema. Good peripheral pulses Abdomen: non distended, no tenderness. Bowel sounds positive.  Musculoskeletal: no clubbing / cyanosis.  Skin: no rashes Neurologic: CN 2-12 grossly intact. Strength 5/5 in all 4.  Psychiatric: Normal judgment and insight. Alert and oriented x 3. Normal mood.    Data Reviewed: I have independently reviewed following labs and imaging studies   CBC: Recent Labs  Lab 09/11/2019 0345 10/04/19 0302  WBC 8.6 8.1  NEUTROABS 7.9*  --   HGB 11.1* 11.2*  HCT 40.3 40.3  MCV 87.6 87.2  PLT 177 347   Basic Metabolic Panel: Recent Labs  Lab 09/13/2019 0345 10/04/19 0302  NA 144 145  K 4.4 4.1  CL 105 104  CO2 28 28  GLUCOSE 161* 235*  BUN 77* 91*  CREATININE 2.54* 2.62*  CALCIUM 8.0* 8.1*   GFR: Estimated Creatinine Clearance: 26 mL/min (A) (by C-G formula based on SCr of 2.62 mg/dL (H)). Liver Function Tests: Recent Labs  Lab 09/15/2019 0345 10/04/19 0302  AST 46* 39  ALT 48* 43  ALKPHOS 162* 149*  BILITOT 0.6 0.7  PROT 5.6* 5.6*  ALBUMIN 2.3* 2.2*   No results for input(s): LIPASE, AMYLASE in the last 168 hours. No results for input(s): AMMONIA in the last 168 hours. Coagulation Profile: Recent Labs  Lab 09/20/2019 0629  INR 1.5*   Cardiac Enzymes: No results for input(s): CKTOTAL, CKMB, CKMBINDEX, TROPONINI in the last 168 hours. BNP (last 3 results) No results for input(s): PROBNP in the last 8760 hours. HbA1C: No results for input(s):  HGBA1C in the last 72 hours. CBG: Recent Labs  Lab 09/30/2019 0828 10/02/2019 1141 09/21/2019 2155  GLUCAP 147* 172* 252*   Lipid Profile: No results for input(s): CHOL, HDL, LDLCALC, TRIG, CHOLHDL, LDLDIRECT in the last 72 hours. Thyroid Function Tests: No results for input(s): TSH, T4TOTAL, FREET4, T3FREE, THYROIDAB in the last 72 hours. Anemia Panel: No results for input(s): VITAMINB12, FOLATE, FERRITIN, TIBC, IRON, RETICCTPCT in the last 72 hours. Urine analysis:    Component Value Date/Time   COLORURINE YELLOW 09/09/2019 0227   APPEARANCEUR HAZY (A) 09/25/2019 0227   LABSPEC 1.013 09/20/2019 0227   PHURINE 5.0 09/10/2019 0227   GLUCOSEU NEGATIVE 09/10/2019 0227   HGBUR NEGATIVE 09/13/2019 0227   BILIRUBINUR NEGATIVE 09/27/2019 0227   KETONESUR NEGATIVE 09/23/2019 0227   PROTEINUR 30 (A) 09/22/2019 0227   NITRITE NEGATIVE 10/05/2019 0227   LEUKOCYTESUR NEGATIVE 09/11/2019 0227   Sepsis Labs: Invalid input(s): PROCALCITONIN, LACTICIDVEN  Recent Results (from the past 240 hour(s))  Culture, blood (routine x  2)     Status: None (Preliminary result)   Collection Time: 09/26/2019  2:27 AM   Specimen: BLOOD  Result Value Ref Range Status   Specimen Description   Final    BLOOD Performed at [facility] [address] [place], [place] [address]    Special Requests   Final    BOTTLES DRAWN AEROBIC AND ANAEROBIC Blood Culture results may not be optimal due to an inadequate volume of blood received in culture bottles Performed at [facility] [address], [place], [place] [address]    Culture  Setup Time   Final    AEROBIC BOTTLE ONLY GRAM POSITIVE COCCI CRITICAL RESULT CALLED TO, READ BACK BY AND VERIFIED WITH: Shellee Milo [facility] 10/04/19 0027 JDW Performed at [facility], [address] [place], [place] [address]    Culture PENDING  Incomplete   Report Status PENDING  Incomplete      Radiology Studies: DG CHEST PORT 1  VIEW  Result Date: 10/02/2019 CLINICAL DATA:  Hypoxemia EXAM: PORTABLE CHEST 1 VIEW COMPARISON:  09/11/2019 FINDINGS: Cardiomegaly with vascular congestion. Bilateral interstitial prominence and airspace disease again noted, right greater than left. Right pleural effusion is stable. Improvement of the left pleural effusion. Old left rib fractures again noted, unchanged. IMPRESSION: Bilateral interstitial and airspace opacities, right greater than left, stable since prior study. Stable small right effusion.  Improved left effusion. Electronically Signed   By: Rolm Baptise M.D.   On: 10/02/2019 19:09   DG Chest Portable 1 View  Result Date: 09/29/2019 CLINICAL DATA:  Shortness of breath, COVID-19 positive EXAM: PORTABLE CHEST 1 VIEW COMPARISON:  Radiograph 09/17/2019 FINDINGS: Patient is rotated in a steep left anterior obliquity. Bilateral pleural effusions, similar to possibly increasing from comparison exam with mixed interstitial and patchy airspace opacity throughout both lungs slightly worsened in the right mid to upper lung. Cardiac silhouette is enlarged though partially obscured by overlying opacity with a rightward shift comparable to comparison CT. Tortuosity of the trachea is similar to prior. No visible pneumothorax. No acute osseous or soft tissue abnormality. Postsurgical changes from prior right humeral ORIF. Remote posttraumatic changes of the left acromioclavicular joint. Telemetry leads overlie the chest. IMPRESSION: Bilateral pleural effusions, similar to slightly increased from comparison. Persistent bilateral mixed interstitial and airspace disease. Could reflect edema and/or infection superimposed on areas of rounded atelectasis seen on comparison CT. Persistent cardiomegaly. Electronically Signed   By: Lovena Le M.D.   On: 09/24/2019 02:22   Marzetta Board, MD, PhD [facility]  Between 7 am - 7 pm I am available, please contact me via Amion or Securechat  Between 7 pm - 7  am I am not available, please contact night coverage MD/APP via Amion

## 2019-10-04 NOTE — Consult Note (Addendum)
[place] Nurse Consult Note: Patient receiving care in [place]. Patient is COVID +. Reason for Consult: L BKA, right heel, sacrum, bilateral arm skin tears, groin/buttocks excoriation. Wound type: LBKA is a healing surgical wound that needs Aquacel along the incision line where there is minimal pink, superficial, scattered wounds.  The right heel has a small, superficial wound that is pink, for which I have ordered Xeroform gauze and kerlex, as well as a Prevalon boot. I have initiated the Skin Care Order Set for skin tears orders initiated for the arms. Pressure Injury POA: Yes/No/NA Measurement: Wound bed: Drainage (amount, consistency, odor)  Periwound: Dressing procedure/placement/frequency:  Cut Aquacel into strips and place those along the L BKA wound. Top with ABD pad and secure with kerlex. Change daily.  Place Xeroform over skin tears and secure with kerlex. Change daily.  When a photo of the buttock excoriation is available, orders will be entered for that area as well.  The primary RN, [facility], has agreed to try to obtain photos of the buttocks today. AT 1300 orders for QID application of Desitin were entered for the buttocks, which has been impacted by MASD resulting in partial thickness tissue loss to left buttock.  Monitor the wound area(s) for worsening of condition such as: Signs/symptoms of infection,  Increase in size,  Development of or worsening of odor, Development of pain, or increased pain at the affected locations.  Notify the medical team if any of these develop.  Thank you for the consult.  Discussed plan of care with the bedside nurse.  [facility] nurse will not follow at this time.  Please re-consult the [place] team if needed.  Val Riles, RN, MSN, CWOCN, CNS-BC, pager [phone]

## 2019-10-04 NOTE — Evaluation (Signed)
Clinical/Bedside Swallow Evaluation Patient Details  Name: Darrell Mcclain MRN: [id number] Date of Birth: 03-26-1941  Today's Date: 10/04/2019 Time: SLP Start Time (ACUTE ONLY): 1200 SLP Stop Time (ACUTE ONLY): 1214 SLP Time Calculation (min) (ACUTE ONLY): 14 min  Past Medical History:  Past Medical History:  Diagnosis Date  . Anticoagulant long-term use    xarelto  . Arthritis    right hand  . At risk for sleep apnea    STOP-BANG==  6      PT ALREADY SET UP TO HAVE STUDY AT [facility] , PCP AWARE  . Atrial flutter, chronic [facility]) dx 01/ 2015   cardiologist-  dr Angelena Form  . Bilateral lower extremity edema   . BPH (benign prostatic hyperplasia)   . Cerebral microvascular disease    advanced and multiple foci throughout hemispheres and brain stem-- residual memory issues  . Chronic constipation   . Chronic kidney disease (CKD), stage III (moderate)    nephrologist-  dr Marval Regal [facility])  . Diastolic CHF, chronic ([place])   . Exertional dyspnea   . Foley catheter in place   . GERD (gastroesophageal reflux disease)   . Glaucoma, both eyes   . History of adenomatous polyp of colon    2012  . History of CVA (cerebrovascular accident)    (pt had no symptoms other than memory issues) per MRI 11-29-2015  very small infarct right frontal lobe and multiple foci throughout hemispheres and brain stem  . 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 Hyperlipidemia   . Hypertension   . Iron deficiency anemia   . Memory loss   . Prostate cancer ([place])   . RBBB (right bundle branch block with left anterior fascicular block)   . Rosacea   . Type 2 diabetes mellitus ([place])   . Urine retention   . Vitamin B 12 deficiency    Past Surgical History:  Past Surgical History:  Procedure Laterality Date  . AMPUTATION Left 09/14/2019   Procedure: LEFT BELOW KNEE AMPUTATION;  Surgeon: Newt Minion, MD;  Location: [facility];  Service: Orthopedics;  Laterality: Left;  . CATARACT  EXTRACTION W/ INTRAOCULAR LENS  IMPLANT, BILATERAL  2016  . IR THORACENTESIS ASP PLEURAL SPACE W/IMG GUIDE  12/01/2018  . ORIF PROXIMAL HUMEROUS FX AND BICEPS TENODESIS  11/09/2015  . OTHER SURGICAL HISTORY  1996   reconstruction left toe (amputation repair)  . PROSTATE BIOPSY N/A 07/22/2016   Procedure: BIOPSY TRANSRECTAL ULTRASONIC PROSTATE (TUBP);  Surgeon: Festus Aloe, MD;  Location: [facility];  Service: Urology;  Laterality: N/A;  . TONSILLECTOMY  1951  . TRANSTHORACIC ECHOCARDIOGRAM  08/23/2015   mild LVH, ef 55-60%, grade 1 diastolitc dysfunction  . WISDOM TOOTH EXTRACTION  19877   HPI:  79 year old male with DM 2, vascular dementia, chronic diastolic CHF, CKD 4, paroxysmal A. 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 He is alert and pleasant and denies discomfort. With brief removal of mask for sips and bites, he demonstrates no espisodes of desaturation and no signs of aspiration. He does have slightly prolonged  mastication and needs a liquid wash or bite of puree to initiate transit. Will modify diet to finely chopped/ground with thin liquids for energy conservation. Aspiration risk is present, but not overt and pt has an appetite and still enjoys PO. Will sign off at this time as skilled intervention not needed.  SLP Visit Diagnosis: Dysphagia, oropharyngeal phase (R13.12)    Aspiration Risk  Mild aspiration risk    Diet Recommendation Dysphagia 2 (Fine chop);Thin liquid   Liquid Administration via: Cup;Straw Medication Administration: Whole meds with liquid Supervision:  Full supervision/cueing for compensatory strategies;Staff to assist with self feeding Compensations: Slow rate;Small sips/bites Postural Changes: Seated upright at 90 degrees    Other  Recommendations Oral Care Recommendations: Oral care BID   Follow up Recommendations None      Frequency and Duration            Prognosis        Swallow Study   General HPI: 79 year old male with DM 2, vascular dementia, chronic diastolic CHF, CKD 4, paroxysmal A. 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 and progression of his chronic kidney disease, discharged to SNF comes into the hospital with hypoxia found to have COVID-19 Type of Study: Bedside Swallow Evaluation Previous Swallow Assessment: none Diet Prior to this Study: Regular;Thin liquids Temperature Spikes Noted: No Respiratory Status: Nasal cannula;Non-rebreather History of Recent Intubation: No Behavior/Cognition: Alert;Cooperative;Pleasant mood Oral Cavity Assessment: Within Functional Limits Oral Care Completed by SLP: No Oral Cavity - Dentition: Adequate natural dentition Vision: Impaired for self-feeding Self-Feeding Abilities: Total assist Patient Positioning: Upright in bed Baseline Vocal Quality: Normal Volitional Cough: Strong Volitional Swallow: Able to elicit    Oral/Motor/Sensory Function Overall Oral Motor/Sensory Function: Within functional limits   Ice Chips Ice chips: Not tested   Thin Liquid Thin Liquid: Within functional limits Presentation: Cup;Straw    Nectar Thick Nectar Thick Liquid: Not tested   Honey Thick Honey Thick Liquid: Not tested   Puree Puree: Within functional limits Presentation: Spoon   Solid     Solid: Impaired Oral Phase Functional Implications: Impaired mastication;Oral residue;Prolonged oral transit     Herbie [place], MA [place] Pager [phone] Office [phone]  Othelia Pulling, Katherene Ponto 10/04/2019,12:27 PM

## 2019-10-04 NOTE — Progress Notes (Addendum)
2158: Assumed care of Pt from night RN. Pt alert, pleasantly confused, denies pain, HR 90s Afib, BP 96/68, SpO2 88-90% on 15 NRB and 14L HFNC. Left BKA leaking serous, right heel wound with minimal drainage, bilateral arms weeping which Pt says, "I do that a lot." Male external pouch in place. Pt requesting water/food, but explained that Speech must see him first to assure safety. Repositioned, call bell within reach.   1000: Bath completed. Full bed change. All wound dressings changed. Was able to upload pics of some of the wounds. Consult placed for WOCN  1215: Pt remains pleasantly confused, denies complaints at this time. Happy to be cleared by speech for PO intake. Remains on high O2 requirements. Repositioned, will monitor  1345: [name] daughter updated over phone, all questions answered  1720: MD made aware FS 422. Will monitor   1755: Repositioned, denies other needs at this time, call bell within reach

## 2019-10-04 NOTE — Progress Notes (Signed)
Inpatient Diabetes Program Recommendations  AACE/ADA: New Consensus Statement on Inpatient Glycemic Control (2015)  Target Ranges:  Prepandial:   less than 140 mg/dL      Peak postprandial:   less than 180 mg/dL (1-2 hours)      Critically ill patients:  140 - 180 mg/dL   Lab Results  Component Value Date   GLUCAP 231 (H) 10/04/2019   HGBA1C 6.8 (H) 09/09/2019    Review of Glycemic Control  Diabetes history: DM 2 Outpatient Diabetes medications: Lantus 10 units bid, Novolog 2-11 units tid Current orders for Inpatient glycemic control:  Lantus 10 units bid Novolog 0-20 units tid + hs  Solumedrol 50 mg Q12 hours A1c 6.8% on 1/1  Inpatient Diabetes Program Recommendations:    Consider adding Tradjenta 5 mg Daily (new evidence drug reduces mortality in DM 2)  Thanks,  Tama Headings RN, MSN, BC-ADM Inpatient Diabetes Coordinator Team Pager [phone] (8a-5p)

## 2019-10-04 NOTE — Progress Notes (Signed)
RT called to see pt d/t desat.  RT asked RN to turn flow all the way up on NRB and HFNC and allow pt to recover until RT could come.  On arrival pt was on both devices maxed out.  Pt sat was 90% with hardly any WOB.  RT decreased HFNC to 6lpm to observe.  Pt desat to 85%, still not symptomatic.  RT will continue to monitor.

## 2019-10-04 NOTE — Discharge Instructions (Signed)

## 2019-10-05 ENCOUNTER — Inpatient Hospital Stay (HOSPITAL_COMMUNITY): Payer: Medicare Other

## 2019-10-05 DIAGNOSIS — I340 Nonrheumatic mitral (valve) insufficiency: Secondary | ICD-10-CM

## 2019-10-05 DIAGNOSIS — Z515 Encounter for palliative care: Secondary | ICD-10-CM

## 2019-10-05 DIAGNOSIS — I351 Nonrheumatic aortic (valve) insufficiency: Secondary | ICD-10-CM

## 2019-10-05 DIAGNOSIS — I361 Nonrheumatic tricuspid (valve) insufficiency: Secondary | ICD-10-CM

## 2019-10-05 DIAGNOSIS — Z7189 Other specified counseling: Secondary | ICD-10-CM

## 2019-10-05 LAB — CBC
HCT: 40.1 % (ref 39.0–52.0)
Hemoglobin: 11.3 g/dL — ABNORMAL LOW (ref 13.0–17.0)
MCH: 24.1 pg — ABNORMAL LOW (ref 26.0–34.0)
MCHC: 28.2 g/dL — ABNORMAL LOW (ref 30.0–36.0)
MCV: 85.5 fL (ref 80.0–100.0)
Platelets: 158 10*3/uL (ref 150–400)
RBC: 4.69 MIL/uL (ref 4.22–5.81)
RDW: 18.8 % — ABNORMAL HIGH (ref 11.5–15.5)
WBC: 10.7 10*3/uL — ABNORMAL HIGH (ref 4.0–10.5)
nRBC: 0 % (ref 0.0–0.2)

## 2019-10-05 LAB — GLUCOSE, CAPILLARY
Glucose-Capillary: 200 mg/dL — ABNORMAL HIGH (ref 70–99)
Glucose-Capillary: 211 mg/dL — ABNORMAL HIGH (ref 70–99)
Glucose-Capillary: 120 mg/dL — ABNORMAL HIGH (ref 70–99)
Glucose-Capillary: 102 mg/dL — ABNORMAL HIGH (ref 70–99)

## 2019-10-05 LAB — COMPREHENSIVE METABOLIC PANEL
ALT: 40 U/L (ref 0–44)
AST: 30 U/L (ref 15–41)
Albumin: 2.1 g/dL — ABNORMAL LOW (ref 3.5–5.0)
Alkaline Phosphatase: 152 U/L — ABNORMAL HIGH (ref 38–126)
Anion gap: 12 (ref 5–15)
BUN: 99 mg/dL — ABNORMAL HIGH (ref 8–23)
CO2: 28 mmol/L (ref 22–32)
Calcium: 8.1 mg/dL — ABNORMAL LOW (ref 8.9–10.3)
Chloride: 105 mmol/L (ref 98–111)
Creatinine, Ser: 2.74 mg/dL — ABNORMAL HIGH (ref 0.61–1.24)
GFR calc Af Amer: 25 mL/min — ABNORMAL LOW (ref >60)
GFR calc non Af Amer: 22 mL/min — ABNORMAL LOW (ref >60)
Glucose, Bld: 266 mg/dL — ABNORMAL HIGH (ref 70–99)
Potassium: 3.8 mmol/L (ref 3.5–5.1)
Sodium: 145 mmol/L (ref 135–145)
Total Bilirubin: 0.9 mg/dL (ref 0.3–1.2)
Total Protein: 5.2 g/dL — ABNORMAL LOW (ref 6.5–8.1)

## 2019-10-05 LAB — URINALYSIS, ROUTINE W REFLEX MICROSCOPIC
Bacteria, UA: NONE SEEN
Bilirubin Urine: NEGATIVE
Glucose, UA: NEGATIVE mg/dL
Ketones, ur: NEGATIVE mg/dL
Leukocytes,Ua: NEGATIVE
Nitrite: NEGATIVE
Protein, ur: 30 mg/dL — AB
Specific Gravity, Urine: 1.015 (ref 1.005–1.030)
pH: 5 (ref 5.0–8.0)

## 2019-10-05 LAB — MAGNESIUM: Magnesium: 2.2 mg/dL (ref 1.7–2.4)

## 2019-10-05 LAB — CREATININE, URINE, RANDOM: Creatinine, Urine: 61.26 mg/dL

## 2019-10-05 LAB — D-DIMER, QUANTITATIVE: D-Dimer, Quant: 3.77 ug{FEU}/mL — ABNORMAL HIGH (ref 0.00–0.50)

## 2019-10-05 LAB — PROCALCITONIN: Procalcitonin: 0.73 ng/mL

## 2019-10-05 LAB — C-REACTIVE PROTEIN: CRP: 7.7 mg/dL — ABNORMAL HIGH (ref <1.0)

## 2019-10-05 LAB — ECHOCARDIOGRAM COMPLETE
Height: 67 in
Weight: 3266.34 oz

## 2019-10-05 LAB — SODIUM, URINE, RANDOM: Sodium, Ur: <10 mmol/L

## 2019-10-05 LAB — PHOSPHORUS: Phosphorus: 4.7 mg/dL — ABNORMAL HIGH (ref 2.5–4.6)

## 2019-10-05 MED ORDER — FUROSEMIDE 10 MG/ML IJ SOLN
100.0000 mg | Freq: Once | INTRAVENOUS | Status: AC
  Administered 2019-10-05: 14:00:00 100 mg via INTRAVENOUS
  Filled 2019-10-05: qty 10

## 2019-10-05 MED ORDER — LIDOCAINE HCL URETHRAL/MUCOSAL 2 % EX GEL
1.0000 "application " | CUTANEOUS | Status: DC | PRN
  Filled 2019-10-05: qty 5

## 2019-10-05 MED ORDER — FENTANYL CITRATE (PF) 100 MCG/2ML IJ SOLN
25.0000 ug | INTRAMUSCULAR | Status: DC | PRN
  Administered 2019-10-05 – 2019-10-07 (×3): 25 ug via INTRAVENOUS
  Filled 2019-10-05 (×3): qty 2

## 2019-10-05 MED ORDER — POTASSIUM CHLORIDE CRYS ER 20 MEQ PO TBCR
40.0000 meq | EXTENDED_RELEASE_TABLET | Freq: Once | ORAL | Status: AC
  Administered 2019-10-05: 17:00:00 40 meq via ORAL
  Filled 2019-10-05: qty 2

## 2019-10-05 MED ORDER — SODIUM CHLORIDE 0.9 % IV SOLN
2.0000 g | INTRAVENOUS | Status: DC
  Administered 2019-10-05 – 2019-10-06 (×2): 2 g via INTRAVENOUS
  Filled 2019-10-05 (×2): qty 2

## 2019-10-05 MED ORDER — LIDOCAINE HCL URETHRAL/MUCOSAL 2 % EX GEL
1.0000 "application " | Freq: Once | CUTANEOUS | Status: AC
  Administered 2019-10-05: 1 via URETHRAL
  Filled 2019-10-05: qty 5

## 2019-10-05 MED ORDER — INSULIN GLARGINE 100 UNIT/ML ~~LOC~~ SOLN
13.0000 [IU] | Freq: Two times a day (BID) | SUBCUTANEOUS | Status: DC
  Administered 2019-10-05 (×2): 13 [IU] via SUBCUTANEOUS
  Filled 2019-10-05 (×3): qty 0.13

## 2019-10-05 MED ORDER — INSULIN ASPART 100 UNIT/ML ~~LOC~~ SOLN
4.0000 [IU] | Freq: Three times a day (TID) | SUBCUTANEOUS | Status: DC
  Administered 2019-10-05: 4 [IU] via SUBCUTANEOUS

## 2019-10-05 MED ORDER — CHLORHEXIDINE GLUCONATE CLOTH 2 % EX PADS
6.0000 | MEDICATED_PAD | Freq: Every day | CUTANEOUS | Status: DC
  Administered 2019-10-06 – 2019-10-07 (×2): 6 via TOPICAL

## 2019-10-05 NOTE — Progress Notes (Addendum)
Inpatient Diabetes Program Recommendations  AACE/ADA: New Consensus Statement on Inpatient Glycemic Control (2015)  Target Ranges:  Prepandial:   less than 140 mg/dL      Peak postprandial:   less than 180 mg/dL (1-2 hours)      Critically ill patients:  140 - 180 mg/dL   Lab Results  Component Value Date   GLUCAP 200 (H) 10/05/2019   HGBA1C 6.8 (H) 09/09/2019    Review of Glycemic Control Results for JANSEL, VONSTEIN (MRN [id number]) as of 10/05/2019 09:32  Ref. Range 10/04/2019 08:06 10/04/2019 12:18 10/04/2019 17:07 10/04/2019 19:39 10/04/2019 21:57 10/05/2019 07:40  Glucose-Capillary Latest Ref Range: 70 - 99 mg/dL 231 (H)  Novolog 7 units  Lantus 10 units  Solumedrol 50 mg 226 (H)  Novolog 7 units 422 (H)  Novolog 20 units     Solumedrol 50 mg given at 1802 pm 465 (H) 379 (H)  Novolog 5 units  Lantus 10 units 200 (H)  Novolog 4 units     Solumedrol 50 mg   Diabetes history: DM 2 Outpatient Diabetes medications: Lantus 10 units bid, Novolog 2-11 units tid Current orders for Inpatient glycemic control:  Lantus 13 units bid Novolog 0-20 units tid + hs Novolog 4 units tid meal coverage  Solumedrol 50 mg Q12 hours A1c 6.8% on 1/1  Inpatient Diabetes Program Recommendations:    Noted Lantus increased to 13 units bid and Novolog 4 units tid meal coverage added.  Consider adding Tradjenta 5 mg Daily (new evidence drug reduces mortality in DM 2)  May consider increasing Novolog meal coverage to 8 units tid.  Thanks,  Tama Headings RN, MSN, BC-ADM Inpatient Diabetes Coordinator Team Pager [phone] (8a-5p)

## 2019-10-05 NOTE — Consult Note (Signed)
Consultation Note Date: 10/05/2019   Patient Name: Darrell Mcclain  DOB: 01-05-1941  MRN: [id number]  Age / Sex: 79 y.o., male  PCP: Burnard Bunting, MD Referring Physician: Elodia Florence., *  Reason for Consultation: Establishing goals of care  HPI/Patient Profile: 79 y.o. male  with past medical history of vascular dementia, DM type 2, chronic diastolic CHF, CKD stage 4, paroxysmal afib on xarelto, hypoxic respiratory failure on 2L [place], chronic loculated pleural effusions, and recent admission for osteomyelitis of foot s/p BKA and progression of CKD discharged to SNF admitted on 09/30/2019 with hypoxia. Found to have COVID-19 requiring 15L HFNC. Dr. Cruzita Lederer spoke with daughter confirming DNR code status, agreeable with medical management but no escalation of care ICU/vent if decompensation. Palliative medicine consultation for goals of care/hospice eligibility.   Clinical Assessment and Goals of Care:  I have reviewed medical records and discussed with care team. Per RN, patient is less interactive today compared to yesterday. He is disoriented and pleasantly confused with baseline dementia. Poor oral intake. Remains on 15L NRB with sats mid 80's. Per RN, he does not appear to be in distress or discomfort.  This afternoon, spoke with daughter Darrell Mcclain) via telephone to discuss diagnosis, prognosis, [place], and EOL wishes.  Introduced Palliative Medicine as specialized medical care for people living with serious illness. It focuses on providing relief from the symptoms and stress of a serious illness.  We discussed a brief life review of the patient. Divorced. Two daughters. Darrell Mcclain reports his health has been declining in the last two years. Family kept him at home as long as possible. Moved into ALF in November 2020 and his health has continued to go "downhill." Recent admission for osteomyelitis s/p left  BKA and discharge to SNF for rehab.   Discussed course of hospitalization including diagnoses, interventions, plan of care and concerns with poor prognosis.  I attempted to elicit values and goals of care important to the patient and family. Advanced directives, concepts specific to code status, artifical feeding and hydration were discussed. Darrell Mcclain confirms decision for DNR/DNI code status and no escalation of care if her father's condition worsened. "Don't want to go to extreme measures" and "he wouldn't want to live like this." She shares that he and family agree that he has been "miserable" and recently told Darrell Mcclain he wanted to die. Discussed quality of life aspects.   We discussed watchful waiting and continuing medical management for at least the next two days (complete course of remdesivir). Discussed many reasons for poor prognosis including high oxygen requirements. Discussed worsening dementia with underlying acute illness and chronic conditions. Darrell Mcclain states "we aren't in shock" sharing that her and family acknowledge that his health has significantly declined, especially in the last few months.   Briefly discussed comfort focused pathway and hospice if he fails to show improvement despite aggressive medical management. Answered questions and concerns. Reassured of ongoing support from palliative. Daughter has PMT contact information.    SUMMARY OF RECOMMENDATIONS    Daughter, Darrell Mcclain, confirms  DNR/DNI code status and no escalation of care.  Continue current plan of care and medical management per attending.   Daughter does seem realistic with poor prognosis, and if he fails to show improvement in the next few days, she would be open to comfort route/hospice discussions.  PMT will follow.   Code Status/Advance Care Planning:  DNR  Symptom Management:   Per attending  Palliative Prophylaxis:   Aspiration, Delirium Protocol, Oral Care and Turn  Reposition  Psycho-social/Spiritual:   Desire for further Chaplaincy support: yes  Additional Recommendations: Caregiving  Support/Resources  Prognosis:   Poor prognosis  Discharge Planning: To Be Determined      Primary Diagnoses: Present on Admission: . CKD (chronic kidney disease) stage 4, GFR 15-29 ml/min (Darrell Mcclain) . Essential hypertension . Vascular dementia without behavioral disturbance ([place]) . Uncontrolled diabetes mellitus (Darrell Mcclain) . Pleural effusion . Acute hypoxemic respiratory failure due to COVID-19 ([place]) . Chronic diastolic CHF (congestive heart failure), NYHA class 2 ([place]) . Chronic respiratory failure with hypoxia (Darrell Mcclain)   I have reviewed the medical record, interviewed the patient and family, and examined the patient. The following aspects are pertinent.  Past Medical History:  Diagnosis Date  . Anticoagulant long-term use    xarelto  . Arthritis    right hand  . At risk for sleep apnea    STOP-BANG==  6      PT ALREADY SET UP TO HAVE STUDY AT [facility] , PCP AWARE  . Atrial flutter, chronic [facility]) dx 01/ 2015   cardiologist-  dr Angelena Form  . Bilateral lower extremity edema   . BPH (benign prostatic hyperplasia)   . Cerebral microvascular disease    advanced and multiple foci throughout hemispheres and brain stem-- residual memory issues  . Chronic constipation   . Chronic kidney disease (CKD), stage III (moderate)    nephrologist-  dr Marval Regal [facility])  . Diastolic CHF, chronic (Darrell Mcclain)   . Exertional dyspnea   . Foley catheter in place   . GERD (gastroesophageal reflux disease)   . Glaucoma, both eyes   . History of adenomatous polyp of colon    2012  . History of CVA (cerebrovascular accident)    (pt had no symptoms other than memory issues) per MRI 11-29-2015  very small infarct right frontal lobe and multiple foci throughout hemispheres and brain stem  . 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 Hyperlipidemia   .  Hypertension   . Iron deficiency anemia   . Memory loss   . Prostate cancer (Darrell Mcclain)   . RBBB (right bundle branch block with left anterior fascicular block)   . Rosacea   . Type 2 diabetes mellitus (Darrell Mcclain)   . Urine retention   . Vitamin B 12 deficiency    Social History   Socioeconomic History  . Marital status: Divorced    Spouse name: Not on file  . Number of children: Not on file  . Years of education: Not on file  . Highest education level: Not on file  Occupational History  . Not on file  Tobacco Use  . Smoking status: Former Smoker    Packs/day: 2.00    Years: 20.00    Pack years: 40.00    Quit date: 05/09/1983    Years since quitting: 36.4  . Smokeless tobacco: Never Used  Substance and Sexual Activity  . Alcohol use: No  . Drug use: No  . Sexual activity: Not Currently  Other Topics Concern  . Not on file  Social History Narrative   Lives at [place] through rehab after falling and breaking right shoulder. Phone: [phone] Fax: [phone]      Social Determinants of Health   Financial Resource Strain:   . Difficulty of Paying Living Expenses: Not on file  Food Insecurity:   . Worried About Charity fundraiser in the Last Year: Not on file  . Ran Out of Food in the Last Year: Not on file  Transportation Needs:   . Lack of Transportation (Medical): Not on file  . Lack of Transportation (Non-Medical): Not on file  Physical Activity:   . Days of Exercise per Week: Not on file  . Minutes of Exercise per Session: Not on file  Stress:   . Feeling of Stress : Not on file  Social Connections:   . Frequency of Communication with Friends and Family: Not on file  . Frequency of Social Gatherings with Friends and Family: Not on file  . Attends Religious Services: Not on file  . Active Member of Clubs or Organizations: Not on file  . Attends Archivist Meetings: Not on file  . Marital Status: Not on file   Family History  Problem  Relation Age of Onset  . Cancer Sister        lung to brain/smoker  . Colon cancer Neg Hx   . Rectal cancer Neg Hx   . Stomach cancer Neg Hx   . CAD Neg Hx   . Dementia Neg Hx    Scheduled Meds: . albuterol  2 puff Inhalation Q6H  . atorvastatin  20 mg Oral QPM  . donepezil  10 mg Oral QHS  . dorzolamide  1 drop Both Eyes BID  . feeding supplement (PRO-STAT SUGAR FREE 64)  30 mL Oral TID WC  . finasteride  5 mg Oral QPM  . insulin aspart  0-20 Units Subcutaneous TID WC  . insulin aspart  0-5 Units Subcutaneous QHS  . insulin aspart  4 Units Subcutaneous TID WC  . insulin glargine  13 Units Subcutaneous BID  . liver oil-zinc oxide   Topical QID  . methylPREDNISolone (SOLU-MEDROL) injection  50 mg Intravenous Q12H  . multivitamin with minerals  1 tablet Oral Daily  . nystatin cream   Topical BID  . pantoprazole  40 mg Oral Daily  . rivaroxaban  15 mg Oral Q supper  . sertraline  25 mg Oral Daily  . tamsulosin  0.4 mg Oral Q breakfast   Continuous Infusions: . remdesivir 100 mg in NS 100 mL 100 mg (10/05/19 0859)   PRN Meds:.acetaminophen, chlorpheniramine-HYDROcodone, guaiFENesin-dextromethorphan, HYDROcodone-acetaminophen, ondansetron **OR** ondansetron (ZOFRAN) IV Medications Prior to Admission:  Prior to Admission medications   Medication Sig Start Date End Date Taking? Authorizing Provider  acetaminophen (TYLENOL) 500 MG tablet Take 500 mg by mouth every 6 (six) hours as needed for mild pain, moderate pain, fever or headache.    Yes [provider]  Amino Acids-Protein Hydrolys (FEEDING SUPPLEMENT, PRO-STAT SUGAR FREE 64,) LIQD Take 30 mLs by mouth 3 (three) times daily with meals.   Yes [provider]  atorvastatin (LIPITOR) 20 MG tablet Take 1 tablet (20 mg total) by mouth daily. Patient taking differently: Take 20 mg by mouth daily. (0900) 09/22/13  Yes Burnell Blanks, MD  donepezil (ARICEPT) 10 MG tablet Take 1 tablet (10 mg total) by mouth at  bedtime. Patient taking differently: Take 10 mg  by mouth at bedtime. (2100) 11/09/17  Yes Melvenia Beam, MD  dorzolamide (TRUSOPT) 2 % ophthalmic solution Place 1 drop into both eyes 2 (two) times daily. (0900 & 2100) 06/20/15  Yes [provider]  finasteride (PROSCAR) 5 MG tablet Take 5 mg by mouth every evening. (2100) 05/16/16  Yes [provider]  HYDROcodone-acetaminophen (NORCO/VICODIN) 5-325 MG tablet Take 1 tablet by mouth every 4 (four) hours as needed for moderate pain. 09/22/19  Yes Dessa Phi, DO  insulin aspart (NOVOLOG) 100 UNIT/ML injection Inject 2-11 Units into the skin See admin instructions. Per Sliding Scale three times daily and at bedtime. 101-150 2 units 151-200 3 units 201-250 5 units 251-300 7 units 301-350 9 units > 350 11 units   Yes [provider]  insulin glargine (LANTUS) 100 UNIT/ML injection Inject 10 Units into the skin 2 (two) times daily. (0900 & 2100)   Yes [provider]  Melatonin 3 MG TABS Take 3 mg by mouth at bedtime. (2100)   Yes [provider]  Multiple Vitamin (MULTIVITAMIN WITH MINERALS) TABS tablet Take 1 tablet by mouth daily. (0900)   Yes [provider]  omeprazole (PRILOSEC) 20 MG capsule Take 20 mg by mouth daily. (0900) 04/26/11  Yes [provider]  potassium chloride SA (KLOR-CON) 20 MEQ tablet Take 20 mEq by mouth daily. (0900)   Yes [provider]  rivaroxaban (XARELTO) 20 MG TABS tablet Take 1 tablet (20 mg total) by mouth daily with supper. Patient taking differently: Take 20 mg by mouth daily.  07/23/16  Yes Festus Aloe, MD  sertraline (ZOLOFT) 25 MG tablet Take 25 mg by mouth daily. (0900)   Yes [provider]  tamsulosin (FLOMAX) 0.4 MG CAPS capsule Take 0.4 mg by mouth daily with breakfast. (0900) 01/09/14  Yes [provider]  Vitamin D, Ergocalciferol, (DRISDOL) 1.25 MG (50000 UT) CAPS capsule Take 50,000 Units by mouth every  Wednesday.   Yes [provider]  neomycin-bacitracin-polymyxin (NEOSPORIN) [id number] ointment Apply 1 application topically daily. Left lateral arm skin tear    [provider]   Allergies  Allergen Reactions  . Ciprofloxacin Diarrhea  . Neurontin [Gabapentin]     Hallucinations/altered mental status  . Oxycodone Other (See Comments)    Altered mental status   Review of Systems  Unable to perform ROS: Acuity of condition   Physical Exam  Vital Signs: BP 130/77   Pulse 97   Temp 97.8 F (36.6 C) (Oral)   Resp 20   Ht 5\' 7"  (1.702 m)   Wt 92.6 kg   SpO2 (!) 89%   BMI 31.97 kg/m  Pain Scale: 0-10   Pain Score: 0-No pain   SpO2: SpO2: (!) 89 % O2 Device:SpO2: (!) 89 % O2 Flow Rate: .O2 Flow Rate (L/min): 15 L/min  IO: Intake/output summary:   Intake/Output Summary (Last 24 hours) at 10/05/2019 0272 Last data filed at 10/05/2019 0600 Gross per 24 hour  Intake 1080 ml  Output 325 ml  Net 755 ml    LBM: Last BM Date: 10/04/19 Baseline Weight: Weight: 92.6 kg Most recent weight: Weight: 92.6 kg     Palliative Assessment/Data: PPS 20%     Time In: 1430 Time Out: 1510 Time Total: 40 Greater than 50%  of this time was spent counseling and coordinating care related to the above assessment and plan.  The above conversation was completed via telephone due to visitor restrictions during COVID-19 pandemic. Thorough chart review and  discussion with multidisciplinary team was completed as part of assessment. No physical examination was performed.   Signed by:  Ihor Dow, DNP, FNP-C Palliative Medicine Team  Phone: [phone] Fax: [phone]  Please contact Palliative Medicine Team phone at [phone] for questions and concerns.  For individual provider: See Shea Evans

## 2019-10-05 NOTE — Progress Notes (Addendum)
PROGRESS NOTE    Darrell Mcclain  LNL:[id number] DOB: 02-25-1941 DOA: 09/22/2019 PCP: Burnard Bunting, MD   Brief Narrative:  79 year old male with DM 2, vascular dementia, chronic diastolic CHF, CKD 4, paroxysmal Roark Rufo. 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   Assessment & Plan:   Principal Problem:   Acute hypoxemic respiratory failure due to COVID-19 [facility]) Active Problems:   Chronic diastolic CHF (congestive heart failure), NYHA class 2 (HCC)   Uncontrolled diabetes mellitus (HCC)   Essential hypertension   Pleural effusion   Chronic respiratory failure with hypoxia (HCC)   Vascular dementia without behavioral disturbance (HCC)   CKD (chronic kidney disease) stage 4, GFR 15-29 ml/min (HCC)   Pressure injury of skin  Acute Hypoxic Respiratory Failure due to Covid-19 Viral Illness -Patient started on remdesivir, scheduled to finish 5 days on 1/29 -Continue steroids -Given chronic loculated pleural effusions, concern for chronic osteomyelitis, very poor prognosis, not Keltie Labell candidate for Actemra -Profoundly hypoxic on 15 L this morning, NRB on top but he appears to be comfortable denying dyspnea. Happy hypoxic.  - elevated procal >1 on presentation - will resume cefepime and follow repeat procal, follow mrsa pcr, sputum cx - I/O, daily weights - trial of lasix today - IS, OOB, prone as able  COVID-19 Labs  Recent Labs    09/30/2019 0544 10/04/19 0302 10/05/19 0310  DDIMER 2.35* 2.67* 3.77*  CRP 11.7* 12.5* 7.7*    Lab Results  Component Value Date   SARSCOV2NAA NEGATIVE 09/21/2019   [place] NEGATIVE 09/10/2019   Acute Heart Failure Exacerbation  Diastolic Heart Failure:  - crackles on exam, appears overloaded with diffuse  edema - will trial high dose lasix x1 and follow response given renal function - I/O, daily weights - follow echo   Right Sided Pleural Effusion: - seen by pulmonology on 1/5, noted to be chronic - recommended maintaining euvolemia and that he may need serial drainage - may need to discuss with pulm again given worsening hypoxia - follow with diuresis  Acute Kidney Injury on Chronic kidney disease stage IV -baseline Creatinine ~ 1.2 - 1.7 -creatinine here has been consistently in the 2's, rising -given hypervolemia, will trial lasix as noted above -renal US from 1/9 without hydro, will repeat on this admission -follow UA and lytes  Essential hypertension -blood pressure stable  Chronic pleural effusion, loculated -Monitor, afebrile, WNC normal  Paroxysmal Azai Gaffin fib / flutter, currently in sinus -Chronic Xarelto  Vascular dementia -stable  DM2 -continue SSI, basal, bolus insulin with tradjenta  Pressure Ulcers and MASD: frequent turns, wound care c/s  Pressure Injury 09/10/19 Heel Right Deep Tissue Pressure Injury - Purple or maroon localized area of discolored intact skin or blood-filled blister due to damage of underlying soft tissue from pressure and/or shear. (Active)  09/10/19 1000  Location: Heel  Location Orientation: Right  Staging: Deep Tissue Pressure Injury - Purple or maroon localized area of discolored intact skin or blood-filled blister due to damage of underlying soft tissue from pressure and/or shear.  Wound Description (Comments):   Present on Admission:      Pressure Injury 09/10/2019 Sacrum Medial Stage 2 -  Partial thickness loss of dermis presenting as Myliyah Rebuck shallow open injury with Wilda Wetherell red, pink wound bed without slough. open  areas purple (Active)  09/23/2019 0930  Location: Sacrum  Location Orientation: Medial  Staging: Stage 2 -  Partial thickness loss of dermis presenting as Marna Weniger shallow open injury with Osher Oettinger red, pink wound bed without slough.  Wound Description  (Comments): open areas purple  Present on Admission: Yes   Goals of care -Previous provider discussed with the daughter Russ Halo [phone] over the phone, confirmed DNR, agreeable with medical management would avoid escalation of care, ICU, ventilator, if he is to decompensate and get worse we will focus on comfort care  Coag negative staph in 1/4 blood cx from 1/25, likely contaminant  DVT prophylaxis: xarelto Code Status: DNR Family Communication: none at bedside - daughter 1/27 Disposition Plan:  . Patient came from:SNF            . Anticipated d/c place: pending therapy eval, suspect will need SNF . Barriers to d/c OR conditions which need to be met to effect Sundi Slevin safe d/c: improvement in O2 requirement   Consultants:   Palliative care  Procedures:   none  Antimicrobials: Anti-infectives (From admission, onward)   Start     Dose/Rate Route Frequency Ordered Stop   10/04/19 1000  remdesivir 100 mg in sodium chloride 0.9 % 100 mL IVPB     100 mg 200 mL/hr over 30 Minutes Intravenous Daily 09/14/2019 0532 10/08/19 0959   09/09/2019 0600  remdesivir 200 mg in sodium chloride 0.9% 250 mL IVPB     200 mg 580 mL/hr over 30 Minutes Intravenous Once 09/16/2019 0532 10/04/19 0720   09/17/2019 0400  vancomycin (VANCOREADY) IVPB 2000 mg/400 mL     2,000 mg 200 mL/hr over 120 Minutes Intravenous  Once 09/20/2019 0319 09/24/2019 0809   09/14/2019 0330  ceFEPIme (MAXIPIME) 2 g in sodium chloride 0.9 % 100 mL IVPB     2 g 200 mL/hr over 30 Minutes Intravenous  Once 09/29/2019 0315 10/02/2019 0436     Subjective: C/o discomfort on bedpain Otherwise no other complaints  Objective: Vitals:   10/05/19 0354 10/05/19 0739 10/05/19 0955 10/05/19 1155  BP: 124/76 130/77 121/76 127/71  Pulse: (!) 104 97 90 94  Resp: 20 20 14 20   Temp:  97.8 F (36.6 C)  97.8 F (36.6 C)  TempSrc:  Oral  Axillary  SpO2: (!) 88% (!) 89% 91% 90%  Weight:      Height:        Intake/Output Summary (Last 24  hours) at 10/05/2019 1420 Last data filed at 10/05/2019 1230 Gross per 24 hour  Intake 755 ml  Output 725 ml  Net 30 ml   Filed Weights   10/08/2019 1626  Weight: 92.6 kg    Examination:  General exam: Appears calm and comfortable  Respiratory system: Clear to auscultation. Respiratory effort normal. Cardiovascular system: S1 & S2 heard, RRR. Gastrointestinal system: Abdomen is nondistended, soft and nontender. Central nervous system: Alert and oriented. No focal neurological deficits. Extremities: LE edema to thigh, L BKA Skin: unstageable sacral and R heel wounds Psychiatry: Judgement and insight appear normal. Mood & affect appropriate.     Data Reviewed: I have personally reviewed following labs and imaging studies  CBC: Recent Labs  Lab 10/06/2019 0345 10/04/19 0302 10/05/19 0310  WBC 8.6 8.1 10.7*  NEUTROABS 7.9*  --   --   HGB 11.1* 11.2* 11.3*  HCT 40.3 40.3 40.1  MCV 87.6 87.2 85.5  PLT 177 176 595   Basic Metabolic Panel: Recent Labs  Lab 10/09/2019 0345 10/04/19  0302 10/05/19 0310  NA 144 145 145  K 4.4 4.1 3.8  CL 105 104 105  CO2 28 28 28   GLUCOSE 161* 235* 266*  BUN 77* 91* 99*  CREATININE 2.54* 2.62* 2.74*  CALCIUM 8.0* 8.1* 8.1*  MG  --   --  2.2  PHOS  --   --  4.7*   GFR: Estimated Creatinine Clearance: 24.9 mL/min (Romanita Fager) (by C-G formula based on SCr of 2.74 mg/dL (H)). Liver Function Tests: Recent Labs  Lab 09/22/2019 0345 10/04/19 0302 10/05/19 0310  AST 46* 39 30  ALT 48* 43 40  ALKPHOS 162* 149* 152*  BILITOT 0.6 0.7 0.9  PROT 5.6* 5.6* 5.2*  ALBUMIN 2.3* 2.2* 2.1*   No results for input(s): LIPASE, AMYLASE in the last 168 hours. No results for input(s): AMMONIA in the last 168 hours. Coagulation Profile: Recent Labs  Lab 09/22/2019 0629  INR 1.5*   Cardiac Enzymes: No results for input(s): CKTOTAL, CKMB, CKMBINDEX, TROPONINI in the last 168 hours. BNP (last 3 results) No results for input(s): PROBNP in the last 8760  hours. HbA1C: No results for input(s): HGBA1C in the last 72 hours. CBG: Recent Labs  Lab 10/04/19 1707 10/04/19 1939 10/04/19 2157 10/05/19 0740 10/05/19 1129  GLUCAP 422* 465* 379* 200* 211*   Lipid Profile: No results for input(s): CHOL, HDL, LDLCALC, TRIG, CHOLHDL, LDLDIRECT in the last 72 hours. Thyroid Function Tests: No results for input(s): TSH, T4TOTAL, FREET4, T3FREE, THYROIDAB in the last 72 hours. Anemia Panel: No results for input(s): VITAMINB12, FOLATE, FERRITIN, TIBC, IRON, RETICCTPCT in the last 72 hours. Sepsis Labs: Recent Labs  Lab 09/11/2019 0227 09/27/2019 0544  PROCALCITON  --  1.07  LATICACIDVEN 1.0  1.1  --     Recent Results (from the past 240 hour(s))  Culture, blood (routine x 2)     Status: Abnormal   Collection Time: 09/20/2019  2:27 AM   Specimen: BLOOD  Result Value Ref Range Status   Specimen Description   Final    BLOOD Performed at [facility] [address] [place], [place] [address]    Special Requests   Final    BOTTLES DRAWN AEROBIC AND ANAEROBIC Blood Culture results may not be optimal due to an inadequate volume of blood received in culture bottles Performed at [facility] [address], [place], [place] [address]    Culture  Setup Time   Final    AEROBIC BOTTLE ONLY GRAM POSITIVE COCCI CRITICAL RESULT CALLED TO, READ BACK BY AND VERIFIED WITH: L SEAY PHARMD 10/04/19 0027 JDW    Culture (Madgeline Rayo)  Final    STAPHYLOCOCCUS SPECIES (COAGULASE NEGATIVE) THE SIGNIFICANCE OF ISOLATING THIS ORGANISM FROM Jaimey Franchini SINGLE SET OF BLOOD CULTURES WHEN MULTIPLE SETS ARE DRAWN IS UNCERTAIN. PLEASE NOTIFY THE MICROBIOLOGY DEPARTMENT WITHIN ONE WEEK IF SPECIATION AND SENSITIVITIES ARE REQUIRED. Performed at [facility], [place] [address], [place], [place] [address]    Report Status 10/04/2019 FINAL  Final  Culture, blood (routine x 2)     Status: None (Preliminary result)   Collection Time: 09/26/2019  2:27 AM    Specimen: BLOOD  Result Value Ref Range Status   Specimen Description   Final    BLOOD Performed at [facility] [address] [place], [place] [address]    Special Requests   Final    BOTTLES DRAWN AEROBIC AND ANAEROBIC Blood Culture results may not be optimal due to an inadequate volume  of blood received in culture bottles Performed at [facility], [place] [address], [place], [place] [address]    Culture   Final    NO GROWTH 2 DAYS Performed at [facility] [address], [place], [place] [address]    Report Status PENDING  Incomplete  Urine culture     Status: Abnormal   Collection Time: 09/11/2019  2:27 AM   Specimen: Urine, Clean Catch  Result Value Ref Range Status   Specimen Description   Final    URINE, CLEAN CATCH Performed at [facility], [place] [address], [place], [place] [address]    Special Requests   Final    NONE Performed at [facility], [place] [address], [place], [place] [address]    Culture ([place])  Final    <10,000 COLONIES/mL INSIGNIFICANT GROWTH Performed at [facility] [address], [place], [place] [address]    Report Status 10/04/2019 FINAL  Final         Radiology Studies: DG CHEST PORT 1 VIEW  Result Date: 09/24/2019 CLINICAL DATA:  Hypoxemia EXAM: PORTABLE CHEST 1 VIEW COMPARISON:  09/14/2019 FINDINGS: Cardiomegaly with vascular congestion. Bilateral interstitial prominence and airspace disease again noted, right greater than left. Right pleural effusion is stable. Improvement of the left pleural effusion. Old left rib fractures again noted, unchanged. IMPRESSION: Bilateral interstitial and airspace opacities, right greater than left, stable since prior study. Stable small right effusion.  Improved left effusion. Electronically Signed   By: Rolm Baptise M.D.   On: 10/09/2019 19:09        Scheduled Meds: . albuterol  2 puff Inhalation Q6H  . atorvastatin   20 mg Oral QPM  . donepezil  10 mg Oral QHS  . dorzolamide  1 drop Both Eyes BID  . feeding supplement (PRO-STAT SUGAR FREE 64)  30 mL Oral TID WC  . finasteride  5 mg Oral QPM  . insulin aspart  0-20 Units Subcutaneous TID WC  . insulin aspart  0-5 Units Subcutaneous QHS  . insulin aspart  4 Units Subcutaneous TID WC  . insulin glargine  13 Units Subcutaneous BID  . liver oil-zinc oxide   Topical QID  . methylPREDNISolone (SOLU-MEDROL) injection  50 mg Intravenous Q12H  . multivitamin with minerals  1 tablet Oral Daily  . nystatin cream   Topical BID  . pantoprazole  40 mg Oral Daily  . rivaroxaban  15 mg Oral Q supper  . sertraline  25 mg Oral Daily  . tamsulosin  0.4 mg Oral Q breakfast   Continuous Infusions: . furosemide 100 mg (10/05/19 1420)  . remdesivir 100 mg in NS 100 mL 100 mg (10/05/19 0859)     LOS: 2 days    Time spent: over 68 min    Fayrene Helper, MD [facility]   To contact the attending provider between 7A-7P or the covering provider during after hours 7P-7A, please log into the web site [email or web address] and access using universal [place] password for that web site. If you do not have the password, please call the hospital operator.  10/05/2019, 2:20 PM

## 2019-10-05 NOTE — Progress Notes (Addendum)
0745: Assumed care of Pt from night RN. Pt sleepy, pleasantly confused, denies pain, HR 90s Afib, BP 130/77, SpO2 88-90% on 15 NRB and 10L HFNC. Left BKA and right heel covered with kerlix, sacrum with foam dressing. Male external urine pouch in place. Repositioned, call bell within reach.   1230: Ok to straight cath Pt per MD for urine samples. 150 mL urine return. Refused lunch. After bathing, very red and inflamed, new bleeding noted to groin and testicles from MASD. MD notified. Echo tech at bedside for exam. Will monitor  1545: MD notified 11 beat run Vtach and more frequent ectopy. Pt C/o burning at foley site, order placed for lidocaine jelly. Pt a little more irritable/anxious... Pt maintaining sats now 80-82%, MD made aware and RT called for switch to heated HFNC. Xray tech at bedside. Call bell within reach  1800: Pt stated foley feels better after lidocaine. Refused dinner. Denies other needs at this time. Repositioned, call bell within reach

## 2019-10-05 NOTE — Progress Notes (Signed)
Notified MD of pain in scrotum and requiring more pain meds. Orders received.

## 2019-10-05 NOTE — Progress Notes (Signed)
Pharmacy Antibiotic Note  Darrell Mcclain is a 79 y.o. male admitted on 10/06/2019 with COVID-19.  Pharmacy has been consulted for cefepime dosing for HAP.  Plan: Cefepime 2g IV q24h with CrCl 11-29 ml/min Follow up renal function & cultures  Height: 5\' 7"  (170.2 cm) Weight: 220 lb 7.4 oz (100 kg) IBW/kg (Calculated) : 66.1  Temp (24hrs), Avg:97.7 F (36.5 C), Min:97.4 F (36.3 C), Max:98 F (36.7 C)  Recent Labs  Lab 09/22/2019 0227 09/14/2019 0345 10/04/19 0302 10/05/19 0310  WBC  --  8.6 8.1 10.7*  CREATININE  --  2.54* 2.62* 2.74*  LATICACIDVEN 1.0  1.1  --   --   --     Estimated Creatinine Clearance: 25.9 mL/min (A) (by C-G formula based on SCr of 2.74 mg/dL (H)).    Allergies  Allergen Reactions  . Ciprofloxacin Diarrhea  . Neurontin [Gabapentin]     Hallucinations/altered mental status  . Oxycodone Other (See Comments)    Altered mental status    Antimicrobials this admission: Vanc x1 1/25 Cefepime x1 1/25, resume 1/27 >>  Remdesivir 1/25 >> 1/29  Dose adjustments this admission:  Microbiology results: 1/25 UCx - negative 1/25 BCx - 1/4 CoNS 1/27 MRSA PCR: 1/27 Sputum:  Thank you for allowing pharmacy to be a part of this patient's care.  Peggyann Juba, PharmD, BCPS Pharmacy: [phone] 10/05/2019 6:56 PM

## 2019-10-05 NOTE — TOC Initial Note (Signed)
Transition of Care [facility]) - Initial/Assessment Note    Patient Details  Name: Darrell Mcclain MRN: [id number] Date of Birth: September 11, 1940  [facility]) CM/SW Contact:    Amador Cunas, [place] Phone Number: 10/05/2019, 4:29 PM  Clinical Narrative:     Pt re-admit from [facility]. Spoke to [place] in admissions who confirmed pt is a LTC resident and able to return at d/c. SW will follow.   Wandra Feinstein, MSW, LCSW [phone] (GV coverage)                 Expected Discharge Plan: Skilled Nursing Facility Barriers to Discharge: Continued Medical Work up   Patient Goals and CMS Choice        Expected Discharge Plan and Services Expected Discharge Plan: [place] Choice: [place] Living arrangements for the past 2 months: [place]                                      Prior Living Arrangements/Services Living arrangements for the past 2 months: [place]                     Activities of Daily Living Home Assistive Devices/Equipment: None ADL Screening (condition at time of admission) Patient's cognitive ability adequate to safely complete daily activities?: No Is the patient deaf or have difficulty hearing?: Yes Does the patient have difficulty seeing, even when wearing glasses/contacts?: No Does the patient have difficulty concentrating, remembering, or making decisions?: Yes Patient able to express need for assistance with ADLs?: Yes Does the patient have difficulty dressing or bathing?: Yes Independently performs ADLs?: No Communication: Independent Dressing (OT): Dependent Is this a change from baseline?: Pre-admission baseline Grooming: Dependent Is this a change from baseline?: Pre-admission baseline Feeding: Dependent Is this a change from baseline?: Pre-admission baseline Bathing: Dependent Is this a change from baseline?: Pre-admission  baseline Toileting: Dependent Is this a change from baseline?: Pre-admission baseline In/Out Bed: Dependent Is this a change from baseline?: Pre-admission baseline Walks in Home: Dependent Is this a change from baseline?: Pre-admission baseline Does the patient have difficulty walking or climbing stairs?: Yes Weakness of Legs: Right Weakness of Arms/Hands: None  Permission Sought/Granted         Permission granted to share info w AGENCY: [facility]        Emotional Assessment              Admission diagnosis:  Acute respiratory failure with hypoxia ([place]) [J96.01] Acute hypoxemic respiratory failure due to COVID-19 ([place]) [U07.1, J96.01] COVID-19 [U07.1] Patient Active Problem List   Diagnosis Date Noted  . Pressure injury of skin 10/04/2019  . CKD (chronic kidney disease) stage 4, GFR 15-29 ml/min (HCC) 09/27/2019  . COVID-19 09/16/2019  . Acute renal failure ([place])   . Encephalopathy acute   . Hypercarbia   . Hyperkalemia   . Acute lower UTI   . Pleural effusion   . Chronic respiratory failure with hypoxia ([place])   . Vascular dementia without behavioral disturbance ([place])   . Hypokalemia   . Goals of care, counseling/discussion   . Palliative care by specialist   . Osteomyelitis ([place]) 09/10/2019  . Diabetic foot infection ([place])   . Moderate protein-calorie malnutrition ([place])   . Malignant neoplasm of prostate ([place]) 09/10/2016  . Falls 11/21/2015  . Memory loss  11/21/2015  . Uncontrolled diabetes mellitus ([place]) 11/21/2015  . Morbid obesity ([place]) 11/21/2015  . Hyperlipidemia 11/21/2015  . Essential hypertension 11/21/2015  . 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 Chronic diastolic CHF (congestive heart failure), NYHA class 2 ([place]) 02/28/2014  . Atrial flutter ([place]) 10/13/2013   PCP:  Burnard Bunting, MD Pharmacy:   [place], [place] - [address] [place] [address] Phone: [phone] Fax:  [phone]     Social Determinants of Health (SDOH) Interventions    Readmission Risk Interventions No flowsheet data found.

## 2019-10-06 ENCOUNTER — Inpatient Hospital Stay (HOSPITAL_COMMUNITY): Payer: Medicare Other

## 2019-10-06 ENCOUNTER — Encounter (HOSPITAL_COMMUNITY): Payer: Self-pay | Admitting: Internal Medicine

## 2019-10-06 DIAGNOSIS — R7989 Other specified abnormal findings of blood chemistry: Secondary | ICD-10-CM

## 2019-10-06 DIAGNOSIS — J9601 Acute respiratory failure with hypoxia: Secondary | ICD-10-CM

## 2019-10-06 LAB — COMPREHENSIVE METABOLIC PANEL
ALT: 33 U/L (ref 0–44)
AST: 26 U/L (ref 15–41)
Albumin: 2.2 g/dL — ABNORMAL LOW (ref 3.5–5.0)
Alkaline Phosphatase: 153 U/L — ABNORMAL HIGH (ref 38–126)
Anion gap: 12 (ref 5–15)
BUN: 106 mg/dL — ABNORMAL HIGH (ref 8–23)
CO2: 28 mmol/L (ref 22–32)
Calcium: 8.2 mg/dL — ABNORMAL LOW (ref 8.9–10.3)
Chloride: 107 mmol/L (ref 98–111)
Creatinine, Ser: 2.84 mg/dL — ABNORMAL HIGH (ref 0.61–1.24)
GFR calc Af Amer: 24 mL/min — ABNORMAL LOW (ref >60)
GFR calc non Af Amer: 21 mL/min — ABNORMAL LOW (ref >60)
Glucose, Bld: 77 mg/dL (ref 70–99)
Potassium: 4.5 mmol/L (ref 3.5–5.1)
Sodium: 147 mmol/L — ABNORMAL HIGH (ref 135–145)
Total Bilirubin: 0.9 mg/dL (ref 0.3–1.2)
Total Protein: 5.5 g/dL — ABNORMAL LOW (ref 6.5–8.1)

## 2019-10-06 LAB — PROCALCITONIN: Procalcitonin: 0.63 ng/mL

## 2019-10-06 LAB — CBC WITH DIFFERENTIAL/PLATELET
Abs Immature Granulocytes: 0.08 10*3/uL — ABNORMAL HIGH (ref 0.00–0.07)
Basophils Absolute: 0 10*3/uL (ref 0.0–0.1)
Basophils Relative: 0 %
Eosinophils Absolute: 0 10*3/uL (ref 0.0–0.5)
Eosinophils Relative: 0 %
HCT: 43.5 % (ref 39.0–52.0)
Hemoglobin: 12.1 g/dL — ABNORMAL LOW (ref 13.0–17.0)
Immature Granulocytes: 1 %
Lymphocytes Relative: 1 %
Lymphs Abs: 0.2 10*3/uL — ABNORMAL LOW (ref 0.7–4.0)
MCH: 23.9 pg — ABNORMAL LOW (ref 26.0–34.0)
MCHC: 27.8 g/dL — ABNORMAL LOW (ref 30.0–36.0)
MCV: 86 fL (ref 80.0–100.0)
Monocytes Absolute: 0.3 10*3/uL (ref 0.1–1.0)
Monocytes Relative: 2 %
Neutro Abs: 11.6 10*3/uL — ABNORMAL HIGH (ref 1.7–7.7)
Neutrophils Relative %: 96 %
Platelets: 161 10*3/uL (ref 150–400)
RBC: 5.06 MIL/uL (ref 4.22–5.81)
RDW: 18.7 % — ABNORMAL HIGH (ref 11.5–15.5)
WBC: 12.1 10*3/uL — ABNORMAL HIGH (ref 4.0–10.5)
nRBC: 0 % (ref 0.0–0.2)

## 2019-10-06 LAB — D-DIMER, QUANTITATIVE: D-Dimer, Quant: 9.32 ug/mL-FEU — ABNORMAL HIGH (ref 0.00–0.50)

## 2019-10-06 LAB — UREA NITROGEN, URINE: Urea Nitrogen, Ur: 851 mg/dL

## 2019-10-06 LAB — GLUCOSE, CAPILLARY
Glucose-Capillary: 54 mg/dL — ABNORMAL LOW (ref 70–99)
Glucose-Capillary: 94 mg/dL (ref 70–99)
Glucose-Capillary: 105 mg/dL — ABNORMAL HIGH (ref 70–99)
Glucose-Capillary: 90 mg/dL (ref 70–99)
Glucose-Capillary: 172 mg/dL — ABNORMAL HIGH (ref 70–99)

## 2019-10-06 LAB — C-REACTIVE PROTEIN: CRP: 7.1 mg/dL — ABNORMAL HIGH (ref <1.0)

## 2019-10-06 LAB — MAGNESIUM: Magnesium: 2.4 mg/dL (ref 1.7–2.4)

## 2019-10-06 LAB — FERRITIN: Ferritin: 412 ng/mL — ABNORMAL HIGH (ref 24–336)

## 2019-10-06 LAB — PHOSPHORUS: Phosphorus: 4.7 mg/dL — ABNORMAL HIGH (ref 2.5–4.6)

## 2019-10-06 MED ORDER — FUROSEMIDE 10 MG/ML IJ SOLN
160.0000 mg | Freq: Once | INTRAVENOUS | Status: DC
  Filled 2019-10-06: qty 16

## 2019-10-06 MED ORDER — INSULIN ASPART 100 UNIT/ML ~~LOC~~ SOLN
0.0000 [IU] | SUBCUTANEOUS | Status: DC
  Administered 2019-10-07: 3 [IU] via SUBCUTANEOUS

## 2019-10-06 MED ORDER — FUROSEMIDE 10 MG/ML IJ SOLN
160.0000 mg | Freq: Three times a day (TID) | INTRAVENOUS | Status: DC
  Administered 2019-10-06 – 2019-10-07 (×4): 160 mg via INTRAVENOUS
  Filled 2019-10-06 (×2): qty 16
  Filled 2019-10-06: qty 10
  Filled 2019-10-06 (×2): qty 16

## 2019-10-06 MED ORDER — DEXTROSE 50 % IV SOLN
INTRAVENOUS | Status: AC
  Administered 2019-10-06: 50 mL
  Filled 2019-10-06: qty 50

## 2019-10-06 MED ORDER — ENOXAPARIN SODIUM 100 MG/ML ~~LOC~~ SOLN
100.0000 mg | SUBCUTANEOUS | Status: DC
  Administered 2019-10-06: 100 mg via SUBCUTANEOUS
  Filled 2019-10-06 (×2): qty 1

## 2019-10-06 MED ORDER — INSULIN GLARGINE 100 UNIT/ML ~~LOC~~ SOLN
7.0000 [IU] | Freq: Two times a day (BID) | SUBCUTANEOUS | Status: DC
  Filled 2019-10-06: qty 0.07

## 2019-10-06 MED ORDER — HEPARIN (PORCINE) 25000 UT/250ML-% IV SOLN: 1100.0000 [IU]/h | INTRAVENOUS | Status: DC

## 2019-10-06 NOTE — Progress Notes (Signed)
Daily Progress Note   Patient Name: Darrell Mcclain       Date: 10/06/2019 DOB: 1941/06/24  Age: 79 y.o. MRN#: [id number] Attending Physician: Elodia Florence., * Primary Care Physician: Burnard Bunting, MD Admit Date: 10/01/2019  Reason for Consultation/Follow-up: Establishing goals of care  Subjective/GOC: Per Dr. [name], patient with worsening clinical status today with increased oxygen requirement and fluid overload. Plan is to give high dose lasix and watchful waiting.   Spoke with daughter, Darrell Mcclain to provide update on clinical condition and plan of care. Follow up from conversation yesterday. Darrell Mcclain is very realistic about her father's condition and asks many questions about comfort measures. Educated on comfort focused care including medications to ensure relief from pain and suffering. Prepared her that with high oxygen requirements, he would likely pass away in the hospital. Answered questions and concerns. Support provided.   Length of Stay: 3  Current Medications: Scheduled Meds:  . albuterol  2 puff Inhalation Q6H  . atorvastatin  20 mg Oral QPM  . Chlorhexidine Gluconate Cloth  6 each Topical Daily  . donepezil  10 mg Oral QHS  . dorzolamide  1 drop Both Eyes BID  . enoxaparin (LOVENOX) injection  100 mg Subcutaneous Q24H  . feeding supplement (PRO-STAT SUGAR FREE 64)  30 mL Oral TID WC  . finasteride  5 mg Oral QPM  . insulin aspart  0-15 Units Subcutaneous Q4H  . liver oil-zinc oxide   Topical QID  . methylPREDNISolone (SOLU-MEDROL) injection  50 mg Intravenous Q12H  . multivitamin with minerals  1 tablet Oral Daily  . nystatin cream   Topical BID  . pantoprazole  40 mg Oral Daily  . sertraline  25 mg Oral Daily  . tamsulosin  0.4 mg Oral Q breakfast     Continuous Infusions: . ceFEPime (MAXIPIME) IV 2 g (10/05/19 2043)  . furosemide 160 mg (10/06/19 1133)  . remdesivir 100 mg in NS 100 mL 100 mg (10/06/19 1333)    PRN Meds: acetaminophen, chlorpheniramine-HYDROcodone, fentaNYL (SUBLIMAZE) injection, guaiFENesin-dextromethorphan, HYDROcodone-acetaminophen, ondansetron **OR** ondansetron (ZOFRAN) IV  Physical Exam          Vital Signs: BP 122/62   Pulse 97   Temp (!) 97.4 F (36.3 C) (Axillary) Comment: Md aware  Resp 17   Ht 5\' 7"  (1.702 m)  Wt 100.2 kg   SpO2 (!) 85%   BMI 34.60 kg/m  SpO2: SpO2: (!) 85 % O2 Device: O2 Device: High Flow Nasal Cannula, NRB O2 Flow Rate: O2 Flow Rate (L/min): 40 L/min(40L HFNC/15L NRB)  Intake/output summary:   Intake/Output Summary (Last 24 hours) at 10/06/2019 1601 Last data filed at 10/06/2019 0500 Gross per 24 hour  Intake 320 ml  Output 695 ml  Net -375 ml   LBM: Last BM Date: 10/05/19 Baseline Weight: Weight: 92.6 kg Most recent weight: Weight: 100.2 kg       Palliative Assessment/Data: PPS 10%      Patient Active Problem List   Diagnosis Date Noted  . Pressure injury of skin 10/04/2019  . CKD (chronic kidney disease) stage 4, GFR 15-29 ml/min (HCC) 10/09/2019  . COVID-19 09/28/2019  . Acute renal failure ([place])   . Encephalopathy acute   . Hypercarbia   . Hyperkalemia   . Acute lower UTI   . Pleural effusion   . Chronic respiratory failure with hypoxia ([place])   . Vascular dementia without behavioral disturbance ([place])   . Hypokalemia   . Goals of care, counseling/discussion   . Palliative care by specialist   . Osteomyelitis ([place]) 09/10/2019  . Diabetic foot infection ([place])   . Moderate protein-calorie malnutrition ([place])   . Malignant neoplasm of prostate ([place]) 09/10/2016  . Falls 11/21/2015  . Memory loss 11/21/2015  . Uncontrolled diabetes mellitus ([place]) 11/21/2015  . Morbid obesity ([place]) 11/21/2015  . Hyperlipidemia 11/21/2015  . Essential hypertension  11/21/2015  . 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 Chronic diastolic CHF (congestive heart failure), NYHA class 2 ([place]) 02/28/2014  . Atrial flutter ([place]) 10/13/2013    Palliative Care Assessment & Plan   Patient Profile: 78 y.o. male  with past medical history of vascular dementia, DM type 2, chronic diastolic CHF, CKD stage 4, paroxysmal afib on xarelto, hypoxic respiratory failure on 2L [place], chronic loculated pleural effusions, and recent admission for osteomyelitis of foot s/p BKA and progression of CKD discharged to SNF admitted on 09/29/2019 with hypoxia. Found to have COVID-19 requiring 15L HFNC. Dr. Cruzita Lederer spoke with daughter confirming DNR code status, agreeable with medical management but no escalation of care ICU/vent if decompensation. Palliative medicine consultation for goals of care/hospice eligibility.   Assessment: Acute hypoxic respiratory failure COVID-19 viral illness Acute heart failure exacerbation Diastolic heart failure Right sided pleural effusion Chronic loculated pleural effusions  AKI on CKD stage IV Vascular dementia  Recommendations/Plan:  DNR/DNI, no escalation of care  Continue current plan of care and medical management per attending. Clinical decline today with increasing oxygen requirements and fluid overload. Discussed with Dr. [name].   Updated daughter via telephone who is very realistic with poor prognosis. Answered many questions about comfort focused care. Likely transition to comfort focused care if no improvement and/or worsening clinical status in the next 24-48 hours.   PMT provider to follow up with Dr. [name] and daughter tomorrow.   Code Status: DNR/DNI   Code Status Orders  (From admission, onward)         Start     Ordered   09/27/2019 0551  Do not attempt resuscitation (DNR)  Continuous    Question Answer Comment  In the event of cardiac or respiratory ARREST Do not call a "code blue"   In the event of cardiac or  respiratory ARREST Do not perform Intubation, CPR, defibrillation or ACLS   In the event of cardiac  or respiratory ARREST Use medication by any route, position, wound care, and other measures to relive pain and suffering. May use oxygen, suction and manual treatment of airway obstruction as needed for comfort.   Comments Confirmed with daughter      09/13/2019 0555        Code Status History    Date Active Date Inactive Code Status Order ID Comments User Context   09/11/2019 1031 09/23/2019 0203 DNR [id number]  Aline August, MD Inpatient   09/10/2019 0922 09/11/2019 1031 Full Code [id number]  Karmen Bongo, MD ED   Advance Care Planning Activity       Prognosis:   Poor prognosis  Discharge Planning:  To Be Determined  Care plan was discussed with RN, Dr. [name], daughter Darrell Mcclain)  Thank you for allowing the Palliative Medicine Team to assist in the care of this patient.  The above conversation was completed via telephone due to visitor restrictions during COVID-19 pandemic. Thorough chart review and discussion with multidisciplinary team was completed as part of assessment. No physical examination was performed.    Time In: 1540- Time Out: 1600 Total Time 20 Prolonged Time Billed no      Greater than 50%  of this time was spent counseling and coordinating care related to the above assessment and plan.  Ihor Dow, DNP, FNP-C Palliative Medicine Team  Phone: [phone] Fax: [phone]  Please contact Palliative Medicine Team phone at [phone] for questions and concerns.

## 2019-10-06 NOTE — Progress Notes (Signed)
Lower extremity venous has been completed.   Preliminary results in CV Proc.   Abram Sander 10/06/2019 2:05 PM

## 2019-10-06 NOTE — Progress Notes (Signed)
Spoke with both of the patient's daughters today. They both expressed wishes for their father to be kept comfortable.  Patient was able to facetime with Darrell Mcclain, who will speak to the family and see if anyone wishes to visit. Family is aware of visitation policy.

## 2019-10-06 NOTE — Progress Notes (Addendum)
PROGRESS NOTE    Darrell Mcclain  TIW:[id number] DOB: 07-09-1941 DOA: 10/01/2019 PCP: Burnard Bunting, MD   Brief Narrative:  79 year old male with DM 2, vascular dementia, chronic diastolic CHF, CKD 4, paroxysmal Darrell Mcclain. 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   Assessment & Plan:   Principal Problem:   COVID-19 Active Problems:   Chronic diastolic CHF (congestive heart failure), NYHA class 2 (HCC)   Uncontrolled diabetes mellitus ([place])   Essential hypertension   Palliative care by specialist   Pleural effusion   Chronic respiratory failure with hypoxia (HCC)   Vascular dementia without behavioral disturbance (HCC)   CKD (chronic kidney disease) stage 4, GFR 15-29 ml/min (HCC)   Pressure injury of skin  Acute Hypoxic Respiratory Failure due to Covid-19 Viral Illness - worsening hypoxia, he's currently requiring 40 Darrell HFNC - Patient started on remdesivir, scheduled to finish 5 days on 1/29 -Continue steroids -Given chronic loculated pleural effusions, concern for chronic osteomyelitis, very poor prognosis, not Ellina Sivertsen candidate for Actemra -Profoundly hypoxic on 15 Darrell this morning, NRB on top but he appears to be comfortable denying dyspnea. Happy hypoxic.  - elevated procal >1 on presentation - will resume cefepime and follow repeat procal, follow mrsa pcr, sputum cx - I/O, daily weights - appears volume overloaded (see below) continue high dose lasix, appreciate renal assistance - IS, OOB, prone as able - LE [place] pending - repeat CXR 1/29  COVID-19 Labs  Recent Labs    10/04/19 0302 10/05/19 0310 10/06/19 0440  DDIMER 2.67* 3.77* 9.32*  FERRITIN  --   --  412*  CRP 12.5* 7.7* 7.1*    Lab Results  Component Value Date   SARSCOV2NAA  NEGATIVE 09/21/2019   [place] NEGATIVE 09/10/2019   Acute Heart Failure Exacerbation  Diastolic Heart Failure:  - crackles on exam, appears overloaded with diffuse edema - continue with high dose lasix - appreciate renal assistance - I/O, daily weights - follow echo (dilated IVC with elevated RVSP, grade 1 diastolic dysfunction)  Right Sided Pleural Effusion: - seen by pulmonology on 1/5, noted to be chronic - recommended maintaining euvolemia and that he may need serial drainage - may need to discuss with pulm again given worsening hypoxia - follow with diuresis  Acute Kidney Injury on Chronic kidney disease stage IV -baseline Creatinine ~ 1.2 - 1.7 -creatinine here has been consistently in the 2's, rising again today after diuresis -given hypervolemia, will continue lasix -renal US from 1/9 without hydro, again without hydro 1/28 -follow UA and lytes - low renal sodium, prerenal, which supports heart failure as etiology -renal c/s, appreciate recs  Essential hypertension -blood pressure stable  Chronic pleural effusion, loculated -Monitor, afebrile, WNC normal  Paroxysmal [place] Craddock fib / flutter, currently in sinus -Chronic Xarelto  Vascular dementia -stable  DM2  Hypoglycemia - change to q4 SSI, hold mealtime and basal insulin for now until pt PO intake improves  Pressure Ulcers and MASD: frequent turns, wound care c/s  Pressure Injury 09/10/19 Heel Right Deep Tissue Pressure Injury - Purple or maroon localized area of discolored intact skin or blood-filled blister due to damage of underlying soft tissue from pressure and/or shear. (Active)  09/10/19 1000  Location: Heel  Location Orientation: Right  Staging: Deep Tissue Pressure Injury -  Purple or maroon localized area of discolored intact skin or blood-filled blister due to damage of underlying soft tissue from pressure and/or shear.  Wound Description (Comments):   Present on Admission:      Pressure Injury  09/26/2019 Sacrum Medial Stage 2 -  Partial thickness loss of dermis presenting as Anarie Kalish shallow open injury with Eletha Culbertson red, pink wound bed without slough. open areas purple (Active)  10/02/2019 0930  Location: Sacrum  Location Orientation: Medial  Staging: Stage 2 -  Partial thickness loss of dermis presenting as Alivia Cimino shallow open injury with Sharlisa Hollifield red, pink wound bed without slough.  Wound Description (Comments): open areas purple  Present on Admission: Yes   Goals of care -Previous provider discussed with the daughter Darrell Mcclain [phone] over the phone, confirmed DNR, agreeable with medical management would avoid escalation of care, ICU, ventilator, if he is to decompensate and get worse we will focus on comfort care  Coag negative staph in 1/4 blood cx from 1/25, likely contaminant  DVT prophylaxis: xarelto Code Status: DNR Family Communication: none at bedside - daughter 1/28 Disposition Plan:  . Patient came from:SNF            . Anticipated d/c place: pending therapy eval, suspect will need SNF . Barriers to d/c OR conditions which need to be met to effect Darrell Mcclain safe d/c: improvement in O2 requirement   Consultants:   Palliative care  Procedures:   none  Antimicrobials: Anti-infectives (From admission, onward)   Start     Dose/Rate Route Frequency Ordered Stop   10/05/19 1930  ceFEPIme (MAXIPIME) 2 g in sodium chloride 0.9 % 100 mL IVPB     2 g 200 mL/hr over 30 Minutes Intravenous Every 24 hours 10/05/19 1855     10/04/19 1000  remdesivir 100 mg in sodium chloride 0.9 % 100 mL IVPB     100 mg 200 mL/hr over 30 Minutes Intravenous Daily 10/05/2019 0532 10/08/19 0959   09/24/2019 0600  remdesivir 200 mg in sodium chloride 0.9% 250 mL IVPB     200 mg 580 mL/hr over 30 Minutes Intravenous Once 10/05/2019 0532 10/04/19 0720   09/12/2019 0400  vancomycin (VANCOREADY) IVPB 2000 mg/400 mL     2,000 mg 200 mL/hr over 120 Minutes Intravenous  Once 09/13/2019 0319 09/23/2019 0809   09/16/2019 0330   ceFEPIme (MAXIPIME) 2 g in sodium chloride 0.9 % 100 mL IVPB     2 g 200 mL/hr over 30 Minutes Intravenous  Once 09/30/2019 0315 09/23/2019 0436     Subjective: Denies SOB Notes decreased appetite  Objective: Vitals:   10/06/19 0500 10/06/19 0752 10/06/19 0800 10/06/19 1202  BP:  116/62  122/62  Pulse:  94  97  Resp:  14  17  Temp:   (!) 97.4 F (36.3 C)   TempSrc:   Axillary   SpO2:  (!) 89%  (!) 85%  Weight: 100.2 kg     Height:        Intake/Output Summary (Last 24 hours) at 10/06/2019 1335 Last data filed at 10/06/2019 0500 Gross per 24 hour  Intake 490 ml  Output 820 ml  Net -330 ml   Filed Weights   09/25/2019 1626 10/05/19 1854 10/06/19 0500  Weight: 92.6 kg 100 kg 100.2 kg    Examination:  General: No acute distress. Cardiovascular: Heart sounds show Darrell Mcclain regular rate, and rhythm.  Lungs: Clear to auscultation bilaterally  Abdomen: Soft, nontender, nondistended  Neurological: Alert and oriented 3. Moves all  extremities 4. Cranial nerves II through XII grossly intact. Skin: Warm and dry. No rashes or lesions. Extremities: Darrell BKA, edema to thighs    Data Reviewed: I have personally reviewed following labs and imaging studies  CBC: Recent Labs  Lab 09/12/2019 0345 10/04/19 0302 10/05/19 0310 10/06/19 0440  WBC 8.6 8.1 10.7* 12.1*  NEUTROABS 7.9*  --   --  11.6*  HGB 11.1* 11.2* 11.3* 12.1*  HCT 40.3 40.3 40.1 43.5  MCV 87.6 87.2 85.5 86.0  PLT 177 176 158 297   Basic Metabolic Panel: Recent Labs  Lab 09/28/2019 0345 10/04/19 0302 10/05/19 0310 10/06/19 0440  NA 144 145 145 147*  K 4.4 4.1 3.8 4.5  CL 105 104 105 107  CO2 28 28 28 28   GLUCOSE 161* 235* 266* 77  BUN 77* 91* 99* 106*  CREATININE 2.54* 2.62* 2.74* 2.84*  CALCIUM 8.0* 8.1* 8.1* 8.2*  MG  --   --  2.2 2.4  PHOS  --   --  4.7* 4.7*   GFR: Estimated Creatinine Clearance: 24.9 mL/min (Darrell Mcclain) (by C-G formula based on SCr of 2.84 mg/dL (H)). Liver Function Tests: Recent Labs  Lab  10/01/2019 0345 10/04/19 0302 10/05/19 0310 10/06/19 0440  AST 46* 39 30 26  ALT 48* 43 40 33  ALKPHOS 162* 149* 152* 153*  BILITOT 0.6 0.7 0.9 0.9  PROT 5.6* 5.6* 5.2* 5.5*  ALBUMIN 2.3* 2.2* 2.1* 2.2*   No results for input(s): LIPASE, AMYLASE in the last 168 hours. No results for input(s): AMMONIA in the last 168 hours. Coagulation Profile: Recent Labs  Lab 09/29/2019 0629  INR 1.5*   Cardiac Enzymes: No results for input(s): CKTOTAL, CKMB, CKMBINDEX, TROPONINI in the last 168 hours. BNP (last 3 results) No results for input(s): PROBNP in the last 8760 hours. HbA1C: No results for input(s): HGBA1C in the last 72 hours. CBG: Recent Labs  Lab 10/05/19 1627 10/05/19 2058 10/06/19 0735 10/06/19 0829 10/06/19 1138  GLUCAP 120* 102* 54* 94 105*   Lipid Profile: No results for input(s): CHOL, HDL, LDLCALC, TRIG, CHOLHDL, LDLDIRECT in the last 72 hours. Thyroid Function Tests: No results for input(s): TSH, T4TOTAL, FREET4, T3FREE, THYROIDAB in the last 72 hours. Anemia Panel: Recent Labs    10/06/19 0440  FERRITIN 412*   Sepsis Labs: Recent Labs  Lab 09/21/2019 0227 09/29/2019 0544 10/05/19 2000 10/06/19 0440  PROCALCITON  --  1.07 0.73 0.63  LATICACIDVEN 1.0  1.1  --   --   --     Recent Results (from the past 240 hour(s))  Culture, blood (routine x 2)     Status: Abnormal   Collection Time: 09/16/2019  2:27 AM   Specimen: BLOOD  Result Value Ref Range Status   Specimen Description   Final    BLOOD Performed at [facility] [address] [place] [address], [place], [place] [address]    Special Requests   Final    BOTTLES DRAWN AEROBIC AND ANAEROBIC Blood Culture results may not be optimal due to an inadequate volume of blood received in culture bottles Performed at [facility] [address], [place],  [address]    Culture  Setup Time   Final    AEROBIC BOTTLE ONLY GRAM POSITIVE COCCI CRITICAL RESULT CALLED TO, READ BACK BY AND  VERIFIED WITH: Darrell Mcclain 10/04/19 0027 JDW    Culture ([place])  Final    STAPHYLOCOCCUS SPECIES (COAGULASE NEGATIVE) THE SIGNIFICANCE OF ISOLATING THIS ORGANISM FROM Darrell Mcclain  SINGLE SET OF BLOOD CULTURES WHEN MULTIPLE SETS ARE DRAWN IS UNCERTAIN. PLEASE NOTIFY THE MICROBIOLOGY DEPARTMENT WITHIN ONE WEEK IF SPECIATION AND SENSITIVITIES ARE REQUIRED. Performed at [facility], [place] [address], [place], [place] [address]    Report Status 10/04/2019 FINAL  Final  Culture, blood (routine x 2)     Status: None (Preliminary result)   Collection Time: 09/12/2019  2:27 AM   Specimen: BLOOD  Result Value Ref Range Status   Specimen Description   Final    BLOOD SITE NOT SPECIFIED Performed at [facility] [address] [place], [place] [address]    Special Requests   Final    BOTTLES DRAWN AEROBIC AND ANAEROBIC Blood Culture results may not be optimal due to an inadequate volume of blood received in culture bottles Performed at [facility] [address] [place], [place] [address]    Culture   Final    NO GROWTH 3 DAYS Performed at [facility], [place] [address], [place], [place] [address]    Report Status PENDING  Incomplete  Urine culture     Status: Abnormal   Collection Time: 10/01/2019  2:27 AM   Specimen: Urine, Clean Catch  Result Value Ref Range Status   Specimen Description   Final    URINE, CLEAN CATCH Performed at [facility], [place] [address], [place], [place] [address]    Special Requests   Final    NONE Performed at [facility], [place] [address], [place], [place] [address]    Culture (Darrell Mcclain)  Final    <10,000 COLONIES/mL INSIGNIFICANT GROWTH Performed at [facility] [address], [place],  [address]    Report Status 10/04/2019 FINAL  Final         Radiology Studies: US RENAL  Result Date: 10/06/2019 CLINICAL DATA:  Acute kidney injury EXAM: RENAL / URINARY TRACT ULTRASOUND COMPLETE  COMPARISON:  Renal ultrasound 09/17/2019 FINDINGS: Right Kidney: Renal measurements: 12.1 x 5.6 x 6.4 cm = volume: 225 mL. Increased echogenicity of the cortex, with significant progression. No obstruction or mass. Left Kidney: Renal measurements: 12.1 x 5.6 x 5.0 cm = volume: 169 mL. Increased echogenicity liver, with progression from the prior study. No obstruction or mass. Bladder: Foley catheter. Small amount of urine in the bladder without focal abnormality. Other: Small amount of free fluid around the liver. IMPRESSION: Negative for hydronephrosis. Progressive hyperechoic renal cortex bilaterally compared with the recent study. Electronically Signed   By: Franchot Gallo M.D.   On: 10/06/2019 09:20   DG CHEST PORT 1 VIEW  Result Date: 10/05/2019 CLINICAL DATA:  79 year old male with history of hypoxia. EXAM: PORTABLE CHEST 1 VIEW COMPARISON:  Chest x-ray 09/27/2019. FINDINGS: Patchy multifocal ill-defined opacities and areas of interstitial prominence scattered throughout the lungs bilaterally, most evident throughout the mid to lower lungs. Overall, aeration is essentially unchanged compared to the prior study. Moderate right pleural effusion is unchanged. No left pleural effusion. No pneumothorax. Heart size is mildly enlarged. Upper mediastinal contours are distorted by patient positioning. Aortic atherosclerosis. IMPRESSION: 1. Severe multilobar bilateral pneumonia with moderate right pleural effusion, unchanged. 2. Aortic atherosclerosis. Electronically Signed   By: [name] M.D.   On: 10/05/2019 16:15   ECHOCARDIOGRAM COMPLETE  Result Date: 10/05/2019   ECHOCARDIOGRAM REPORT   Patient Name:   Darrell Mcclain Date of Exam: 10/05/2019 Medical Rec #:  [id number]       Height:  67.0 in Accession #:    [id number]      Weight:       204.1 lb Date of Birth:  11-16-1940        BSA:          2.04 m Patient Age:    62 years        BP:           121/71 mmHg Patient Gender: M               HR:            93 bpm. Exam Location:  Inpatient Procedure: 2D Echo Indications:    Volume overload [[id number]]  History:        Patient has prior history of Echocardiogram examinations, most                 recent 08/23/2015. Risk Factors:Hypertension, Diabetes and                 Dyslipidemia. Vascular dementia. chronic kidney disease. Covid                 19.  Sonographer:    Darrell Mcclain RDCS Referring Phys: [phone] Darrell Mcclain Darrell Mcclain  Sonographer Comments: [place]  1. Left ventricular ejection fraction, by visual estimation, is 60 to 65%. The left ventricle has normal function. There is moderately increased left ventricular hypertrophy.  2. Left ventricular diastolic parameters are consistent with Grade I diastolic dysfunction (impaired relaxation).  3. The left ventricle has no regional wall motion abnormalities.  4. Global right ventricle was not well visualized.The right ventricular size is not well visualized. Not well visualized.  5. Left atrial size was moderately dilated.  6. Right atrial size was not well visualized.  7. Trivial pericardial effusion is present.  8. The mitral valve is normal in structure. Mild to moderate eccentric mitral valve regurgitation.  9. The tricuspid valve is normal in structure. 10. The tricuspid valve is normal in structure. Tricuspid valve regurgitation is moderate. 11. The aortic valve has an indeterminant number of cusps. Aortic valve regurgitation is mild. 12. The pulmonic valve was not well visualized. Pulmonic valve regurgitation is not visualized. 13. The aortic arch was not well visualized and the ascending aorta was not well visualized. 14. Severely elevated pulmonary artery systolic pressure. 15. The tricuspid regurgitant velocity is 3.67 m/s, and with an assumed right atrial pressure of 15 mmHg, the estimated right ventricular systolic pressure is severely elevated at 68.9 mmHg. 16. The inferior vena cava is dilated in size with <50% respiratory  variability, suggesting right atrial pressure of 15 mmHg. 17. The atrial septum is grossly normal. FINDINGS  Left Ventricle: Left ventricular ejection fraction, by visual estimation, is 60 to 65%. The left ventricle has normal function. The left ventricle has no regional wall motion abnormalities. There is moderately increased left ventricular hypertrophy. Concentric left ventricular hypertrophy. Left ventricular diastolic parameters are consistent with Grade I diastolic dysfunction (impaired relaxation). Right Ventricle: The right ventricular size is not well visualized. Not well visualized. Global RV systolic function is was not well visualized. The tricuspid regurgitant velocity is 3.67 m/s, and with an assumed right atrial pressure of 15 mmHg, the estimated right ventricular systolic pressure is severely elevated at 68.9 mmHg. Left Atrium: Left atrial size was moderately dilated. Right Atrium: Right atrial size was not well visualized Pericardium: Trivial pericardial effusion is present. Mitral Valve: The mitral valve is normal in structure. Mild to  moderate eccentric mitral valve regurgitation. Tricuspid Valve: The tricuspid valve is normal in structure. Tricuspid valve regurgitation is moderate. Aortic Valve: The aortic valve has an indeterminant number of cusps. Aortic valve regurgitation is mild. Pulmonic Valve: The pulmonic valve was not well visualized. Pulmonic valve regurgitation is not visualized. Pulmonic regurgitation is not visualized. Aorta: The aortic root is normal in size and structure, the aortic arch was not well visualized and the ascending aorta was not well visualized. Pulmonary Artery: The pulmonary artery is not well seen. Venous: The inferior vena cava is dilated in size with less than 50% respiratory variability, suggesting right atrial pressure of 15 mmHg. IAS/Shunts: The atrial septum is grossly normal.  LEFT VENTRICLE PLAX 2D LVIDd:         4.49 cm  Diastology LVIDs:         2.86 cm   LV e' lateral:   5.55 cm/s LV PW:         1.31 cm  LV E/e' lateral: 8.0 LV IVS:        1.43 cm  LV e' medial:    3.37 cm/s LVOT diam:     1.90 cm  LV E/e' medial:  13.2 LV SV:         61 ml LV SV Index:   28.65 LVOT Area:     2.84 cm  LEFT ATRIUM              Index LA diam:        3.90 cm  1.91 cm/m LA Vol (A2C):   51.6 ml  25.29 ml/m LA Vol (A4C):   109.0 ml 53.43 ml/m LA Biplane Vol: 81.1 ml  39.76 ml/m  AORTIC VALVE LVOT Vmax:   73.90 cm/s LVOT Vmean:  47.300 cm/s LVOT VTI:    0.086 m  AORTA Ao Root diam: 3.20 cm MITRAL VALVE                        TRICUSPID VALVE MV Area (PHT): 2.62 cm             TR Peak grad:   53.9 mmHg MV PHT:        83.81 msec           TR Vmax:        389.00 cm/s MV Decel Time: 289 msec MV E velocity: 44.60 cm/s 103 cm/s  SHUNTS MV Stevana Dufner velocity: 59.20 cm/s 70.3 cm/s Systemic VTI:  0.09 m MV E/Renai Lopata ratio:  0.75       1.5       Systemic Diam: 1.90 cm  Buford Dresser MD Electronically signed by Buford Dresser MD Signature Date/Time: 10/05/2019/3:23:25 PM    Final         Scheduled Meds: . albuterol  2 puff Inhalation Q6H  . atorvastatin  20 mg Oral QPM  . Chlorhexidine Gluconate Cloth  6 each Topical Daily  . donepezil  10 mg Oral QHS  . dorzolamide  1 drop Both Eyes BID  . feeding supplement (PRO-STAT SUGAR FREE 64)  30 mL Oral TID WC  . finasteride  5 mg Oral QPM  . insulin aspart  0-15 Units Subcutaneous Q4H  . liver oil-zinc oxide   Topical QID  . methylPREDNISolone (SOLU-MEDROL) injection  50 mg Intravenous Q12H  . multivitamin with minerals  1 tablet Oral Daily  . nystatin cream   Topical BID  . pantoprazole  40 mg Oral Daily  . rivaroxaban  15 mg Oral Q supper  . sertraline  25 mg Oral Daily  . tamsulosin  0.4 mg Oral Q breakfast   Continuous Infusions: . ceFEPime (MAXIPIME) IV 2 g (10/05/19 2043)  . furosemide 160 mg (10/06/19 1133)  . remdesivir 100 mg in NS 100 mL 100 mg (10/06/19 1333)     LOS: 3 days    Time spent: over 30  min    Fayrene Helper, MD [facility]   To contact the attending provider between 7A-7P or the covering provider during after hours 7P-7A, please log into the web site [email or web address] and access using universal [place] password for that web site. If you do not have the password, please call the hospital operator.  10/06/2019, 1:35 PM

## 2019-10-06 NOTE — Consult Note (Signed)
Renal Service Consult Note [facility]  [facility] 10/06/2019 [facility] Requesting Physician:  Dr [facility], C.   Reason for Consult: AoCKD 3   HPI: The patient is a 79 y.o. year-old with hx of DM2, prostate Ca, memory loss, HTN, Hl, HOH, hx CVA, diast HF, BPH, chronic atrial flutter presented on 09/12/2019 with c/o low O2 sat at [facility] of 60%.  He had tested + a few days earlier. Pt was admitted and started on IV abx and usual IV meds for COVID infection. CXR showed bilat diffuse infiltrates. Pt was confused but pleasant.  Creat was 2.5 on admission and is now up to 2.84. asked to see for renal failure.    He was in hosp here from Jan 1- 15 for osteo of the foot and underwent L leg BKA. He had AKI and creat inc'd from 1.20 to 2.6 upon dc.    ECHO here showed ^^ RA pressures. Normal LVEF.   Pt seen in room at [facility].  Poor historian, though pleasant. Denies any SOB or chest pain or abd pain , no n/v/d.    ROS  denies CP  no joint pain   no HA  no blurry vision  no rash  no diarrhea  no nausea/ vomiting  no dysuria  no difficulty voiding  no change in urine color    Past Medical History  Past Medical History:  Diagnosis Date  . Anticoagulant long-term use    xarelto  . Arthritis    right hand  . At risk for sleep apnea    STOP-BANG==  6      PT ALREADY SET UP TO HAVE STUDY AT [facility] , PCP AWARE  . Atrial flutter, chronic [facility]) dx 01/ 2015   cardiologist-  dr Darrell [facility]  . Bilateral lower extremity edema   . BPH (benign prostatic hyperplasia)   . Cerebral microvascular disease    advanced and multiple foci throughout hemispheres and brain stem-- residual memory issues  . Chronic constipation   . Chronic kidney disease (CKD), stage III (moderate)    nephrologist-  dr [facility])  . Diastolic CHF, chronic ([place])   . Exertional dyspnea   . Foley catheter in place   . GERD (gastroesophageal reflux disease)   . Glaucoma,  both eyes   . History of adenomatous polyp of colon    2012  . History of CVA (cerebrovascular accident)    (pt had no symptoms other than memory issues) per MRI 11-29-2015  very small infarct right frontal lobe and multiple foci throughout hemispheres and brain stem  . 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 Hyperlipidemia   . Hypertension   . Iron deficiency anemia   . Memory loss   . Prostate cancer ([place])   . RBBB (right bundle branch block with left anterior fascicular block)   . Rosacea   . Type 2 diabetes mellitus ([place])   . Urine retention   . Vitamin B 12 deficiency    Past Surgical History  Past Surgical History:  Procedure Laterality Date  . AMPUTATION Left 09/14/2019   Procedure: LEFT BELOW KNEE AMPUTATION;  Surgeon: Darrell Minion, MD;  Location: [place];  Service: Orthopedics;  Laterality: Left;  . CATARACT EXTRACTION W/ INTRAOCULAR LENS  IMPLANT, BILATERAL  2016  . IR THORACENTESIS ASP PLEURAL SPACE W/IMG GUIDE  12/01/2018  . ORIF PROXIMAL HUMEROUS FX AND BICEPS TENODESIS  11/09/2015  . OTHER  SURGICAL HISTORY  1996   reconstruction left toe (amputation repair)  . PROSTATE BIOPSY N/A 07/22/2016   Procedure: BIOPSY TRANSRECTAL ULTRASONIC PROSTATE (TUBP);  Surgeon: Darrell Aloe, MD;  Location: [facility];  Service: Urology;  Laterality: N/A;  . TONSILLECTOMY  1951  . TRANSTHORACIC ECHOCARDIOGRAM  08/23/2015   mild LVH, ef 55-60%, grade 1 diastolitc dysfunction  . WISDOM TOOTH EXTRACTION  1985   Family History  Family History  Problem Relation Age of Onset  . Cancer Sister        lung to brain/smoker  . Colon cancer Neg Hx   . Rectal cancer Neg Hx   . Stomach cancer Neg Hx   . CAD Neg Hx   . Dementia Neg Hx    Social History  reports that he quit smoking about 36 years ago. He has a 40.00 pack-year smoking history. He has never used smokeless tobacco. He reports that he does not drink alcohol or use drugs. Allergies  Allergies  Allergen  Reactions  . Ciprofloxacin Diarrhea  . Neurontin [Gabapentin]     Hallucinations/altered mental status  . Oxycodone Other (See Comments)    Altered mental status   Home medications Prior to Admission medications   Medication Sig Start Date End Date Taking? Authorizing Provider  acetaminophen (TYLENOL) 500 MG tablet Take 500 mg by mouth every 6 (six) hours as needed for mild pain, moderate pain, fever or headache.    Yes [provider]  Amino Acids-Protein Hydrolys (FEEDING SUPPLEMENT, PRO-STAT SUGAR FREE 64,) LIQD Take 30 mLs by mouth 3 (three) times daily with meals.   Yes [provider]  atorvastatin (LIPITOR) 20 MG tablet Take 1 tablet (20 mg total) by mouth daily. Patient taking differently: Take 20 mg by mouth daily. (0900) 09/22/13  Yes Burnell Blanks, MD  donepezil (ARICEPT) 10 MG tablet Take 1 tablet (10 mg total) by mouth at bedtime. Patient taking differently: Take 10 mg by mouth at bedtime. (2100) 11/09/17  Yes Melvenia Beam, MD  dorzolamide (TRUSOPT) 2 % ophthalmic solution Place 1 drop into both eyes 2 (two) times daily. (0900 & 2100) 06/20/15  Yes [provider]  finasteride (PROSCAR) 5 MG tablet Take 5 mg by mouth every evening. (2100) 05/16/16  Yes [provider]  HYDROcodone-acetaminophen (NORCO/VICODIN) 5-325 MG tablet Take 1 tablet by mouth every 4 (four) hours as needed for moderate pain. 09/22/19  Yes Dessa Phi, DO  insulin aspart (NOVOLOG) 100 UNIT/ML injection Inject 2-11 Units into the skin See admin instructions. Per Sliding Scale three times daily and at bedtime. 101-150 2 units 151-200 3 units 201-250 5 units 251-300 7 units 301-350 9 units > 350 11 units   Yes [provider]  insulin glargine (LANTUS) 100 UNIT/ML injection Inject 10 Units into the skin 2 (two) times daily. (0900 & 2100)   Yes [provider]  Melatonin 3 MG TABS Take 3 mg by mouth at bedtime. (2100)   Yes [provider]  Multiple Vitamin (MULTIVITAMIN WITH MINERALS) TABS tablet Take 1 tablet by mouth daily. (0900)   Yes [provider]  omeprazole (PRILOSEC) 20 MG capsule Take 20 mg by mouth daily. (0900) 04/26/11  Yes [provider]  potassium chloride SA (KLOR-CON) 20 MEQ tablet Take 20 mEq by mouth daily. (0900)   Yes [provider]  rivaroxaban (XARELTO) 20 MG TABS tablet Take 1 tablet (20 mg total) by mouth daily with supper. Patient taking differently: Take 20 mg  by mouth daily.  07/23/16  Yes Darrell Aloe, MD  sertraline (ZOLOFT) 25 MG tablet Take 25 mg by mouth daily. (0900)   Yes [provider]  tamsulosin (FLOMAX) 0.4 MG CAPS capsule Take 0.4 mg by mouth daily with breakfast. (0900) 01/09/14  Yes [provider]  Vitamin D, Ergocalciferol, (DRISDOL) 1.25 MG (50000 UT) CAPS capsule Take 50,000 Units by mouth every Wednesday.   Yes [provider]  neomycin-bacitracin-polymyxin (NEOSPORIN) [phone] ointment Apply 1 application topically daily. Left lateral arm skin tear    [provider]   Recent Labs  Lab 09/27/2019 0345 10/09/2019 0345 10/04/19 0302 10/05/19 0310 10/06/19 0440  WBC 8.6   < > 8.1 10.7* 12.1*  NEUTROABS 7.9*  --   --   --  11.6*  HGB 11.1*   < > 11.2* 11.3* 12.1*  HCT 40.3   < > 40.3 40.1 43.5  MCV 87.6   < > 87.2 85.5 86.0  PLT 177   < > 176 158 161   < > = values in this interval not displayed.   Recent Labs  Lab 09/10/2019 0345 10/04/19 0302 10/05/19 0310 10/06/19 0440  NA 144 145 145 147*  K 4.4 4.1 3.8 4.5  CL 105 104 105 107  CO2 _0 GLUCOSE 161* 235* 266* 77  BUN 77* 91* 99* 106*  CREATININE 2.54* 2.62* 2.74* 2.84*  CALCIUM 8.0* 8.1* 8.1* 8.2*  PHOS  --   --  4.7* 4.7*   Iron/TIBC/Ferritin/ %Sat    Component Value Date/Time   FERRITIN 412 (H) 10/06/2019 0440    Vitals:   10/06/19 0500 10/06/19 0752 10/06/19 0800 10/06/19 1202  BP:  116/62  122/62  Pulse:  94  97  Resp:  14   17  Temp:   (!) 97.4 F (36.3 C)   TempSrc:   Axillary   SpO2:  (!) 89%  (!) 85%  Weight: 100.2 kg     Height:        Exam Gen elderly heavy set WM, drowsy but arouses easily No rash, cyanosis or gangrene Sclera anicteric, throat clear  No jvd or bruits Chest clear bilat to bases RRR no MRG Abd soft ntnd no mass or ascites +bs GU normal male w/ foley draining clear yellow urine MS no joint effusions or deformity Ext 2-3+ bilat edema, and L BKA recent Neuro is drowsy     Date   Creat  eGFR   2015- Dec 2020 1.68- 1.92   Jan 1- 14, 2021 1.20 >> 2.62 54 > 23 ml/min   Jan 25  2.54  24    Jan 26  2.62    Jan 27  2.74    Oct 06, 2019  2.84  21   Home meds:  - insulin aspart ssi tid/ insulin glargine 10 bid  - donepezil 10 hs/ sertraline 25 qd/ oxycodone-aceta 5 prn  - tamsulosin 0.4/ finasteride 5 hs  - rivaroxaban 20 qd  - omeprazole 40 qd/ kdur 20  - atorvastatin 20  - prn's/ vitamins/ supplements    UA 1/25 >> negative, prot 30   UA 1/27 >> prot 30, 6-10 rbc/ 0-5 wbc   1/27 -- UNa <10, UCr 61    CXR 1/27 > Severe multilobar bilateral pneumonia with moderate right pleural effusion, unchanged    Renal US 1/28 > 12 / 12 cm kidneys w/o hydro, ^'d echo bilat    Assessment/ Plan: 1. AKI on CKD 3-  not dialysis candidate, may be cardiorenal 2. Vol overload - agree w/ high dose lasix IV 3. COVID + PNA 4. Recent L BKA 5. DM2 6. Dementia      Kelly Splinter  MD 10/06/2019, 4:07 PM

## 2019-10-06 NOTE — Progress Notes (Addendum)
ANTICOAGULATION CONSULT NOTE - Initial Consult  Pharmacy Consult for Xarelto >> Heparin Indication: atrial fibrillation  Allergies  Allergen Reactions  . Ciprofloxacin Diarrhea  . Neurontin [Gabapentin]     Hallucinations/altered mental status  . Oxycodone Other (See Comments)    Altered mental status    Patient Measurements: Height: 5\' 7"  (170.2 cm) Weight: 220 lb 14.4 oz (100.2 kg) IBW/kg (Calculated) : 66.1 Heparin Dosing Weight: 85.6 kg  Vital Signs: Temp: 97.4 F (36.3 C) (01/28 0800) Temp Source: Axillary (01/28 0800) BP: 122/62 (01/28 1202) Pulse Rate: 97 (01/28 1202)  Labs: Recent Labs    10/04/19 0302 10/04/19 0302 10/05/19 0310 10/06/19 0440  HGB 11.2*   < > 11.3* 12.1*  HCT 40.3  --  40.1 43.5  PLT 176  --  158 161  CREATININE 2.62*  --  2.74* 2.84*   < > = values in this interval not displayed.    Estimated Creatinine Clearance: 24.9 mL/min (A) (by C-G formula based on SCr of 2.84 mg/dL (H)).   Medical History: Past Medical History:  Diagnosis Date  . Anticoagulant long-term use    xarelto  . Arthritis    right hand  . At risk for sleep apnea    STOP-BANG==  6      PT ALREADY SET UP TO HAVE STUDY AT [facility] , PCP AWARE  . Atrial flutter, chronic [facility]) dx 01/ 2015   cardiologist-  dr Angelena Form  . Bilateral lower extremity edema   . BPH (benign prostatic hyperplasia)   . Cerebral microvascular disease    advanced and multiple foci throughout hemispheres and brain stem-- residual memory issues  . Chronic constipation   . Chronic kidney disease (CKD), stage III (moderate)    nephrologist-  dr [facility])  . Diastolic CHF, chronic ([place])   . Exertional dyspnea   . Foley catheter in place   . GERD (gastroesophageal reflux disease)   . Glaucoma, both eyes   . History of adenomatous polyp of colon    2012  . History of CVA (cerebrovascular accident)    (pt had no symptoms other than memory issues) per MRI  11-29-2015  very small infarct right frontal lobe and multiple foci throughout hemispheres and brain stem  . 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 Hyperlipidemia   . Hypertension   . Iron deficiency anemia   . Memory loss   . Prostate cancer ([place])   . RBBB (right bundle branch block with left anterior fascicular block)   . Rosacea   . Type 2 diabetes mellitus ([place])   . Urine retention   . Vitamin B 12 deficiency     Medications:  Scheduled:  . albuterol  2 puff Inhalation Q6H  . atorvastatin  20 mg Oral QPM  . Chlorhexidine Gluconate Cloth  6 each Topical Daily  . donepezil  10 mg Oral QHS  . dorzolamide  1 drop Both Eyes BID  . feeding supplement (PRO-STAT SUGAR FREE 64)  30 mL Oral TID WC  . finasteride  5 mg Oral QPM  . insulin aspart  0-15 Units Subcutaneous Q4H  . liver oil-zinc oxide   Topical QID  . methylPREDNISolone (SOLU-MEDROL) injection  50 mg Intravenous Q12H  . multivitamin with minerals  1 tablet Oral Daily  . nystatin cream   Topical BID  . pantoprazole  40 mg Oral Daily  . sertraline  25 mg Oral Daily  . tamsulosin  0.4 mg Oral Q breakfast   Infusions:  . ceFEPime (MAXIPIME) IV 2 g (10/05/19 2043)  . furosemide 160 mg (10/06/19 1133)  . remdesivir 100 mg in NS 100 mL 100 mg (10/06/19 1333)   PRN: acetaminophen, chlorpheniramine-HYDROcodone, fentaNYL (SUBLIMAZE) injection, guaiFENesin-dextromethorphan, HYDROcodone-acetaminophen, ondansetron **OR** ondansetron (ZOFRAN) IV  Assessment: 79 yo male on chronic Xarelto for afib. Admitted with COVID-19 pneumonia. Pharmacy consulted to transition Xarelto to IV heparin due to acute on CKD. Last dose of Xarelto given yesterday 1/27 at 16:33.  Goal of Therapy:  Heparin level 0.3-0.7 units/ml  PTT 66-102 seconds Monitor platelets by anticoagulation protocol: Yes   Plan:   At 16:00 tonight, begin heparin IV infusion 1100 units/hr (~14 units/kg/hr)  Check aPTT in 8hrs (heparin levels will be elevated  with recent xarelto use)  Daily aPTT and HL, will check both until they correlate then titrate heparin based on heparin levels only  Peggyann Juba, PharmD, BCPS Pharmacy: [phone] 10/06/2019,2:04 PM   Addendum: MD requested to use Lovenox instead of Heparin due to poor IV access.  Will begin Lovenox 1mg /kg q24h for CrCl < 30 ml/min.  Will consider checking anti-Xa level at steady state.  Peggyann Juba, PharmD, BCPS 10/06/2019 2:25 PM

## 2019-10-07 DIAGNOSIS — I5033 Acute on chronic diastolic (congestive) heart failure: Secondary | ICD-10-CM

## 2019-10-07 DIAGNOSIS — U071 COVID-19: Principal | ICD-10-CM

## 2019-10-07 DIAGNOSIS — N179 Acute kidney failure, unspecified: Secondary | ICD-10-CM

## 2019-10-07 LAB — CBC WITH DIFFERENTIAL/PLATELET
Abs Immature Granulocytes: 0.08 10*3/uL — ABNORMAL HIGH (ref 0.00–0.07)
Basophils Absolute: 0 10*3/uL (ref 0.0–0.1)
Basophils Relative: 0 %
Eosinophils Absolute: 0 10*3/uL (ref 0.0–0.5)
Eosinophils Relative: 0 %
HCT: 47.4 % (ref 39.0–52.0)
Hemoglobin: 13.1 g/dL (ref 13.0–17.0)
Immature Granulocytes: 1 %
Lymphocytes Relative: 1 %
Lymphs Abs: 0.1 10*3/uL — ABNORMAL LOW (ref 0.7–4.0)
MCH: 23.8 pg — ABNORMAL LOW (ref 26.0–34.0)
MCHC: 27.6 g/dL — ABNORMAL LOW (ref 30.0–36.0)
MCV: 86 fL (ref 80.0–100.0)
Monocytes Absolute: 0.2 10*3/uL (ref 0.1–1.0)
Monocytes Relative: 2 %
Neutro Abs: 12.3 10*3/uL — ABNORMAL HIGH (ref 1.7–7.7)
Neutrophils Relative %: 96 %
Platelets: 146 10*3/uL — ABNORMAL LOW (ref 150–400)
RBC: 5.51 MIL/uL (ref 4.22–5.81)
RDW: 19.5 % — ABNORMAL HIGH (ref 11.5–15.5)
WBC: 12.7 10*3/uL — ABNORMAL HIGH (ref 4.0–10.5)
nRBC: 0.3 % — ABNORMAL HIGH (ref 0.0–0.2)

## 2019-10-07 LAB — D-DIMER, QUANTITATIVE: D-Dimer, Quant: 13.19 ug/mL-FEU — ABNORMAL HIGH (ref 0.00–0.50)

## 2019-10-07 LAB — COMPREHENSIVE METABOLIC PANEL
ALT: 33 U/L (ref 0–44)
AST: 24 U/L (ref 15–41)
Albumin: 2.2 g/dL — ABNORMAL LOW (ref 3.5–5.0)
Alkaline Phosphatase: 171 U/L — ABNORMAL HIGH (ref 38–126)
Anion gap: 14 (ref 5–15)
BUN: 111 mg/dL — ABNORMAL HIGH (ref 8–23)
CO2: 26 mmol/L (ref 22–32)
Calcium: 8.3 mg/dL — ABNORMAL LOW (ref 8.9–10.3)
Chloride: 106 mmol/L (ref 98–111)
Creatinine, Ser: 3.04 mg/dL — ABNORMAL HIGH (ref 0.61–1.24)
GFR calc Af Amer: 22 mL/min — ABNORMAL LOW (ref >60)
GFR calc non Af Amer: 19 mL/min — ABNORMAL LOW (ref >60)
Glucose, Bld: 198 mg/dL — ABNORMAL HIGH (ref 70–99)
Potassium: 4.5 mmol/L (ref 3.5–5.1)
Sodium: 146 mmol/L — ABNORMAL HIGH (ref 135–145)
Total Bilirubin: 0.6 mg/dL (ref 0.3–1.2)
Total Protein: 5.8 g/dL — ABNORMAL LOW (ref 6.5–8.1)

## 2019-10-07 LAB — GLUCOSE, CAPILLARY
Glucose-Capillary: 186 mg/dL — ABNORMAL HIGH (ref 70–99)
Glucose-Capillary: 174 mg/dL — ABNORMAL HIGH (ref 70–99)
Glucose-Capillary: 171 mg/dL — ABNORMAL HIGH (ref 70–99)
Glucose-Capillary: 201 mg/dL — ABNORMAL HIGH (ref 70–99)
Glucose-Capillary: 195 mg/dL — ABNORMAL HIGH (ref 70–99)

## 2019-10-07 LAB — C-REACTIVE PROTEIN: CRP: 13.2 mg/dL — ABNORMAL HIGH (ref <1.0)

## 2019-10-07 LAB — MAGNESIUM: Magnesium: 2.3 mg/dL (ref 1.7–2.4)

## 2019-10-07 LAB — FERRITIN: Ferritin: 524 ng/mL — ABNORMAL HIGH (ref 24–336)

## 2019-10-07 LAB — PHOSPHORUS: Phosphorus: 5.6 mg/dL — ABNORMAL HIGH (ref 2.5–4.6)

## 2019-10-07 LAB — PROCALCITONIN: Procalcitonin: 0.55 ng/mL

## 2019-10-07 MED ORDER — SODIUM CHLORIDE 0.9 % IV SOLN
0.5000 mg/h | INTRAVENOUS | Status: DC
  Administered 2019-10-07: 0.5 mg/h via INTRAVENOUS
  Filled 2019-10-07: qty 5

## 2019-10-07 MED ORDER — HYDROMORPHONE HCL 1 MG/ML IJ SOLN
0.5000 mg | INTRAMUSCULAR | Status: DC | PRN
  Administered 2019-10-07: 0.5 mg via INTRAVENOUS
  Filled 2019-10-07: qty 0.5

## 2019-10-07 MED ORDER — HYDROMORPHONE HCL 1 MG/ML IJ SOLN: 0.5000 mg | INTRAMUSCULAR | Status: DC | PRN

## 2019-10-07 MED ORDER — POLYVINYL ALCOHOL 1.4 % OP SOLN
1.0000 [drp] | Freq: Four times a day (QID) | OPHTHALMIC | Status: DC | PRN
  Filled 2019-10-07: qty 15

## 2019-10-07 MED ORDER — ACETAMINOPHEN 325 MG PO TABS: 650.0000 mg | ORAL_TABLET | Freq: Four times a day (QID) | ORAL | Status: DC | PRN

## 2019-10-07 MED ORDER — ONDANSETRON HCL 4 MG/2ML IJ SOLN: 4.0000 mg | Freq: Four times a day (QID) | INTRAMUSCULAR | Status: DC | PRN

## 2019-10-07 MED ORDER — LORAZEPAM 1 MG PO TABS
1.0000 mg | ORAL_TABLET | ORAL | Status: DC | PRN
  Administered 2019-10-07: 1 mg via ORAL
  Filled 2019-10-07: qty 1

## 2019-10-07 MED ORDER — FENTANYL CITRATE (PF) 100 MCG/2ML IJ SOLN: 25.0000 ug | INTRAMUSCULAR | Status: DC | PRN

## 2019-10-07 MED ORDER — BIOTENE DRY MOUTH MT LIQD: 15.0000 mL | OROMUCOSAL | Status: DC | PRN

## 2019-10-07 MED ORDER — HALOPERIDOL LACTATE 2 MG/ML PO CONC
0.5000 mg | ORAL | Status: DC | PRN
  Filled 2019-10-07: qty 0.3

## 2019-10-07 MED ORDER — GLYCOPYRROLATE 1 MG PO TABS
1.0000 mg | ORAL_TABLET | ORAL | Status: DC | PRN
  Filled 2019-10-07: qty 1

## 2019-10-07 MED ORDER — GLYCOPYRROLATE 0.2 MG/ML IJ SOLN: 0.2000 mg | INTRAMUSCULAR | Status: DC | PRN

## 2019-10-07 MED ORDER — ONDANSETRON 4 MG PO TBDP: 4.0000 mg | ORAL_TABLET | Freq: Four times a day (QID) | ORAL | Status: DC | PRN

## 2019-10-07 MED ORDER — HALOPERIDOL LACTATE 5 MG/ML IJ SOLN: 0.5000 mg | INTRAMUSCULAR | Status: DC | PRN

## 2019-10-07 MED ORDER — LORAZEPAM 2 MG/ML PO CONC: 1.0000 mg | ORAL | Status: DC | PRN

## 2019-10-07 MED ORDER — HALOPERIDOL 0.5 MG PO TABS
0.5000 mg | ORAL_TABLET | ORAL | Status: DC | PRN
  Filled 2019-10-07: qty 1

## 2019-10-07 MED ORDER — ACETAMINOPHEN 650 MG RE SUPP: 650.0000 mg | Freq: Four times a day (QID) | RECTAL | Status: DC | PRN

## 2019-10-07 MED ORDER — HYDROMORPHONE BOLUS VIA INFUSION
0.5000 mg | INTRAVENOUS | Status: DC | PRN
  Administered 2019-10-07 (×2): 0.5 mg via INTRAVENOUS
  Filled 2019-10-07: qty 1

## 2019-10-07 MED ORDER — LORAZEPAM 2 MG/ML IJ SOLN: 1.0000 mg | INTRAMUSCULAR | Status: DC | PRN

## 2019-10-08 LAB — CULTURE, BLOOD (ROUTINE X 2): Culture: NO GROWTH

## 2019-10-10 NOTE — Progress Notes (Signed)
Patient has not transitioned to comfort care, will sign off.   Kelly Splinter, MD 21-Oct-2019, 3:36 PM

## 2019-10-10 NOTE — Progress Notes (Signed)
Wasted dilaudid gtt in Rec[employer] consultant 29ml with Lindwood Coke RN as a witness. Called Rx to ask guidance for documenting waste and was told to place a progress note. Attempted to waste in pyxis but gtt was not populating.

## 2019-10-10 NOTE — Death Summary Note (Signed)
Death Summary  Carrell Rahmani HYW:[id number] DOB: 1941-06-15 DOA: 2019-10-27  PCP: Burnard Bunting, MD  Admit date: 2019/10/27 Date of Death: 10-31-2019 Time of Death: 19:34 Notification: Burnard Bunting, MD notified of death of 2019-10-31   History of present illness:  Darrell Mcclain is a 79 y.o. male with a history of diabetes mellitus, vascular dementia, diastolic CHF, paroxysmal atrial fibrillation on Xarelto, chronic kidney disease stage IV, and chronic hypoxic respiratory failure who had a positive COVID-19 test on 09/27/2019 though was reportedly not exhibiting symptoms of the infection until several days later, was noted at his SNF to be hypoxic with saturation of 60% on his usual 2 L/min of supplemental oxygen.  It was on October 27, 2019 that he was found to be hypoxic at the nursing facility and sent to the [facility], ED.   In the emergency department, patient was saturating in the upper 80s on 15 Lpm supplemental oxygen, had diffuse infiltrates on chest x-ray, was treated with Decadron, Lasix, cefepime, and vancomycin, and admitted to the hospital.  Patient continued to be treated with remdesivir and steroids, and Actemra was considered though he was felt not to be a candidate for this due to chronic osteomyelitis and very poor prognosis.  Goals of care was discussed with the patient's daughter on the day of admission; she confirmed that he would want to be DNR/DNI but felt that he would want [place] to continue his current treatment for COVID-19 but would not want [place] to escalate care transferred to the ICU if he were to worsen.  Daughter also indicated on day of admission that if he were to decompensate, patient would likely want [place] to focus on comfort measures only.  His course was complicated by likely type II NSTEMI, acute on chronic CHF, and worsening renal function.  Despite ongoing treatment including diuresis and nephrology consultation, the patient's condition continued to worsen and family  felt that patient wants to shift our focus to keeping him comfortable.  Patient was transitioned to comfort care only on October 31, 2019 and expired that evening.  Final Diagnoses:  1.   COVID-19 pneumonia  2.   Acute on chronic hypoxic respiratory failure  3.   Type II NSTEMI 4.   Acute kidney injury superimposed on CKD IV  5.   Acute on chronic diastolic CHF    The results of significant diagnostics from this hospitalization (including imaging, microbiology, ancillary and laboratory) are listed below for reference.    Significant Diagnostic Studies: DG Orbits  Result Date: 10/04/19 CLINICAL DATA:  Initial evaluation for MRI clearance. EXAM: ORBITS - COMPLETE 4+ VIEW COMPARISON:  None. FINDINGS: There is no evidence of fracture or other significant bone abnormality. No orbital emphysema or sinus air-fluid levels are seen. IMPRESSION: Negative.  No metallic foreign body seen overlying either orbit. Electronically Signed   By: Jeannine Boga M.D.   On: 10-04-19 00:47   CT HEAD WO CONTRAST  Result Date: 09/17/2019 CLINICAL DATA:  Encephalopathy EXAM: CT HEAD WITHOUT CONTRAST TECHNIQUE: Contiguous axial images were obtained from the base of the skull through the vertex without intravenous contrast. COMPARISON:  Correlation made with MRI from 2017 FINDINGS: Brain: There is no acute intracranial hemorrhage, mass-effect, or edema. Gray-white differentiation is preserved. Confluent areas of hypoattenuation in the supratentorial white matter are nonspecific but probably reflect moderate to advanced chronic microvascular ischemic changes. There are small cortical right frontal and parietal cortical infarcts. There is no extra-axial fluid collection. Prominence of ventricles and sulci reflects generalized parenchymal volume loss.  Vascular: There is atherosclerotic calcification at the skull base. Skull: Calvarium is unremarkable. Sinuses/Orbits: No acute finding. Other: None. IMPRESSION: No acute  intracranial abnormality. Moderate to advanced chronic microvascular ischemic changes. Small chronic right frontoparietal cortical infarcts. Electronically Signed   By: Macy Mis M.D.   On: 09/17/2019 18:11   CT CHEST WO CONTRAST  Result Date: 09/12/2019 CLINICAL DATA:  Chronic respiratory failure EXAM: CT CHEST WITHOUT CONTRAST TECHNIQUE: Multidetector CT imaging of the chest was performed following the standard protocol without IV contrast. COMPARISON:  Chest x-ray from earlier in the same day, CT from 11/16/2018 FINDINGS: Cardiovascular: Somewhat limited due to lack of IV contrast. Aortic calcifications are seen without aneurysmal dilatation. Heavy coronary calcifications are noted. Cardiac shadow is at the upper limits of normal in size. Mediastinum/Nodes: Thoracic inlet shows a hypodense lesion within right lobe of the thyroid similar to that noted on the prior exam. Scattered mediastinal adenopathy is noted most marked in the right paratracheal region similar to that seen on the prior exam. Scattered smaller mediastinal nodes are seen. Evaluation for hilar adenopathy is limited due to the lack of IV contrast. The esophagus appears within normal limits. Lungs/Pleura: Volume loss is noted on the right with mediastinal shift to the right. Small right-sided pleural effusion is noted but significantly decreased when compared with the prior CT examination. This may have a more loculated chronic component present. Prominent soft tissue component is seen which measures approximately 3.1 cm in greatest dimension decreased from 4.4 cm on the prior exam. The lower lobe rounded area of atelectatic change is again identified and relatively stable. No new focal mass is seen. Large left pleural effusion is seen with associated atelectatic change in the lower lobe. There is a vague area of increased density identified along the lateral aspect of the left hemithorax with extension into the intercostal space between the  eighth and ninth ribs on the left. This was not seen on the prior exam and the possibility of a focal mass could not be totally excluded. Upper Abdomen: Visualized upper abdomen shows no acute abnormality. Musculoskeletal: Degenerative changes of the thoracic spine are seen. No acute rib abnormality is noted. 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 IMPRESSION: Large left pleural effusion with associated basilar atelectasis. There is an area of soft tissue density which appears to extend into the inter costal space between the eighth and ninth ribs laterally. The possibility of underlying mass deserves consideration and thoracentesis with cytology evaluation is recommended. Persistent rounded densities within the right middle and right lower lobe when compared with the prior exam. The overall appearance has decreased somewhat in the interval and these again likely represent areas of rounded atelectasis and scarring. Small loculated right pleural effusion is noted. Stable right thyroid nodule measuring approximately 1 cm. No followup recommended (ref: J Am Coll Radiol. 2015 Feb;12(2): 143-50). Aortic Atherosclerosis (ICD10-I70.0). Electronically Signed   By: Inez Catalina M.D.   On: 09/12/2019 17:17   MR HEEL LEFT W WO CONTRAST  Result Date: 09/10/2019 CLINICAL DATA:  Left heel wound, diabetes EXAM: MRI OF LOWER LEFT EXTREMITY WITHOUT AND WITH CONTRAST TECHNIQUE: Multiplanar, multisequence MR imaging of the left hindfoot was performed both before and after administration of intravenous contrast. CONTRAST:  14mL GADAVIST GADOBUTROL 1 MMOL/ML IV SOLN COMPARISON:  X-ray 09/09/2019 FINDINGS: Bones/Joint/Cartilage Loss of cortical definition of the posterolateral aspect of the left calcaneus with extensive bone marrow edema and enhancement. There is confluent low T1 signal compatible with acute osteomyelitis (  series 6, images 24-28; series 12, image 14). The abnormal marrow signal is confined to the  posterolateral calcaneus and measures up to 1 cm in AP depth with a thin amount of abnormal signal extending along the lateral aspect of the posterior calcaneus closely approximating the peroneal tubercle (series 4, image 24). There is involvement of the lateral most aspect of the distal Achilles tendon insertion (series 4, image 23) as well as the attachment of the plantar fascia (series 13, image 14). The remaining marrow signal of the calcaneal body is preserved. Remaining marrow structures of the hindfoot are intact. There are no fractures. Scattered degenerative changes involving the midfoot and subtalar joints. No talar dome chondral defect. No tibiotalar or subtalar joint effusion. Ligaments Medial and lateral ankle ligaments are grossly intact. Muscles and Tendons Diffuse intramuscular edema which may reflect denervation changes versus myositis. Achilles tendon intact. Peroneal tendons, posteromedial ankle tendons, and anterior ankle tendons are intact. Plantar fascia remains intact. Soft tissues Large soft tissue ulceration overlying the posterolateral aspect of the calcaneus closely approximating the underlying bone. There is poorly enhancing tissue in this region compatible with tissue necrosis (series 19, image 17). Marked circumferential subcutaneous edema without a well-defined fluid collection. IMPRESSION: 1. 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 Abnormal marrow signal involves the lateral most aspect of the distal Achilles tendon insertion as well as the attachment of the plantar fascia. 2. Marked circumferential subcutaneous edema without a well-defined fluid collection. 3. Diffuse intramuscular edema which may reflect denervation changes versus myositis. Electronically Signed   By: Davina Poke D.O.   On: 09/10/2019 09:13   MR HEEL RIGHT W WO CONTRAST  Result Date:  09/10/2019 CLINICAL DATA:  Right heel ulcer, diabetic, peripheral vascular disease EXAM: MR OF THE RIGHT HEEL WITHOUT AND WITH CONTRAST TECHNIQUE: Multiplanar, multisequence MR imaging of the right heel was performed both before and after administration of intravenous contrast. CONTRAST:  63mL GADAVIST GADOBUTROL 1 MMOL/ML IV SOLN COMPARISON:  None available. FINDINGS: Bones/Joint/Cartilage No acute fracture or malalignment. Small os trigonum with associated marrow edema. Remaining visualized marrow structures are within normal limits without abnormal bone marrow signal. No cortical destruction. Tibiotalar joint is intact without chondral defect or joint effusion. Subtalar joints are intact without chondral defect or joint effusion. Ligaments The medial and lateral ankle ligaments are intact. Lisfranc ligament intact. Muscles and Tendons Diffuse muscular atrophy and fatty infiltration. Diffuse edema like signal throughout the visualized musculature suggesting denervation changes or myositis. Achilles tendon intact. Peroneal tendons intact. Posteromedial and anterior ankle tendons are intact. Soft tissues There are foci of metallic susceptibility artifact adjacent to the plantar cortex of the posterior calcaneus (series 5, image 10). Circumferential soft tissue edema and enhancement suggesting cellulitis. No deep soft tissue ulceration. No well-defined or drainable soft tissue fluid collection. IMPRESSION: 1. Circumferential soft tissue edema and enhancement suggesting cellulitis. No well-defined or drainable soft tissue fluid collection. 2. No evidence of osteomyelitis. 3. Os trigonum with associated mild marrow edema, which can be seen in the setting of posterior ankle impingement. 4. Diffuse edema-like signal throughout the visualized musculature suggesting denervation changes or myositis. 5. Small foci of susceptibility artifact adjacent to the plantar cortex of the posterior calcaneus, suggesting small metallic  foreign bodies. Radiographic correlation would be helpful for further evaluation if clinically indicated. Electronically Signed   By: Davina Poke D.O.   On: 09/10/2019 17:25   US RENAL  Result Date: 10/06/2019 CLINICAL DATA:  Acute kidney injury EXAM: RENAL / URINARY TRACT ULTRASOUND COMPLETE COMPARISON:  Renal ultrasound 09/17/2019 FINDINGS: Right Kidney: Renal measurements: 12.1 x 5.6 x 6.4 cm = volume: 225 mL. Increased echogenicity of the cortex, with significant progression. No obstruction or mass. Left Kidney: Renal measurements: 12.1 x 5.6 x 5.0 cm = volume: 169 mL. Increased echogenicity liver, with progression from the prior study. No obstruction or mass. Bladder: Foley catheter. Small amount of urine in the bladder without focal abnormality. Other: Small amount of free fluid around the liver. IMPRESSION: Negative for hydronephrosis. Progressive hyperechoic renal cortex bilaterally compared with the recent study. Electronically Signed   By: Franchot Gallo M.D.   On: 10/06/2019 09:20   US Renal  Result Date: 09/17/2019 CLINICAL DATA:  Acute renal failure EXAM: RENAL / URINARY TRACT ULTRASOUND COMPLETE COMPARISON:  CT of the pelvis dated 10/20/2018 FINDINGS: Right Kidney: Renal measurements: 10.6 x 5.4 x 5.3 cm = volume: 159 mL. There is no hydronephrosis. There is increased cortical echogenicity. Left Kidney: Renal measurements: 12.8 x 6.6 x 5.7 cm 252 = volume: 252 mL. There is no hydronephrosis. There is increased cortical echogenicity. Bladder: Appears normal for degree of bladder distention. Both ureteral jets were visualized. Other: None. IMPRESSION: 1. No acute abnormality.  No evidence for hydronephrosis. 2. Echogenic kidneys bilaterally which can be seen in patients with medical renal disease. Electronically Signed   By: Constance Holster M.D.   On: 09/17/2019 19:30   DG CHEST PORT 1 VIEW  Result Date: 10/05/2019 CLINICAL DATA:  79 year old male with history of hypoxia. EXAM:  PORTABLE CHEST 1 VIEW COMPARISON:  Chest x-ray 09/12/2019. FINDINGS: Patchy multifocal ill-defined opacities and areas of interstitial prominence scattered throughout the lungs bilaterally, most evident throughout the mid to lower lungs. Overall, aeration is essentially unchanged compared to the prior study. Moderate right pleural effusion is unchanged. No left pleural effusion. No pneumothorax. Heart size is mildly enlarged. Upper mediastinal contours are distorted by patient positioning. Aortic atherosclerosis. IMPRESSION: 1. Severe multilobar bilateral pneumonia with moderate right pleural effusion, unchanged. 2. Aortic atherosclerosis. Electronically Signed   By: [name] M.D.   On: 10/05/2019 16:15   DG CHEST PORT 1 VIEW  Result Date: 09/20/2019 CLINICAL DATA:  Hypoxemia EXAM: PORTABLE CHEST 1 VIEW COMPARISON:  09/17/2019 FINDINGS: Cardiomegaly with vascular congestion. Bilateral interstitial prominence and airspace disease again noted, right greater than left. Right pleural effusion is stable. Improvement of the left pleural effusion. Old left rib fractures again noted, unchanged. IMPRESSION: Bilateral interstitial and airspace opacities, right greater than left, stable since prior study. Stable small right effusion.  Improved left effusion. Electronically Signed   By: Rolm Baptise M.D.   On: 09/19/2019 19:09   DG Chest Portable 1 View  Result Date: 09/23/2019 CLINICAL DATA:  Shortness of breath, COVID-19 positive EXAM: PORTABLE CHEST 1 VIEW COMPARISON:  Radiograph 09/17/2019 FINDINGS: Patient is rotated in a steep left anterior obliquity. Bilateral pleural effusions, similar to possibly increasing from comparison exam with mixed interstitial and patchy airspace opacity throughout both lungs slightly worsened in the right mid to upper lung. Cardiac silhouette is enlarged though partially obscured by overlying opacity with a rightward shift comparable to comparison CT. Tortuosity of the trachea  is similar to prior. No visible pneumothorax. No acute osseous or soft tissue abnormality. Postsurgical changes from prior right humeral ORIF. Remote posttraumatic changes of the left acromioclavicular joint. Telemetry leads overlie the chest. IMPRESSION: Bilateral pleural effusions, similar to slightly  increased from comparison. Persistent bilateral mixed interstitial and airspace disease. Could reflect edema and/or infection superimposed on areas of rounded atelectasis seen on comparison CT. Persistent cardiomegaly. Electronically Signed   By: Lovena Le M.D.   On: 09/19/2019 02:22   DG CHEST PORT 1 VIEW  Result Date: 09/17/2019 CLINICAL DATA:  In cephalopathy EXAM: PORTABLE CHEST 1 VIEW COMPARISON:  09/12/2019 FINDINGS: Moderate bilateral pleural effusions, increasing on the left since prior study. Bilateral lower lobe airspace opacities. Cardiomegaly, vascular congestion. Multiple old left rib fractures. IMPRESSION: Moderate bilateral pleural effusions with bibasilar atelectasis or infiltrates, worsening on the left since prior study. Cardiomegaly, vascular congestion. Electronically Signed   By: Rolm Baptise M.D.   On: 09/17/2019 20:25   DG CHEST PORT 1 VIEW  Result Date: 09/12/2019 CLINICAL DATA:  Status post thoracentesis. EXAM: PORTABLE CHEST 1 VIEW COMPARISON:  September 10, 2019 FINDINGS: There is no pneumothorax. There is persistent, likely loculated right-sided pleural effusion. The heart size is mildly enlarged. Interstitial edema is again noted. The left-sided pleural effusion has improved from prior study. Aortic calcifications are noted. There is no acute osseous abnormality. IMPRESSION: 1. No pneumothorax. 2. Persistent moderate-sized loculated right-sided pleural effusion. Small left-sided pleural effusion. 3. Bilateral airspace disease which may represent atelectasis or consolidation, greatest within the right lung base. Electronically Signed   By: Constance Holster M.D.   On: 09/12/2019  15:10   DG CHEST PORT 1 VIEW  Result Date: 09/10/2019 CLINICAL DATA:  Right pleural effusion. EXAM: PORTABLE CHEST 1 VIEW COMPARISON:  Chest radiograph 08/20/2019 FINDINGS: Stable cardiomediastinal contours with enlarged heart size. Mild diffuse bilateral interstitial opacities. Stable to slight increase in size of a loculated moderate right pleural effusion. Persistent right basilar opacity. Mild opacities at the left lung base may reflect atelectasis and or infiltrate with likely small volume pleural fluid. No evidence of pneumothorax IMPRESSION: 1. Stable to slight increase in size of a loculated moderate right pleural effusion. 2. Diffuse mild interstitial opacities suspect mild edema. 3. Persistent bibasilar pulmonary opacities. Electronically Signed   By: Audie Pinto M.D.   On: 09/10/2019 11:43   DG Foot Complete Left  Result Date: 09/09/2019 CLINICAL DATA:  Left heel wound. Concern for osteomyelitis. Recent left hip fracture. EXAM: LEFT FOOT - COMPLETE 3+ VIEW COMPARISON:  None. FINDINGS: Presumed surgical absence of the second phalanx. Vascular calcifications. Lucency superficial to the calcaneus, likely the site of soft tissue ulcer. No soft tissue gas. No radiopaque foreign object. No osseous destruction. Diffuse soft tissue swelling about the dorsal forefoot. IMPRESSION: No plain film evidence of osteomyelitis. Forefoot soft tissue swelling. Electronically Signed   By: Abigail Miyamoto M.D.   On: 09/09/2019 17:00   VAS [place] ABI WITH/WO TBI  Result Date: 09/11/2019 LOWER EXTREMITY DOPPLER STUDY Indications: Ulceration, and gangrene.  Comparison Study: No prior study. Performing Technologist: Sharion Dove RVS  Examination Guidelines: A complete evaluation includes at minimum, Doppler waveform signals and systolic blood pressure reading at the level of bilateral brachial, anterior tibial, and posterior tibial arteries, when vessel segments are accessible. Bilateral testing is considered an  integral part of a complete examination. Photoelectric Plethysmograph (PPG) waveforms and toe systolic pressure readings are included as required and additional duplex testing as needed. Limited examinations for reoccurring indications may be performed as noted.  ABI Findings: +---------+------------------+-----+---------+--------+ Right    Rt Pressure (mmHg)IndexWaveform Comment  +---------+------------------+-----+---------+--------+ Brachial 127  triphasic         +---------+------------------+-----+---------+--------+ PTA      136               1.07 biphasic          +---------+------------------+-----+---------+--------+ DP       156               1.23 biphasic          +---------+------------------+-----+---------+--------+ Great Toe79                0.62                   +---------+------------------+-----+---------+--------+ +---------+------------------+-----+---------+-------+ Left     Lt Pressure (mmHg)IndexWaveform Comment +---------+------------------+-----+---------+-------+ Brachial 124                    triphasic        +---------+------------------+-----+---------+-------+ PTA      112               0.88 biphasic         +---------+------------------+-----+---------+-------+ DP       159               1.25 biphasic         +---------+------------------+-----+---------+-------+ Great Toe70                0.55                  +---------+------------------+-----+---------+-------+ +-------+-----------+-----------+------------+------------+ ABI/TBIToday's ABIToday's TBIPrevious ABIPrevious TBI +-------+-----------+-----------+------------+------------+ Right  1.23       0.62                                +-------+-----------+-----------+------------+------------+ Left   1.25       0.55                                +-------+-----------+-----------+------------+------------+  Summary: Right: Resting right  ankle-brachial index is within normal range. No evidence of significant right lower extremity arterial disease. The right toe-brachial index is abnormal. Left: Resting left ankle-brachial index is within normal range. No evidence of significant left lower extremity arterial disease. The left toe-brachial index is abnormal.  *See table(s) above for measurements and observations.  Electronically signed by Deitra Mayo MD on 09/11/2019 at 5:04:43 PM.   Final    ECHOCARDIOGRAM COMPLETE  Result Date: 10/05/2019   ECHOCARDIOGRAM REPORT   Patient Name:   THURL BOEN Date of Exam: 10/05/2019 Medical Rec #:  [id number]       Height:       67.0 in Accession #:    [id number]      Weight:       204.1 lb Date of Birth:  04-27-1941        BSA:          2.04 m Patient Age:    69 years        BP:           121/71 mmHg Patient Gender: M               HR:           93 bpm. Exam Location:  Inpatient Procedure: 2D Echo Indications:    Volume overload [[id number]]  History:        Patient has prior history of Echocardiogram examinations, most  recent 08/23/2015. Risk Factors:Hypertension, Diabetes and                 Dyslipidemia. Vascular dementia. chronic kidney disease. Covid                 19.  Sonographer:    Darlina Sicilian RDCS Referring Phys: [phone] A CALDWELL POWELL JR  Sonographer Comments: [place]  1. Left ventricular ejection fraction, by visual estimation, is 60 to 65%. The left ventricle has normal function. There is moderately increased left ventricular hypertrophy.  2. Left ventricular diastolic parameters are consistent with Grade I diastolic dysfunction (impaired relaxation).  3. The left ventricle has no regional wall motion abnormalities.  4. Global right ventricle was not well visualized.The right ventricular size is not well visualized. Not well visualized.  5. Left atrial size was moderately dilated.  6. Right atrial size was not well visualized.  7. Trivial pericardial  effusion is present.  8. The mitral valve is normal in structure. Mild to moderate eccentric mitral valve regurgitation.  9. The tricuspid valve is normal in structure. 10. The tricuspid valve is normal in structure. Tricuspid valve regurgitation is moderate. 11. The aortic valve has an indeterminant number of cusps. Aortic valve regurgitation is mild. 12. The pulmonic valve was not well visualized. Pulmonic valve regurgitation is not visualized. 13. The aortic arch was not well visualized and the ascending aorta was not well visualized. 14. Severely elevated pulmonary artery systolic pressure. 15. The tricuspid regurgitant velocity is 3.67 m/s, and with an assumed right atrial pressure of 15 mmHg, the estimated right ventricular systolic pressure is severely elevated at 68.9 mmHg. 16. The inferior vena cava is dilated in size with <50% respiratory variability, suggesting right atrial pressure of 15 mmHg. 17. The atrial septum is grossly normal. FINDINGS  Left Ventricle: Left ventricular ejection fraction, by visual estimation, is 60 to 65%. The left ventricle has normal function. The left ventricle has no regional wall motion abnormalities. There is moderately increased left ventricular hypertrophy. Concentric left ventricular hypertrophy. Left ventricular diastolic parameters are consistent with Grade I diastolic dysfunction (impaired relaxation). Right Ventricle: The right ventricular size is not well visualized. Not well visualized. Global RV systolic function is was not well visualized. The tricuspid regurgitant velocity is 3.67 m/s, and with an assumed right atrial pressure of 15 mmHg, the estimated right ventricular systolic pressure is severely elevated at 68.9 mmHg. Left Atrium: Left atrial size was moderately dilated. Right Atrium: Right atrial size was not well visualized Pericardium: Trivial pericardial effusion is present. Mitral Valve: The mitral valve is normal in structure. Mild to moderate eccentric  mitral valve regurgitation. Tricuspid Valve: The tricuspid valve is normal in structure. Tricuspid valve regurgitation is moderate. Aortic Valve: The aortic valve has an indeterminant number of cusps. Aortic valve regurgitation is mild. Pulmonic Valve: The pulmonic valve was not well visualized. Pulmonic valve regurgitation is not visualized. Pulmonic regurgitation is not visualized. Aorta: The aortic root is normal in size and structure, the aortic arch was not well visualized and the ascending aorta was not well visualized. Pulmonary Artery: The pulmonary artery is not well seen. Venous: The inferior vena cava is dilated in size with less than 50% respiratory variability, suggesting right atrial pressure of 15 mmHg. IAS/Shunts: The atrial septum is grossly normal.  LEFT VENTRICLE PLAX 2D LVIDd:         4.49 cm  Diastology LVIDs:         2.86 cm  LV e'  lateral:   5.55 cm/s LV PW:         1.31 cm  LV E/e' lateral: 8.0 LV IVS:        1.43 cm  LV e' medial:    3.37 cm/s LVOT diam:     1.90 cm  LV E/e' medial:  13.2 LV SV:         61 ml LV SV Index:   28.65 LVOT Area:     2.84 cm  LEFT ATRIUM              Index LA diam:        3.90 cm  1.91 cm/m LA Vol (A2C):   51.6 ml  25.29 ml/m LA Vol (A4C):   109.0 ml 53.43 ml/m LA Biplane Vol: 81.1 ml  39.76 ml/m  AORTIC VALVE LVOT Vmax:   73.90 cm/s LVOT Vmean:  47.300 cm/s LVOT VTI:    0.086 m  AORTA Ao Root diam: 3.20 cm MITRAL VALVE                        TRICUSPID VALVE MV Area (PHT): 2.62 cm             TR Peak grad:   53.9 mmHg MV PHT:        83.81 msec           TR Vmax:        389.00 cm/s MV Decel Time: 289 msec MV E velocity: 44.60 cm/s 103 cm/s  SHUNTS MV A velocity: 59.20 cm/s 70.3 cm/s Systemic VTI:  0.09 m MV E/A ratio:  0.75       1.5       Systemic Diam: 1.90 cm  Buford Dresser MD Electronically signed by Buford Dresser MD Signature Date/Time: 10/05/2019/3:23:25 PM    Final    VAS [place] LOWER EXTREMITY VENOUS (DVT)  Result Date: 10/06/2019   Lower Venous Study Indications: Positive d dimer.  Limitations: Body habitus, poor ultrasound/tissue interface and left BKA. Comparison Study: no prior Performing Technologist: Abram Sander RVS  Examination Guidelines: A complete evaluation includes B-mode imaging, spectral Doppler, color Doppler, and power Doppler as needed of all accessible portions of each vessel. Bilateral testing is considered an integral part of a complete examination. Limited examinations for reoccurring indications may be performed as noted.  +---------+---------------+---------+-----------+----------+--------------+ RIGHT    CompressibilityPhasicitySpontaneityPropertiesThrombus Aging +---------+---------------+---------+-----------+----------+--------------+ CFV      Full           Yes      Yes                                 +---------+---------------+---------+-----------+----------+--------------+ SFJ      Full                                                        +---------+---------------+---------+-----------+----------+--------------+ FV Prox  Full                                                        +---------+---------------+---------+-----------+----------+--------------+ FV Mid  Yes      Yes                                 +---------+---------------+---------+-----------+----------+--------------+ FV Distal                                             Not visualized +---------+---------------+---------+-----------+----------+--------------+ PFV      Full                                                        +---------+---------------+---------+-----------+----------+--------------+ POP      Full           Yes      Yes                                 +---------+---------------+---------+-----------+----------+--------------+ PTV      Full                                                         +---------+---------------+---------+-----------+----------+--------------+ PERO                                                  Not visualized +---------+---------------+---------+-----------+----------+--------------+   +---------+---------------+---------+-----------+----------+--------------+ LEFT     CompressibilityPhasicitySpontaneityPropertiesThrombus Aging +---------+---------------+---------+-----------+----------+--------------+ CFV      Full           Yes      Yes                                 +---------+---------------+---------+-----------+----------+--------------+ SFJ      Full                                                        +---------+---------------+---------+-----------+----------+--------------+ FV Prox  Full                                                        +---------+---------------+---------+-----------+----------+--------------+ FV Mid   Full                                                        +---------+---------------+---------+-----------+----------+--------------+ FV Distal               Yes  Yes                                 +---------+---------------+---------+-----------+----------+--------------+ PFV      Full                                                        +---------+---------------+---------+-----------+----------+--------------+ POP      Full           Yes      Yes                                 +---------+---------------+---------+-----------+----------+--------------+ PTV                                                   BKA            +---------+---------------+---------+-----------+----------+--------------+ PERO                                                  BKA            +---------+---------------+---------+-----------+----------+--------------+     Summary: Right: There is no evidence of deep vein thrombosis in the lower extremity. No cystic structure found in the  popliteal fossa. Left: There is no evidence of deep vein thrombosis in the lower extremity. However, portions of this examination were limited- see technologist comments above. No cystic structure found in the popliteal fossa.  *See table(s) above for measurements and observations. Electronically signed by Monica Martinez MD on 10/06/2019 at 4:20:38 PM.    Final     Microbiology: Recent Results (from the past 240 hour(s))  Culture, blood (routine x 2)     Status: Abnormal   Collection Time: 09/29/2019  2:27 AM   Specimen: BLOOD  Result Value Ref Range Status   Specimen Description   Final    BLOOD Performed at [facility] [address], [place], [place] [address]    Special Requests   Final    BOTTLES DRAWN AEROBIC AND ANAEROBIC Blood Culture results may not be optimal due to an inadequate volume of blood received in culture bottles Performed at [facility] [address] [place], [place] [address]    Culture  Setup Time   Final    AEROBIC BOTTLE ONLY GRAM POSITIVE COCCI CRITICAL RESULT CALLED TO, READ BACK BY AND VERIFIED WITH: L SEAY PHARMD 10/04/19 0027 JDW    Culture (A)  Final    STAPHYLOCOCCUS SPECIES (COAGULASE NEGATIVE) THE SIGNIFICANCE OF ISOLATING THIS ORGANISM FROM A SINGLE SET OF BLOOD CULTURES WHEN MULTIPLE SETS ARE DRAWN IS UNCERTAIN. PLEASE NOTIFY THE MICROBIOLOGY DEPARTMENT WITHIN ONE WEEK IF SPECIATION AND SENSITIVITIES ARE REQUIRED. Performed at [facility], [place] [address], [place], [place] [address]    Report Status 10/04/2019 FINAL  Final  Culture, blood (routine x 2)     Status: None (Preliminary result)   Collection Time: 09/16/2019  2:27 AM   Specimen: BLOOD  Result Value Ref Range Status   Specimen Description   Final    BLOOD SITE NOT SPECIFIED Performed at [facility] [address], [place], [place] [address]    Special Requests   Final    BOTTLES DRAWN AEROBIC AND ANAEROBIC Blood Culture results may  not be optimal due to an inadequate volume of blood received in culture bottles Performed at [facility] [address] [place], [place] [address]    Culture   Final    NO GROWTH 4 DAYS Performed at [facility], [place] [address] [place], [place] [address]    Report Status PENDING  Incomplete  Urine culture     Status: Abnormal   Collection Time: 09/19/2019  2:27 AM   Specimen: Urine, Clean Catch  Result Value Ref Range Status   Specimen Description   Final    URINE, CLEAN CATCH Performed at [facility], [place] [address] [place], [place] [address]    Special Requests   Final    NONE Performed at [facility], [place] [address] [place], [place] [address]    Culture (A)  Final    <10,000 COLONIES/mL INSIGNIFICANT GROWTH Performed at [facility] [address] [place], [place] [address]    Report Status 10/04/2019 FINAL  Final     Labs: Basic Metabolic Panel: Recent Labs  Lab 09/09/2019 0345 10/05/2019 0345 10/04/19 0302 10/04/19 0302 10/05/19 0310 10/05/19 0310 10/06/19 0440 2019-11-03 0337  NA 144  --  145  --  145  --  147* 146*  K 4.4   < > 4.1   < > 3.8   < > 4.5 4.5  CL 105  --  104  --  105  --  107 106  CO2 28  --  28  --  28  --  28 26  GLUCOSE 161*  --  235*  --  266*  --  77 198*  BUN 77*  --  91*  --  99*  --  106* 111*  CREATININE 2.54*  --  2.62*  --  2.74*  --  2.84* 3.04*  CALCIUM 8.0*  --  8.1*  --  8.1*  --  8.2* 8.3*  MG  --   --   --   --  2.2  --  2.4 2.3  PHOS  --   --   --   --  4.7*  --  4.7* 5.6*   < > = values in this interval not displayed.   Liver Function Tests: Recent Labs  Lab 10/02/2019 0345 10/04/19 0302 10/05/19 0310 10/06/19 0440 11/03/19 0337  AST 46* 39 30 26 24   ALT 48* 43 40 33 33  ALKPHOS 162* 149* 152* 153* 171*  BILITOT 0.6 0.7 0.9 0.9 0.6  PROT 5.6* 5.6* 5.2* 5.5* 5.8*  ALBUMIN 2.3* 2.2* 2.1* 2.2* 2.2*   No results for input(s): LIPASE, AMYLASE in the  last 168 hours. No results for input(s): AMMONIA in the last 168 hours. CBC: Recent Labs  Lab 09/27/2019 0345 10/04/19 0302 10/05/19 0310 10/06/19 0440 2019-11-03 0337  WBC 8.6 8.1 10.7* 12.1* 12.7*  NEUTROABS 7.9*  --   --  11.6* 12.3*  HGB 11.1* 11.2* 11.3* 12.1* 13.1  HCT 40.3 40.3 40.1 43.5 47.4  MCV 87.6 87.2 85.5 86.0 86.0  PLT 177 176 158 161 146*   Cardiac Enzymes: No results  for input(s): CKTOTAL, CKMB, CKMBINDEX, TROPONINI in the last 168 hours. D-Dimer Recent Labs    10/06/19 0440 2019/11/06 0337  DDIMER 9.32* 13.19*   BNP: Invalid input(s): POCBNP CBG: Recent Labs  Lab Nov 06, 2019 0115 11-06-19 0555 11/06/19 0805 2019-11-06 1141 2019-11-06 1553  GLUCAP 186* 174* 171* 201* 195*   Anemia work up Recent Labs    10/06/19 0440 11/06/2019 0337  FERRITIN 412* 524*   Urinalysis    Component Value Date/Time   COLORURINE YELLOW 10/05/2019 1230   APPEARANCEUR HAZY (A) 10/05/2019 1230   LABSPEC 1.015 10/05/2019 1230   PHURINE 5.0 10/05/2019 1230   GLUCOSEU NEGATIVE 10/05/2019 1230   HGBUR SMALL (A) 10/05/2019 1230   BILIRUBINUR NEGATIVE 10/05/2019 1230   KETONESUR NEGATIVE 10/05/2019 1230   PROTEINUR 30 (A) 10/05/2019 1230   NITRITE NEGATIVE 10/05/2019 1230   LEUKOCYTESUR NEGATIVE 10/05/2019 1230   Sepsis Labs Invalid input(s): PROCALCITONIN,  WBC,  LACTICIDVEN     SIGNED:  Vianne Bulls, MD  [facility] 06-Nov-2019, 10:05 PM Pager   If 7PM-7AM, please contact night-coverage [email or web address] Password [email or web address]

## 2019-10-10 NOTE — Progress Notes (Addendum)
PROGRESS NOTE    Darrell Mcclain  IHK:[id number] DOB: 08/08/1941 DOA: 10/09/2019 PCP: Burnard Bunting, MD   Brief Narrative:  79 year old male with DM 2, vascular dementia, chronic diastolic CHF, CKD 4, paroxysmal Darrell Mcclain. 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   Assessment & Plan:   Principal Problem:   COVID-19 Active Problems:   Chronic diastolic CHF (congestive heart failure), NYHA class 2 (HCC)   Uncontrolled diabetes mellitus (Darrell Mcclain)   Essential hypertension   Palliative care by specialist   Pleural effusion   Chronic respiratory failure with hypoxia (HCC)   Vascular dementia without behavioral disturbance (HCC)   AKI (acute kidney injury) ([place])   CKD (chronic kidney disease) stage 4, GFR 15-29 ml/min (HCC)   Pressure injury of skin   Acute respiratory failure with hypoxia (HCC)  Goals of care He's increasingly lethargic today with worsening respiratory status on 50 L HFNC.  No recorded PO intake over the past day.  Received high dose lasix yesterday without significant UOP and worsening creatinine today despite volume overload.  He's worsening and today appears to be declining further from Darrell Mcclain respiratory and mental status.  I discussed this with his daughter Darrell Mcclain) who we'd been talking to over the past few days with the assistance of palliative.  She noted his decline yesterday from 77 hrs prior when she facetimed before.  Discussed that I believe that he's dying and that at this point, Darrell Mcclain transition to comfort care is appropriate.  She was understanding of this plan.  She's hopeful to facetime again, her sister will likely facetime as well.  At this time, no plan for in person visit. Discussed with nursing.   Comfort orders  placed Expect an in hospital death, hours to days  Acute Hypoxic Respiratory Failure due to Covid-19 Viral Illness - on 50 L today, respiratory status worse - will transition to comfort measures at this time given above - he's declined despite steroids and remdesivir - not thought Darrell Mcclain good candidate for actemra given chronic effusion and concern for chronic osteo - on cefepime  - I/O, daily weights - received high dose lasix given volume overload without good output - IS, OOB, prone as able - LE [place] - no DVT  COVID-19 Labs  Recent Labs    10/05/19 0310 10/06/19 0440 November 02, 2019 0337  DDIMER 3.77* 9.32* 13.19*  FERRITIN  --  412* 524*  CRP 7.7* 7.1* 13.2*    Lab Results  Component Value Date   SARSCOV2NAA NEGATIVE 09/21/2019   Beatrice NEGATIVE 09/10/2019   Acute Heart Failure Exacerbation  Diastolic Heart Failure:  - crackles on exam, appears overloaded with diffuse edema - plan for comfort care - high dose lasix yesterday without significant improvement or UOP - I/O, daily weights - follow echo (dilated IVC with elevated RVSP, grade 1 diastolic dysfunction)  Right Sided Pleural Effusion: - seen by pulmonology on 1/5, noted to be chronic - recommended maintaining euvolemia and that he may need serial drainage - may need to discuss with pulm again given worsening hypoxia - follow with diuresis - I don't think drainage would change outcome at this point with worsening lethargy - plan for comfort as noted above  Acute Kidney Injury on Chronic kidney disease stage IV -baseline Creatinine ~ 1.2 -  1.7 -worsening creatinine after diuresis -given hypervolemia, will continue lasix -renal US from 1/9 without hydro, again without hydro 1/28 -follow UA and lytes - low renal sodium, prerenal, which supports heart failure as etiology -renal c/s, appreciate recs  Essential hypertension -blood pressure stable  Chronic pleural effusion, loculated -Monitor, afebrile, WNC  normal  Paroxysmal Darrell Mcclain fib / flutter, currently in sinus -Chronic Xarelto  Vascular dementia -stable  DM2  Hypoglycemia - change to q4 SSI, hold mealtime and basal insulin for now until pt PO intake improves  Pressure Ulcers and MASD: frequent turns, wound care c/s  Pressure Injury 09/10/19 Heel Right Deep Tissue Pressure Injury - Purple or maroon localized area of discolored intact skin or blood-filled blister due to damage of underlying soft tissue from pressure and/or shear. (Active)  09/10/19 1000  Location: Heel  Location Orientation: Right  Staging: Deep Tissue Pressure Injury - Purple or maroon localized area of discolored intact skin or blood-filled blister due to damage of underlying soft tissue from pressure and/or shear.  Wound Description (Comments):   Present on Admission:      Pressure Injury 09/16/2019 Sacrum Medial Stage 2 -  Partial thickness loss of dermis presenting as Darrell Mcclain shallow open injury with Darrell Mcclain red, pink wound bed without slough. open areas purple (Active)  09/09/2019 0930  Location: Sacrum  Location Orientation: Medial  Staging: Stage 2 -  Partial thickness loss of dermis presenting as Darrell Mcclain shallow open injury with Darrell Mcclain red, pink wound bed without slough.  Wound Description (Comments): open areas purple  Present on Admission: Yes   Coag negative staph in 1/4 blood cx from 1/25, likely contaminant  DVT prophylaxis: xarelto Code Status: DNR Family Communication: none at bedside - daughter 1/28 Disposition Plan:  . Patient came from:SNF            . Anticipated d/c place: in hospital death . Barriers to d/c OR conditions which need to be met to effect Darrell Mcclain safe d/c: comfort care   Consultants:   Palliative care  Procedures:   none  Antimicrobials: Anti-infectives (From admission, onward)   Start     Dose/Rate Route Frequency Ordered Stop   10/05/19 1930  ceFEPIme (MAXIPIME) 2 g in sodium chloride 0.9 % 100 mL IVPB  Status:  Discontinued     2 g 200 mL/hr  over 30 Minutes Intravenous Every 24 hours 10/05/19 1855 11-01-19 0959   10/04/19 1000  remdesivir 100 mg in sodium chloride 0.9 % 100 mL IVPB  Status:  Discontinued     100 mg 200 mL/hr over 30 Minutes Intravenous Daily 09/23/2019 0532 11/01/2019 0959   09/23/2019 0600  remdesivir 200 mg in sodium chloride 0.9% 250 mL IVPB     200 mg 580 mL/hr over 30 Minutes Intravenous Once 09/27/2019 0532 10/04/19 0720   09/10/2019 0400  vancomycin (VANCOREADY) IVPB 2000 mg/400 mL     2,000 mg 200 mL/hr over 120 Minutes Intravenous  Once 09/29/2019 0319 10/06/2019 0809   10/02/2019 0330  ceFEPIme (MAXIPIME) 2 g in sodium chloride 0.9 % 100 mL IVPB     2 g 200 mL/hr over 30 Minutes Intravenous  Once 09/24/2019 0315 10/01/2019 0436     Subjective: Denies SOB  Lethargic, answers some questions, but inconsistently I discussed with pt that he's very sick and I think he's dying.  He responded "I know" to this.  I discussed plan to transition him to comfort measures.  It was difficult to tell how much he was able to  understand from this conversation, but overall, seemed to understand and agree with comfort.  Objective: Vitals:   31-Oct-2019 0625 10-31-19 0706 2019/10/31 0747 10/31/19 0800  BP:  (!) 94/43 (!) 94/43 (!) 94/52  Pulse: 94 99 (!) 104 (!) 102  Resp: 13 11 (!) 22 17  Temp:    (!) 97.4 F (36.3 C)  TempSrc:    Axillary  SpO2: 92% 91% 91% 91%  Weight:      Height:        Intake/Output Summary (Last 24 hours) at 10/31/19 1000 Last data filed at 10/31/19 0600 Gross per 24 hour  Intake 638 ml  Output 650 ml  Net -12 ml   Filed Weights   09/16/2019 1626 10/05/19 1854 10/06/19 0500  Weight: 92.6 kg 100 kg 100.2 kg    Examination:  General: No acute distress, lethargic  Cardiovascular: Heart sounds show Kawhi Diebold regular rate, and rhythm.  Lungs: increased respiratory effort on HHFNC Abdomen: Soft, nontender, nondistended  Neurological: Lethargic.  Answers some questions, but inconsistently.  Skin: Warm and dry.  No rashes or lesions. Extremities: lower extremity edema    Data Reviewed: I have personally reviewed following labs and imaging studies  CBC: Recent Labs  Lab 09/21/2019 0345 10/04/19 0302 10/05/19 0310 10/06/19 0440 October 31, 2019 0337  WBC 8.6 8.1 10.7* 12.1* 12.7*  NEUTROABS 7.9*  --   --  11.6* 12.3*  HGB 11.1* 11.2* 11.3* 12.1* 13.1  HCT 40.3 40.3 40.1 43.5 47.4  MCV 87.6 87.2 85.5 86.0 86.0  PLT 177 176 158 161 592*   Basic Metabolic Panel: Recent Labs  Lab 10/09/2019 0345 10/04/19 0302 10/05/19 0310 10/06/19 0440 31-Oct-2019 0337  NA 144 145 145 147* 146*  K 4.4 4.1 3.8 4.5 4.5  CL 105 104 105 107 106  CO2 28 28 28 28 26   GLUCOSE 161* 235* 266* 77 198*  BUN 77* 91* 99* 106* 111*  CREATININE 2.54* 2.62* 2.74* 2.84* 3.04*  CALCIUM 8.0* 8.1* 8.1* 8.2* 8.3*  MG  --   --  2.2 2.4 2.3  PHOS  --   --  4.7* 4.7* 5.6*   GFR: Estimated Creatinine Clearance: 23.3 mL/min (Santana Gosdin) (by C-G formula based on SCr of 3.04 mg/dL (H)). Liver Function Tests: Recent Labs  Lab 09/24/2019 0345 10/04/19 0302 10/05/19 0310 10/06/19 0440 2019/10/31 0337  AST 46* 39 30 26 24   ALT 48* 43 40 33 33  ALKPHOS 162* 149* 152* 153* 171*  BILITOT 0.6 0.7 0.9 0.9 0.6  PROT 5.6* 5.6* 5.2* 5.5* 5.8*  ALBUMIN 2.3* 2.2* 2.1* 2.2* 2.2*   No results for input(s): LIPASE, AMYLASE in the last 168 hours. No results for input(s): AMMONIA in the last 168 hours. Coagulation Profile: Recent Labs  Lab 09/26/2019 0629  INR 1.5*   Cardiac Enzymes: No results for input(s): CKTOTAL, CKMB, CKMBINDEX, TROPONINI in the last 168 hours. BNP (last 3 results) No results for input(s): PROBNP in the last 8760 hours. HbA1C: No results for input(s): HGBA1C in the last 72 hours. CBG: Recent Labs  Lab 10/06/19 1625 10/06/19 2158 10/31/19 0115 October 31, 2019 0555 10/31/19 0805  GLUCAP 90 172* 186* 174* 171*   Lipid Profile: No results for input(s): CHOL, HDL, LDLCALC, TRIG, CHOLHDL, LDLDIRECT in the last 72 hours. Thyroid  Function Tests: No results for input(s): TSH, T4TOTAL, FREET4, T3FREE, THYROIDAB in the last 72 hours. Anemia Panel: Recent Labs    10/06/19 0440 10-31-19 0337  FERRITIN 412* 524*   Sepsis Labs: Recent Labs  Lab  09/28/2019 0227 10/09/2019 0544 10/05/19 2000 10/06/19 0440 2019/10/30 0337  PROCALCITON  --  1.07 0.73 0.63 0.55  LATICACIDVEN 1.0  1.1  --   --   --   --     Recent Results (from the past 240 hour(s))  Culture, blood (routine x 2)     Status: Abnormal   Collection Time: 09/16/2019  2:27 AM   Specimen: BLOOD  Result Value Ref Range Status   Specimen Description   Final    BLOOD Performed at [facility] [address], [place], [place] [address]    Special Requests   Final    BOTTLES DRAWN AEROBIC AND ANAEROBIC Blood Culture results may not be optimal due to an inadequate volume of blood received in culture bottles Performed at [facility] [address], [place], [place] [address]    Culture  Setup Time   Final    AEROBIC BOTTLE ONLY GRAM POSITIVE COCCI CRITICAL RESULT CALLED TO, READ BACK BY AND VERIFIED WITH: L SEAY PHARMD 10/04/19 0027 JDW    Culture (Juergen Hardenbrook)  Final    STAPHYLOCOCCUS SPECIES (COAGULASE NEGATIVE) THE SIGNIFICANCE OF ISOLATING THIS ORGANISM FROM Halo Laski SINGLE SET OF BLOOD CULTURES WHEN MULTIPLE SETS ARE DRAWN IS UNCERTAIN. PLEASE NOTIFY THE MICROBIOLOGY DEPARTMENT WITHIN ONE WEEK IF SPECIATION AND SENSITIVITIES ARE REQUIRED. Performed at [facility], [place] [address], [place], [place] [address]    Report Status 10/04/2019 FINAL  Final  Culture, blood (routine x 2)     Status: None (Preliminary result)   Collection Time: 09/10/2019  2:27 AM   Specimen: BLOOD  Result Value Ref Range Status   Specimen Description   Final    BLOOD SITE NOT SPECIFIED Performed at [facility] [address] [place], [place] [address]    Special Requests   Final    BOTTLES DRAWN AEROBIC AND ANAEROBIC Blood Culture results may  not be optimal due to an inadequate volume of blood received in culture bottles Performed at [facility] [address], [place], [place] [address]    Culture   Final    NO GROWTH 4 DAYS Performed at [facility], [place] [address], [place], [place] [address]    Report Status PENDING  Incomplete  Urine culture     Status: Abnormal   Collection Time: 09/28/2019  2:27 AM   Specimen: Urine, Clean Catch  Result Value Ref Range Status   Specimen Description   Final    URINE, CLEAN CATCH Performed at [facility], [place] [address], [place], [place] [address]    Special Requests   Final    NONE Performed at [facility], [place] [address], [place], [place] [address]    Culture (Jame Seelig)  Final    <10,000 COLONIES/mL INSIGNIFICANT GROWTH Performed at [facility] [address], [place],  [address]    Report Status 10/04/2019 FINAL  Final         Radiology Studies: US RENAL  Result Date: 10/06/2019 CLINICAL DATA:  Acute kidney injury EXAM: RENAL / URINARY TRACT ULTRASOUND COMPLETE COMPARISON:  Renal ultrasound 09/17/2019 FINDINGS: Right Kidney: Renal measurements: 12.1 x 5.6 x 6.4 cm = volume: 225 mL. Increased echogenicity of the cortex, with significant progression. No obstruction or mass. Left Kidney: Renal measurements: 12.1 x 5.6 x 5.0 cm = volume: 169 mL. Increased echogenicity liver, with progression from the prior study. No obstruction or mass. Bladder: Foley  catheter. Small amount of urine in the bladder without focal abnormality. Other: Small amount of free fluid around the liver. IMPRESSION: Negative for hydronephrosis. Progressive hyperechoic renal cortex bilaterally compared with the recent study. Electronically Signed   By: Franchot Gallo M.D.   On: 10/06/2019 09:20   DG CHEST PORT 1 VIEW  Result Date: 10/05/2019 CLINICAL DATA:  79 year old male with history of hypoxia. EXAM: PORTABLE CHEST 1 VIEW COMPARISON:   Chest x-ray 10/05/2019. FINDINGS: Patchy multifocal ill-defined opacities and areas of interstitial prominence scattered throughout the lungs bilaterally, most evident throughout the mid to lower lungs. Overall, aeration is essentially unchanged compared to the prior study. Moderate right pleural effusion is unchanged. No left pleural effusion. No pneumothorax. Heart size is mildly enlarged. Upper mediastinal contours are distorted by patient positioning. Aortic atherosclerosis. IMPRESSION: 1. Severe multilobar bilateral pneumonia with moderate right pleural effusion, unchanged. 2. Aortic atherosclerosis. Electronically Signed   By: [name] M.D.   On: 10/05/2019 16:15   ECHOCARDIOGRAM COMPLETE  Result Date: 10/05/2019   ECHOCARDIOGRAM REPORT   Patient Name:   TAARIQ LEITZ Date of Exam: 10/05/2019 Medical Rec #:  [id number]       Height:       67.0 in Accession #:    [id number]      Weight:       204.1 lb Date of Birth:  [birth year]        BSA:          2.04 m Patient Age:    66 years        BP:           121/71 mmHg Patient Gender: M               HR:           93 bpm. Exam Location:  Inpatient Procedure: 2D Echo Indications:    Volume overload [[id number]]  History:        Patient has prior history of Echocardiogram examinations, most                 recent 08/23/2015. Risk Factors:Hypertension, Diabetes and                 Dyslipidemia. Vascular dementia. chronic kidney disease. Covid                 19.  Sonographer:    Darlina Sicilian RDCS Referring Phys: [phone] Eschol Auxier CALDWELL POWELL JR  Sonographer Comments: [place]  1. Left ventricular ejection fraction, by visual estimation, is 60 to 65%. The left ventricle has normal function. There is moderately increased left ventricular hypertrophy.  2. Left ventricular diastolic parameters are consistent with Grade I diastolic dysfunction (impaired relaxation).  3. The left ventricle has no regional wall motion abnormalities.  4. Global right  ventricle was not well visualized.The right ventricular size is not well visualized. Not well visualized.  5. Left atrial size was moderately dilated.  6. Right atrial size was not well visualized.  7. Trivial pericardial effusion is present.  8. The mitral valve is normal in structure. Mild to moderate eccentric mitral valve regurgitation.  9. The tricuspid valve is normal in structure. 10. The tricuspid valve is normal in structure. Tricuspid valve regurgitation is moderate. 11. The aortic valve has an indeterminant number of cusps. Aortic valve regurgitation is mild. 12. The pulmonic valve was not well visualized. Pulmonic valve regurgitation is not visualized. 13. The aortic arch was not well visualized and the  ascending aorta was not well visualized. 14. Severely elevated pulmonary artery systolic pressure. 15. The tricuspid regurgitant velocity is 3.67 m/s, and with an assumed right atrial pressure of 15 mmHg, the estimated right ventricular systolic pressure is severely elevated at 68.9 mmHg. 16. The inferior vena cava is dilated in size with <50% respiratory variability, suggesting right atrial pressure of 15 mmHg. 17. The atrial septum is grossly normal. FINDINGS  Left Ventricle: Left ventricular ejection fraction, by visual estimation, is 60 to 65%. The left ventricle has normal function. The left ventricle has no regional wall motion abnormalities. There is moderately increased left ventricular hypertrophy. Concentric left ventricular hypertrophy. Left ventricular diastolic parameters are consistent with Grade I diastolic dysfunction (impaired relaxation). Right Ventricle: The right ventricular size is not well visualized. Not well visualized. Global RV systolic function is was not well visualized. The tricuspid regurgitant velocity is 3.67 m/s, and with an assumed right atrial pressure of 15 mmHg, the estimated right ventricular systolic pressure is severely elevated at 68.9 mmHg. Left Atrium: Left  atrial size was moderately dilated. Right Atrium: Right atrial size was not well visualized Pericardium: Trivial pericardial effusion is present. Mitral Valve: The mitral valve is normal in structure. Mild to moderate eccentric mitral valve regurgitation. Tricuspid Valve: The tricuspid valve is normal in structure. Tricuspid valve regurgitation is moderate. Aortic Valve: The aortic valve has an indeterminant number of cusps. Aortic valve regurgitation is mild. Pulmonic Valve: The pulmonic valve was not well visualized. Pulmonic valve regurgitation is not visualized. Pulmonic regurgitation is not visualized. Aorta: The aortic root is normal in size and structure, the aortic arch was not well visualized and the ascending aorta was not well visualized. Pulmonary Artery: The pulmonary artery is not well seen. Venous: The inferior vena cava is dilated in size with less than 50% respiratory variability, suggesting right atrial pressure of 15 mmHg. IAS/Shunts: The atrial septum is grossly normal.  LEFT VENTRICLE PLAX 2D LVIDd:         4.49 cm  Diastology LVIDs:         2.86 cm  LV e' lateral:   5.55 cm/s LV PW:         1.31 cm  LV E/e' lateral: 8.0 LV IVS:        1.43 cm  LV e' medial:    3.37 cm/s LVOT diam:     1.90 cm  LV E/e' medial:  13.2 LV SV:         61 ml LV SV Index:   28.65 LVOT Area:     2.84 cm  LEFT ATRIUM              Index LA diam:        3.90 cm  1.91 cm/m LA Vol (A2C):   51.6 ml  25.29 ml/m LA Vol (A4C):   109.0 ml 53.43 ml/m LA Biplane Vol: 81.1 ml  39.76 ml/m  AORTIC VALVE LVOT Vmax:   73.90 cm/s LVOT Vmean:  47.300 cm/s LVOT VTI:    0.086 m  AORTA Ao Root diam: 3.20 cm MITRAL VALVE                        TRICUSPID VALVE MV Area (PHT): 2.62 cm             TR Peak grad:   53.9 mmHg MV PHT:        83.81 msec  TR Vmax:        389.00 cm/s MV Decel Time: 289 msec MV E velocity: 44.60 cm/s 103 cm/s  SHUNTS MV Antino Mayabb velocity: 59.20 cm/s 70.3 cm/s Systemic VTI:  0.09 m MV E/Ladanian Kelter ratio:  0.75       1.5        Systemic Diam: 1.90 cm  Buford Dresser MD Electronically signed by Buford Dresser MD Signature Date/Time: 10/05/2019/3:23:25 PM    Final    VAS [place] LOWER EXTREMITY VENOUS (DVT)  Result Date: 10/06/2019  Lower Venous Study Indications: Positive d dimer.  Limitations: Body habitus, poor ultrasound/tissue interface and left BKA. Comparison Study: no prior Performing Technologist: Abram Sander RVS  Examination Guidelines: Zayvier Caravello complete evaluation includes B-mode imaging, spectral Doppler, color Doppler, and power Doppler as needed of all accessible portions of each vessel. Bilateral testing is considered an integral part of Noralyn Karim complete examination. Limited examinations for reoccurring indications may be performed as noted.  +---------+---------------+---------+-----------+----------+--------------+ RIGHT    CompressibilityPhasicitySpontaneityPropertiesThrombus Aging +---------+---------------+---------+-----------+----------+--------------+ CFV      Full           Yes      Yes                                 +---------+---------------+---------+-----------+----------+--------------+ SFJ      Full                                                        +---------+---------------+---------+-----------+----------+--------------+ FV Prox  Full                                                        +---------+---------------+---------+-----------+----------+--------------+ FV Mid                  Yes      Yes                                 +---------+---------------+---------+-----------+----------+--------------+ FV Distal                                             Not visualized +---------+---------------+---------+-----------+----------+--------------+ PFV      Full                                                        +---------+---------------+---------+-----------+----------+--------------+ POP      Full           Yes      Yes                                  +---------+---------------+---------+-----------+----------+--------------+ PTV      Full                                                        +---------+---------------+---------+-----------+----------+--------------+  PERO                                                  Not visualized +---------+---------------+---------+-----------+----------+--------------+   +---------+---------------+---------+-----------+----------+--------------+ LEFT     CompressibilityPhasicitySpontaneityPropertiesThrombus Aging +---------+---------------+---------+-----------+----------+--------------+ CFV      Full           Yes      Yes                                 +---------+---------------+---------+-----------+----------+--------------+ SFJ      Full                                                        +---------+---------------+---------+-----------+----------+--------------+ FV Prox  Full                                                        +---------+---------------+---------+-----------+----------+--------------+ FV Mid   Full                                                        +---------+---------------+---------+-----------+----------+--------------+ FV Distal               Yes      Yes                                 +---------+---------------+---------+-----------+----------+--------------+ PFV      Full                                                        +---------+---------------+---------+-----------+----------+--------------+ POP      Full           Yes      Yes                                 +---------+---------------+---------+-----------+----------+--------------+ PTV                                                   BKA            +---------+---------------+---------+-----------+----------+--------------+ PERO                                                  BKA             +---------+---------------+---------+-----------+----------+--------------+  Summary: Right: There is no evidence of deep vein thrombosis in the lower extremity. No cystic structure found in the popliteal fossa. Left: There is no evidence of deep vein thrombosis in the lower extremity. However, portions of this examination were limited- see technologist comments above. No cystic structure found in the popliteal fossa.  *See table(s) above for measurements and observations. Electronically signed by Monica Martinez MD on 10/06/2019 at 4:20:38 PM.    Final         Scheduled Meds: . albuterol  2 puff Inhalation Q6H  . Chlorhexidine Gluconate Cloth  6 each Topical Daily  . dorzolamide  1 drop Both Eyes BID  . liver oil-zinc oxide   Topical QID  . nystatin cream   Topical BID   Continuous Infusions:    LOS: 4 days    Time spent: over 30 min    Fayrene Helper, MD [facility]   To contact the attending provider between 7A-7P or the covering provider during after hours 7P-7A, please log into the web site [email or web address] and access using universal [place] password for that web site. If you do not have the password, please call the hospital operator.  Oct 08, 2019, 10:00 AM

## 2019-10-10 NOTE — Progress Notes (Signed)
Palliative Care Brief Note  Discussed with Dr. [name] this AM.  Patient transitioning to comfort measure.  No further needs from our team at this point.  As such, we will not follow daily but are happy to assist with any needs as they arise.  Please call if there are specific areas with which are team can be of assistance.  Micheline Rough, MD [facility] [phone]  NO CHARGE NOTE

## 2019-10-10 NOTE — Progress Notes (Signed)
Called and notified [name] about pt death. Organ services was notified and body was released, entered funeral home information as provided by [name], Daughter. No personal possessions at bedside. Verified with Lindwood Coke, RN.

## 2019-10-10 DEATH — deceased

## 2019-11-23 NOTE — Telephone Encounter (Signed)
error 

## 2020-04-28 IMAGING — US US RENAL
1 series · 14 of 25 positions shown · non-contrast
Comparison: CT of the pelvis dated 10/20/2018

CLINICAL DATA: Acute renal failure

EXAM:
RENAL / URINARY TRACT ULTRASOUND COMPLETE

[Series 1: us renal · 14 of 44 slices shown]
[im 1/44]
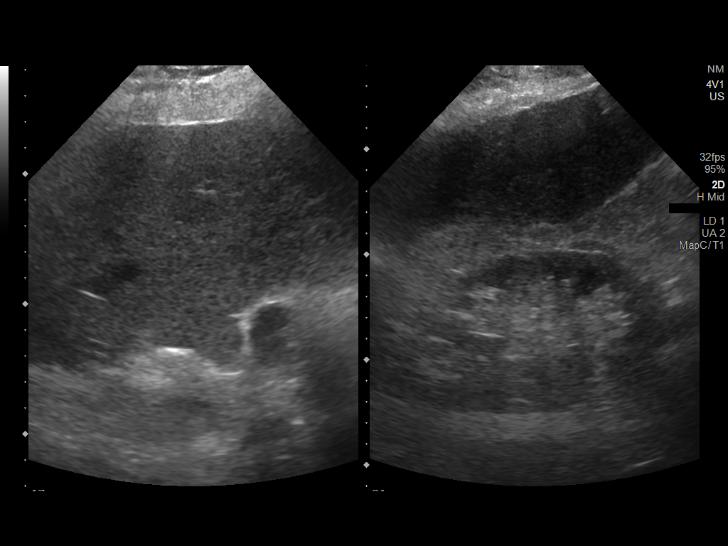
[im 4/44]
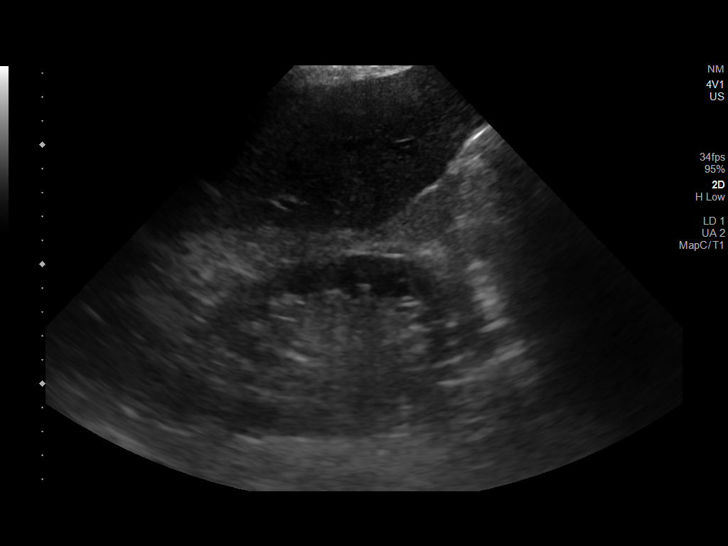
[im 8/44]
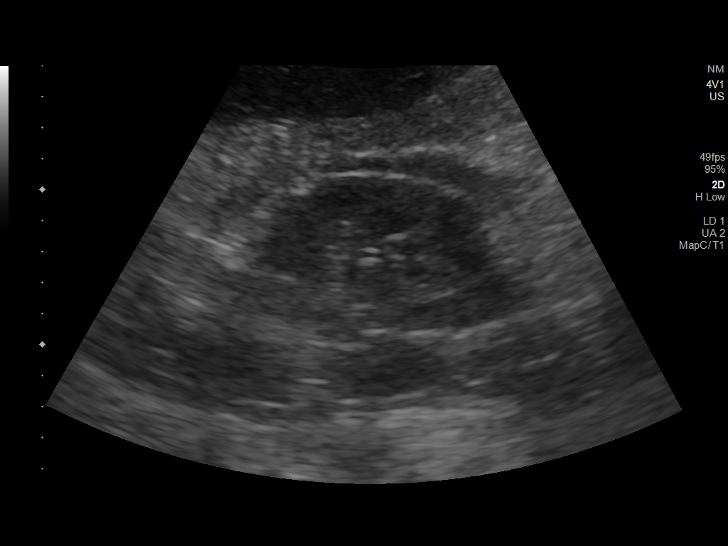
[im 11/44]
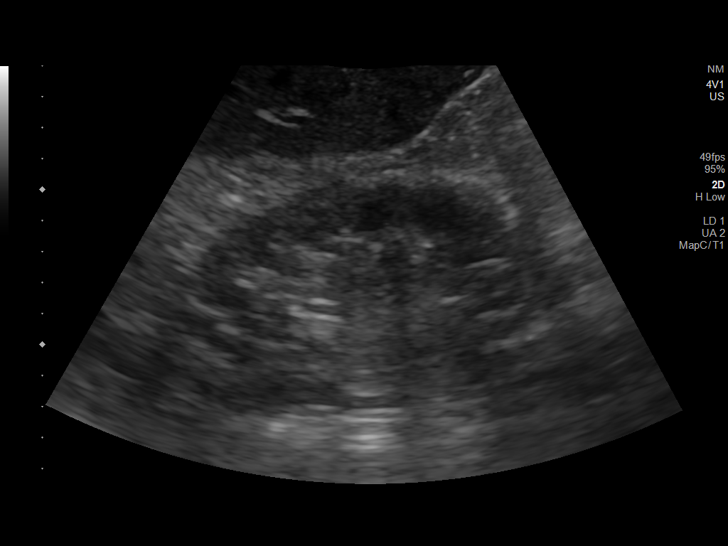
[im 15/44]
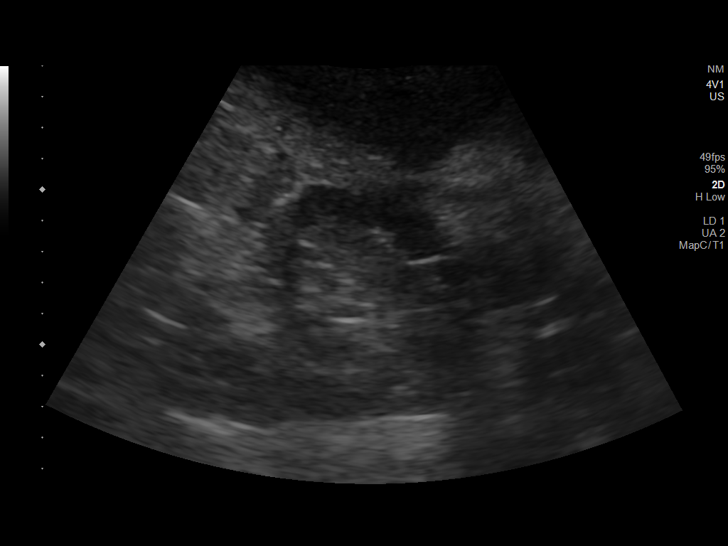
[im 17/44]
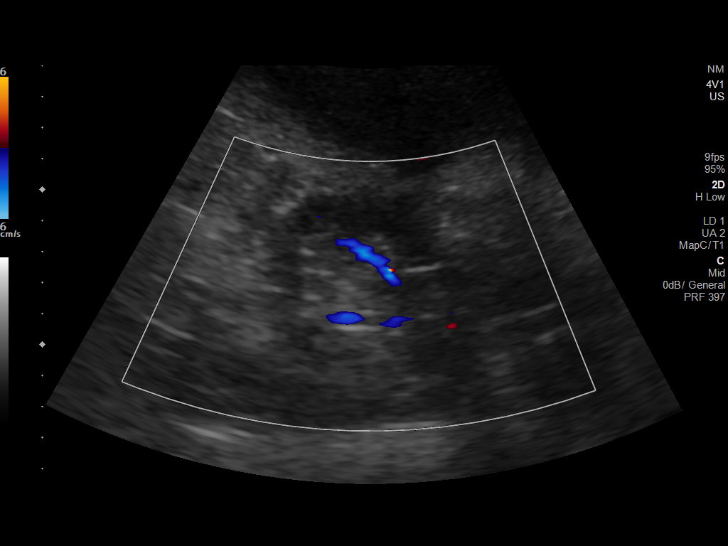
[im 20/44]
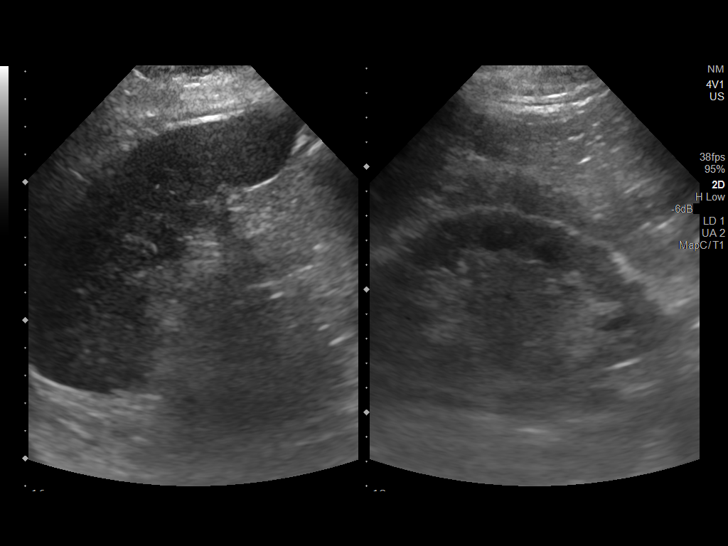
[im 24/44]
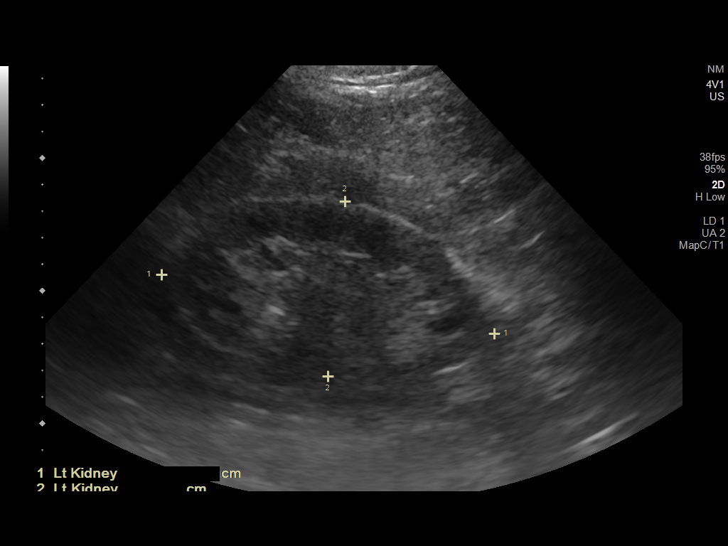
[im 27/44]
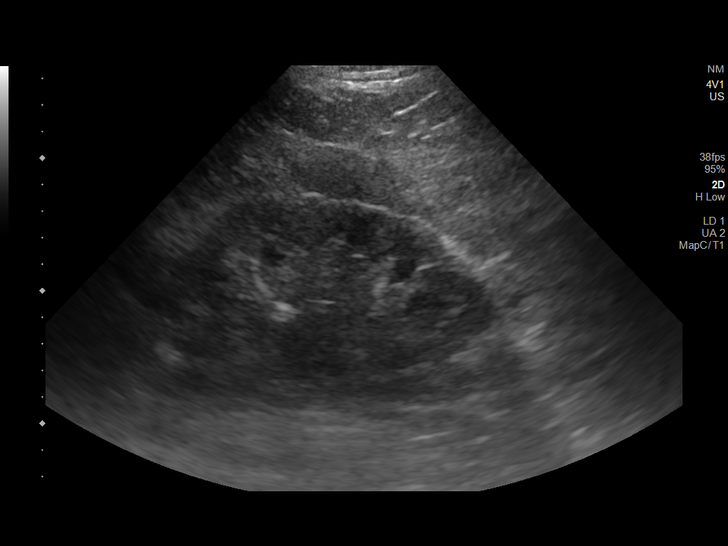
[im 29/44]
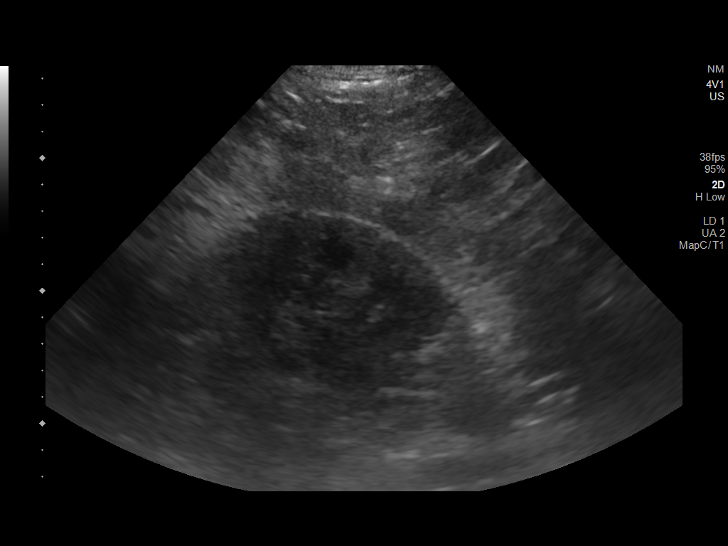
[im 33/44]
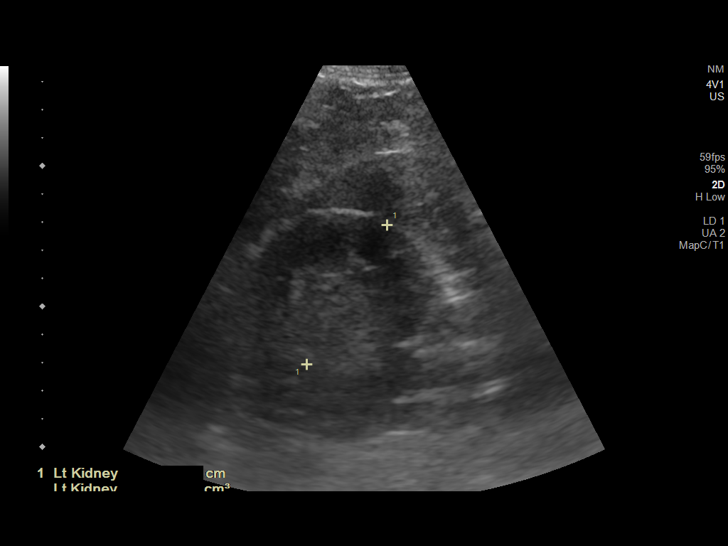
[im 36/44]
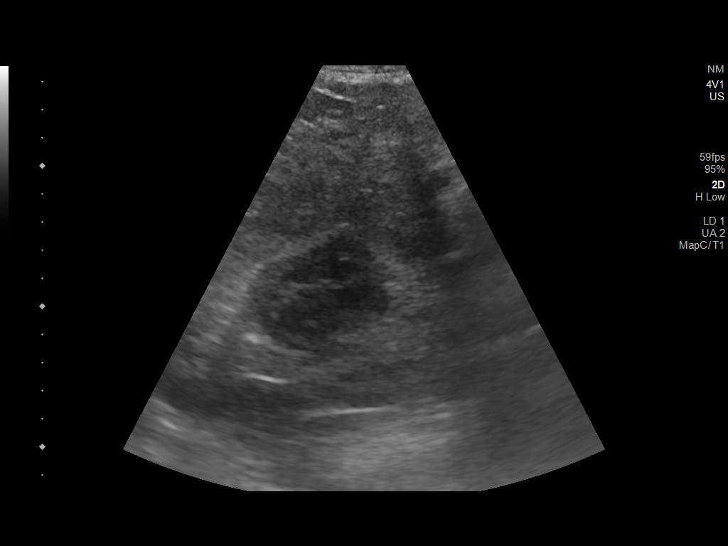
[im 40/44]
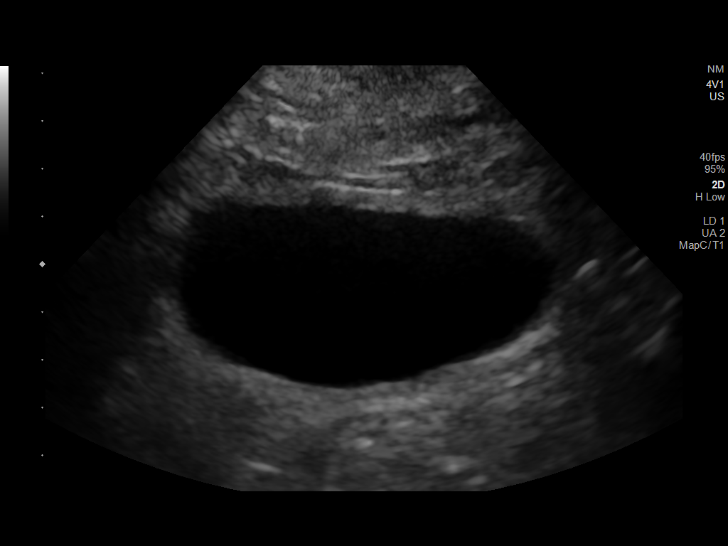
[im 44/44]
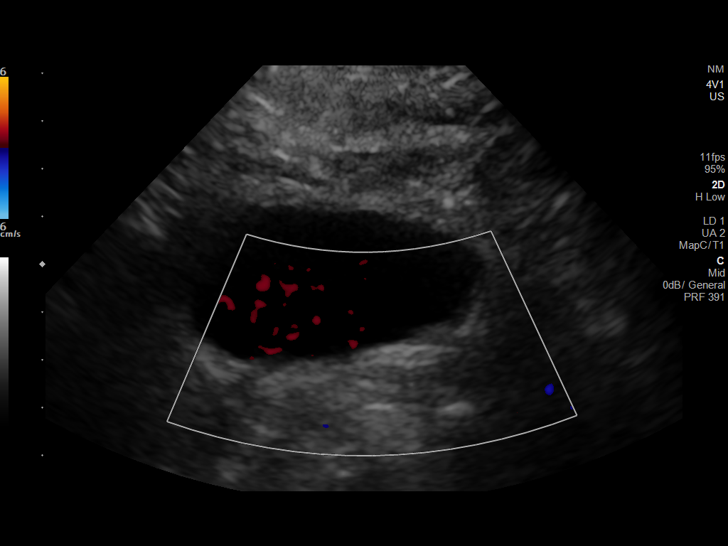

[14 of 25 positions shown; findings below may reference images not displayed]

FINDINGS: Right Kidney:

Renal measurements: 10.6 x 5.4 x 5.3 cm = volume: 159 mL. There is
no hydronephrosis. There is increased cortical echogenicity.

Left Kidney:

Renal measurements: 12.8 x 6.6 x 5.7 cm 252 = volume: 252 mL. There
is no hydronephrosis. There is increased cortical echogenicity.

Bladder:

Appears normal for degree of bladder distention. Both ureteral jets
were visualized.

Other:

None.
IMPRESSION: 1. No acute abnormality.  No evidence for hydronephrosis.
2. Echogenic kidneys bilaterally which can be seen in patients with
medical renal disease.

## 2020-05-17 IMAGING — US US RENAL
1 series · 14 of 25 positions shown · non-contrast
Comparison: Renal ultrasound 09/17/2019

CLINICAL DATA: Acute kidney injury

EXAM:
RENAL / URINARY TRACT ULTRASOUND COMPLETE

[Series 1: us renal · 0.27mm/px · 14 of 121 slices shown]
[im 1/121]
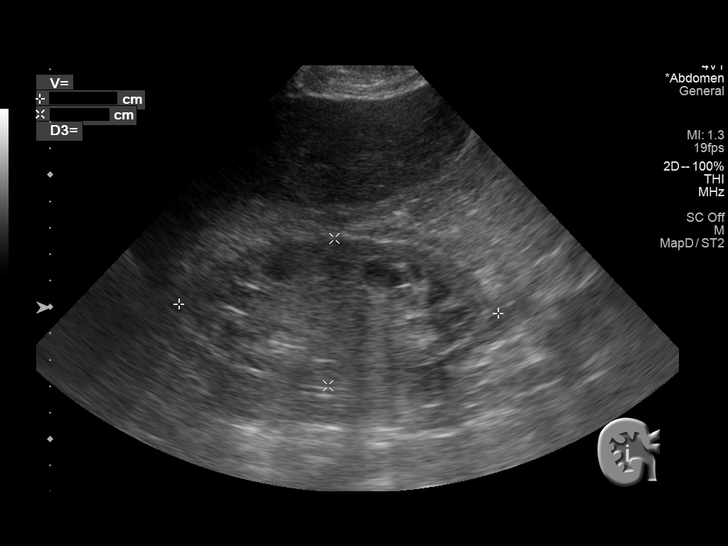
[im 11/121]
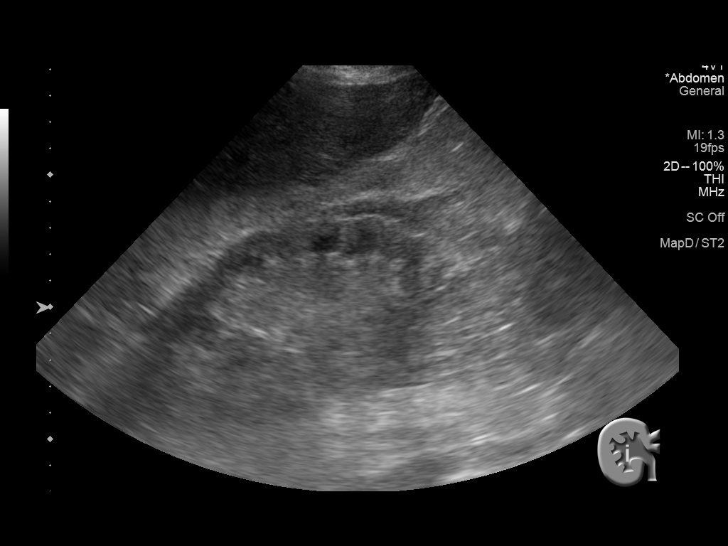
[im 21/121]
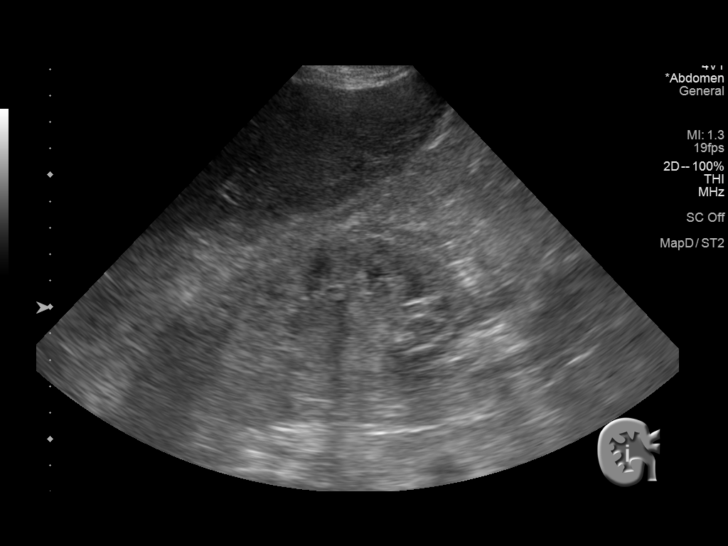
[im 31/121]
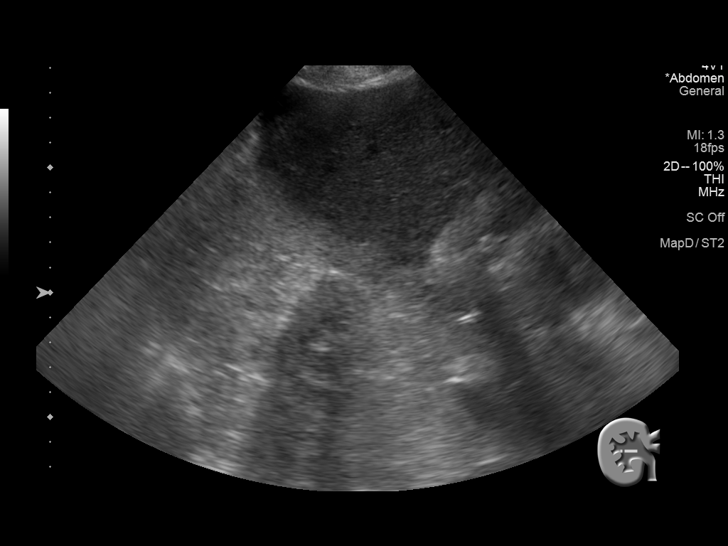
[im 41/121]
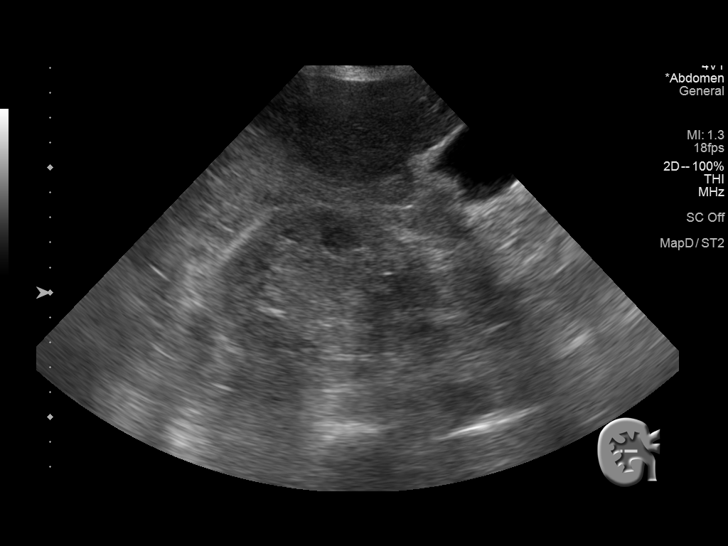
[im 46/121]
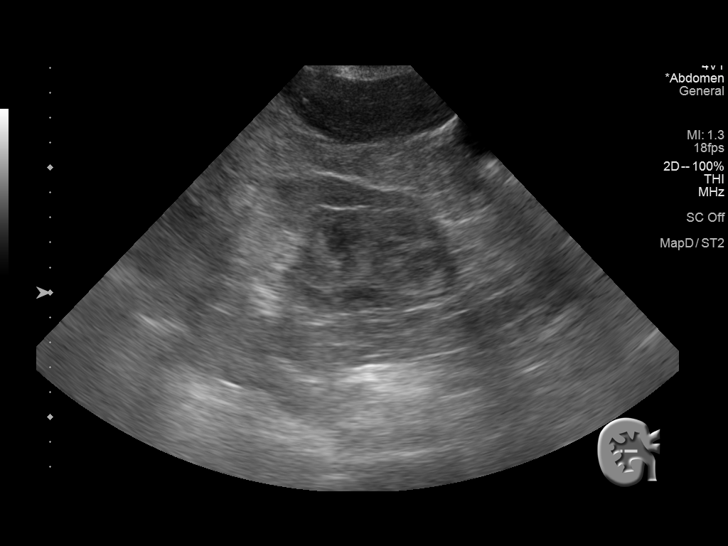
[im 56/121]
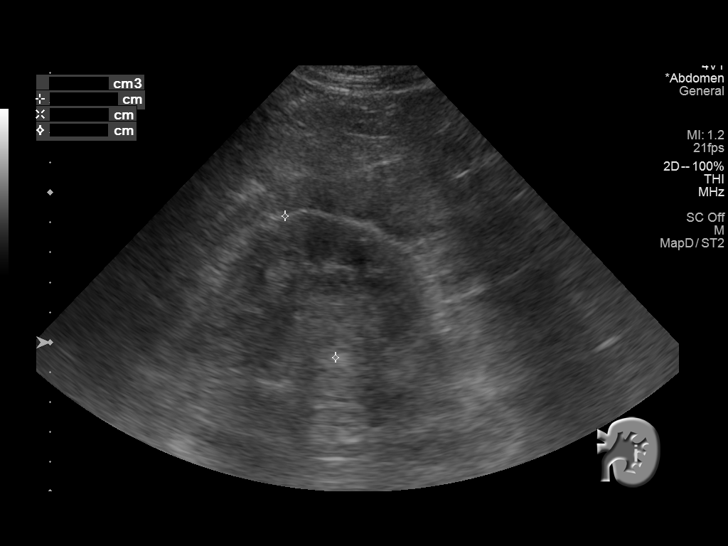
[im 66/121]
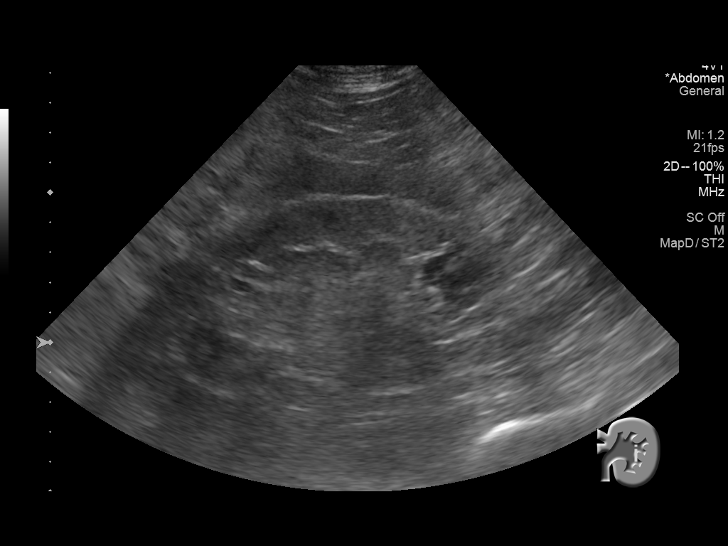
[im 76/121]
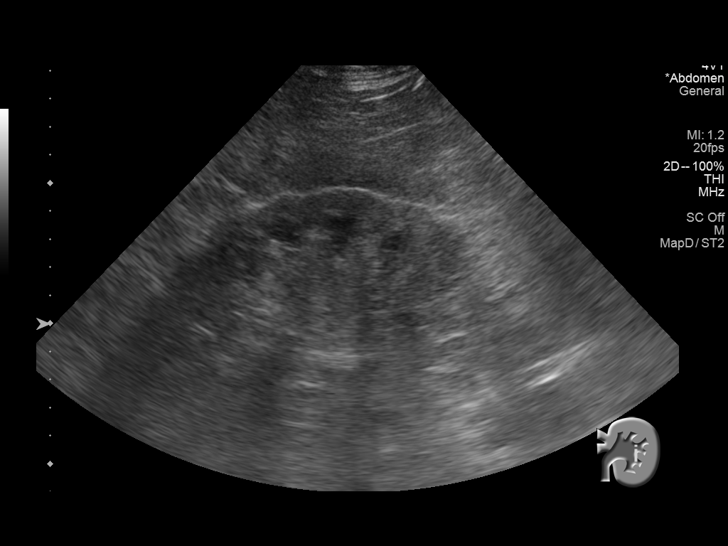
[im 81/121]
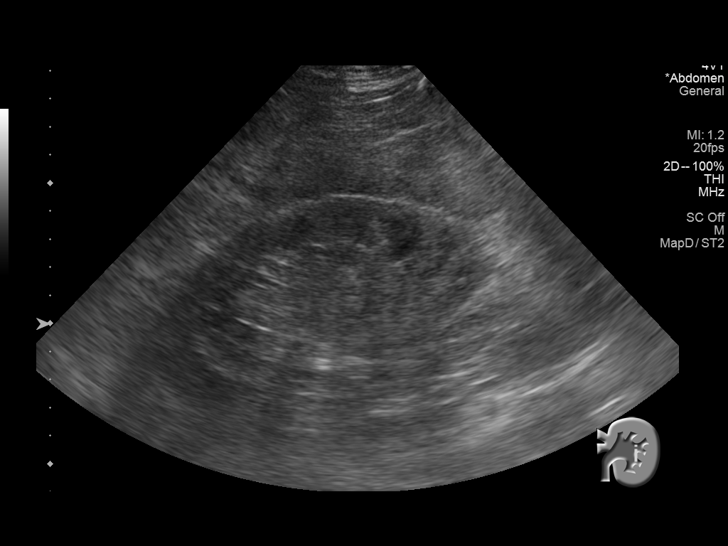
[im 91/121]
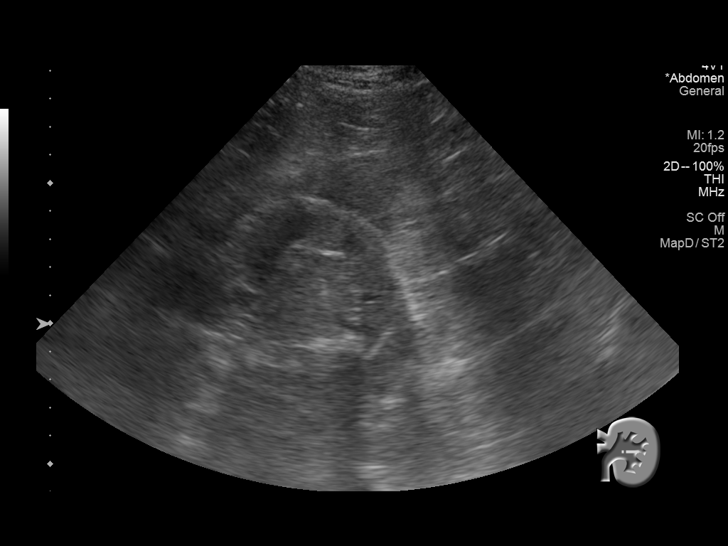
[im 101/121]
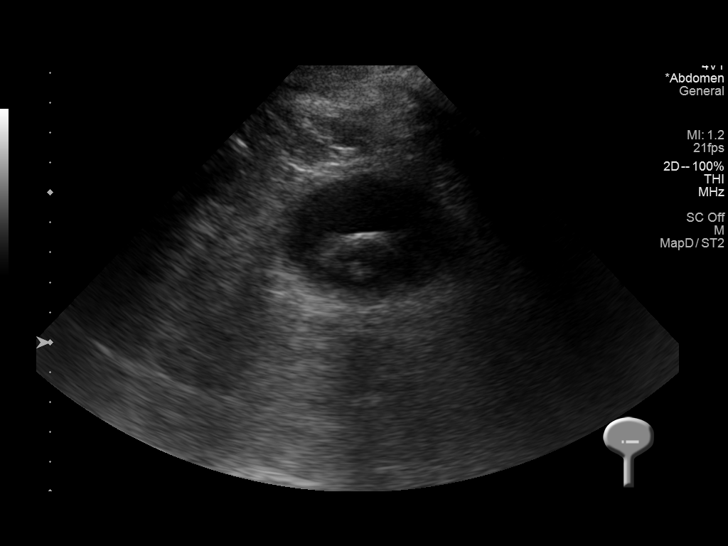
[im 111/121]
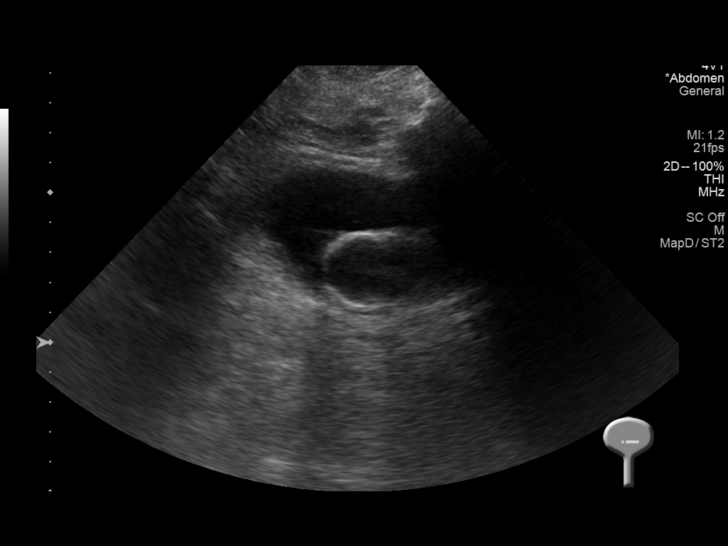
[im 121/121]
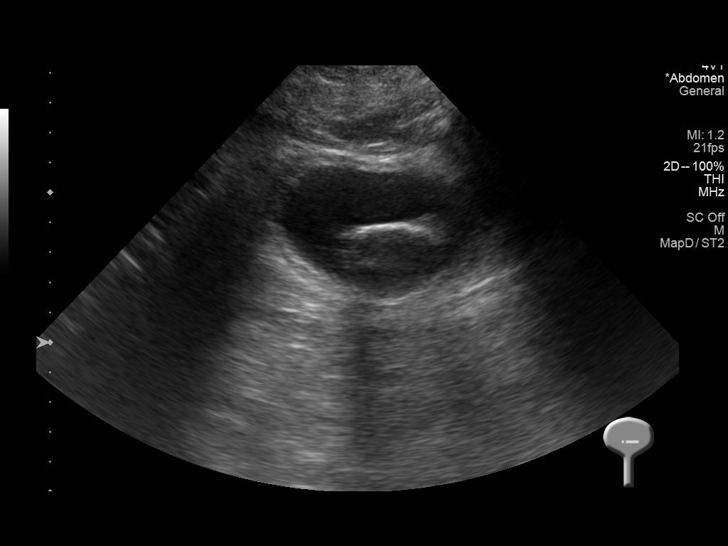

[14 of 25 positions shown; findings below may reference images not displayed]

FINDINGS: Right Kidney:

Renal measurements: 12.1 x 5.6 x 6.4 cm = volume: 225 mL. Increased
echogenicity of the cortex, with significant progression. No
obstruction or mass.

Left Kidney:

Renal measurements: 12.1 x 5.6 x 5.0 cm = volume: 169 mL. Increased
echogenicity liver, with progression from the prior study. No
obstruction or mass.

Bladder:

Foley catheter. Small amount of urine in the bladder without focal
abnormality.

Other:

Small amount of free fluid around the liver.
IMPRESSION: Negative for hydronephrosis. Progressive hyperechoic renal cortex
bilaterally compared with the recent study.
